# Patient Record
Sex: Male | Born: 1955 | Race: White | Hispanic: No | Marital: Married | Smoking: Current every day smoker
Health system: Southern US, Community
[De-identification: ages and names within clinical notes are randomized; demographics above are authoritative.]

## PROBLEM LIST (undated history)

## (undated) ENCOUNTER — Emergency Department (HOSPITAL_COMMUNITY): Admission: EM | Payer: Medicare Other | Source: Home / Self Care

## (undated) DIAGNOSIS — G8929 Other chronic pain: Secondary | ICD-10-CM

## (undated) DIAGNOSIS — M1990 Unspecified osteoarthritis, unspecified site: Secondary | ICD-10-CM

## (undated) DIAGNOSIS — J189 Pneumonia, unspecified organism: Secondary | ICD-10-CM

## (undated) DIAGNOSIS — R0600 Dyspnea, unspecified: Secondary | ICD-10-CM

## (undated) DIAGNOSIS — T7840XA Allergy, unspecified, initial encounter: Secondary | ICD-10-CM

## (undated) DIAGNOSIS — J449 Chronic obstructive pulmonary disease, unspecified: Secondary | ICD-10-CM

## (undated) DIAGNOSIS — G4700 Insomnia, unspecified: Secondary | ICD-10-CM

## (undated) DIAGNOSIS — I10 Essential (primary) hypertension: Secondary | ICD-10-CM

## (undated) DIAGNOSIS — E785 Hyperlipidemia, unspecified: Secondary | ICD-10-CM

## (undated) DIAGNOSIS — K219 Gastro-esophageal reflux disease without esophagitis: Secondary | ICD-10-CM

## (undated) HISTORY — PX: TOE SURGERY: SHX1073

## (undated) HISTORY — DX: Essential (primary) hypertension: I10

## (undated) HISTORY — PX: SHOULDER SURGERY: SHX246

## (undated) HISTORY — DX: Hyperlipidemia, unspecified: E78.5

## (undated) HISTORY — DX: Insomnia, unspecified: G47.00

## (undated) HISTORY — DX: Other chronic pain: G89.29

## (undated) HISTORY — DX: Allergy, unspecified, initial encounter: T78.40XA

## (undated) HISTORY — DX: Gastro-esophageal reflux disease without esophagitis: K21.9

---

## 1998-05-23 ENCOUNTER — Encounter: Admission: RE | Admit: 1998-05-23 | Discharge: 1998-08-21 | Payer: Self-pay | Admitting: Family Medicine

## 2002-07-17 ENCOUNTER — Emergency Department (HOSPITAL_COMMUNITY): Admission: EM | Admit: 2002-07-17 | Discharge: 2002-07-17 | Payer: Self-pay | Admitting: *Deleted

## 2002-10-26 ENCOUNTER — Encounter: Payer: Self-pay | Admitting: Emergency Medicine

## 2002-10-26 ENCOUNTER — Emergency Department (HOSPITAL_COMMUNITY): Admission: EM | Admit: 2002-10-26 | Discharge: 2002-10-26 | Payer: Self-pay | Admitting: Emergency Medicine

## 2003-07-27 ENCOUNTER — Emergency Department (HOSPITAL_COMMUNITY): Admission: EM | Admit: 2003-07-27 | Discharge: 2003-07-27 | Payer: Self-pay | Admitting: Emergency Medicine

## 2004-01-19 ENCOUNTER — Emergency Department (HOSPITAL_COMMUNITY): Admission: EM | Admit: 2004-01-19 | Discharge: 2004-01-19 | Payer: Self-pay | Admitting: Emergency Medicine

## 2004-04-30 ENCOUNTER — Emergency Department (HOSPITAL_COMMUNITY): Admission: EM | Admit: 2004-04-30 | Discharge: 2004-04-30 | Payer: Self-pay | Admitting: Emergency Medicine

## 2005-08-16 ENCOUNTER — Emergency Department (HOSPITAL_COMMUNITY): Admission: EM | Admit: 2005-08-16 | Discharge: 2005-08-16 | Payer: Self-pay | Admitting: Emergency Medicine

## 2007-04-21 ENCOUNTER — Emergency Department (HOSPITAL_COMMUNITY): Admission: EM | Admit: 2007-04-21 | Discharge: 2007-04-21 | Payer: Self-pay | Admitting: Emergency Medicine

## 2009-01-19 ENCOUNTER — Emergency Department (HOSPITAL_BASED_OUTPATIENT_CLINIC_OR_DEPARTMENT_OTHER): Admission: EM | Admit: 2009-01-19 | Discharge: 2009-01-19 | Payer: Self-pay | Admitting: Emergency Medicine

## 2009-02-07 ENCOUNTER — Ambulatory Visit: Payer: Self-pay | Admitting: Radiology

## 2009-02-07 ENCOUNTER — Emergency Department (HOSPITAL_BASED_OUTPATIENT_CLINIC_OR_DEPARTMENT_OTHER): Admission: EM | Admit: 2009-02-07 | Discharge: 2009-02-07 | Payer: Self-pay | Admitting: Emergency Medicine

## 2009-03-24 ENCOUNTER — Emergency Department (HOSPITAL_BASED_OUTPATIENT_CLINIC_OR_DEPARTMENT_OTHER): Admission: EM | Admit: 2009-03-24 | Discharge: 2009-03-24 | Payer: Self-pay | Admitting: Emergency Medicine

## 2009-04-20 ENCOUNTER — Emergency Department (HOSPITAL_COMMUNITY): Admission: EM | Admit: 2009-04-20 | Discharge: 2009-04-20 | Payer: Self-pay | Admitting: Family Medicine

## 2009-04-28 ENCOUNTER — Emergency Department (HOSPITAL_COMMUNITY): Admission: EM | Admit: 2009-04-28 | Discharge: 2009-04-28 | Payer: Self-pay | Admitting: Emergency Medicine

## 2009-05-28 ENCOUNTER — Emergency Department (HOSPITAL_BASED_OUTPATIENT_CLINIC_OR_DEPARTMENT_OTHER): Admission: EM | Admit: 2009-05-28 | Discharge: 2009-05-28 | Payer: Self-pay | Admitting: Emergency Medicine

## 2009-06-12 DEATH — deceased

## 2009-06-20 ENCOUNTER — Ambulatory Visit: Payer: Self-pay | Admitting: Family Medicine

## 2009-07-01 ENCOUNTER — Encounter: Payer: Self-pay | Admitting: Family Medicine

## 2009-07-01 ENCOUNTER — Ambulatory Visit: Payer: Self-pay | Admitting: Family Medicine

## 2009-07-01 LAB — CONVERTED CEMR LAB
ALT: 28 units/L (ref 0–53)
AST: 22 units/L (ref 0–37)
Albumin: 4.2 g/dL (ref 3.5–5.2)
Alkaline Phosphatase: 92 units/L (ref 39–117)
BUN: 16 mg/dL (ref 6–23)
CO2: 27 meq/L (ref 19–32)
Calcium: 9.4 mg/dL (ref 8.4–10.5)
Chloride: 102 meq/L (ref 96–112)
Cholesterol: 234 mg/dL — ABNORMAL HIGH (ref 0–200)
Creatinine, Ser: 1.12 mg/dL (ref 0.40–1.50)
Glucose, Bld: 99 mg/dL (ref 70–99)
HCT: 41 % (ref 39.0–52.0)
HDL: 36 mg/dL — ABNORMAL LOW (ref >39)
Hemoglobin: 14 g/dL (ref 13.0–17.0)
MCHC: 34.1 g/dL (ref 30.0–36.0)
MCV: 97.4 fL (ref 78.0–100.0)
Platelets: 174 10*3/uL (ref 150–400)
Potassium: 4.8 meq/L (ref 3.5–5.3)
RBC: 4.21 M/uL — ABNORMAL LOW (ref 4.22–5.81)
RDW: 13.3 % (ref 11.5–15.5)
Sodium: 140 meq/L (ref 135–145)
Total Bilirubin: 0.5 mg/dL (ref 0.3–1.2)
Total CHOL/HDL Ratio: 6.5
Total Protein: 6.6 g/dL (ref 6.0–8.3)
Triglycerides: 493 mg/dL — ABNORMAL HIGH (ref <150)
WBC: 3.8 10*3/uL — ABNORMAL LOW (ref 4.0–10.5)

## 2009-07-14 ENCOUNTER — Ambulatory Visit: Payer: Self-pay | Admitting: Family Medicine

## 2009-07-22 ENCOUNTER — Ambulatory Visit (HOSPITAL_COMMUNITY): Admission: RE | Admit: 2009-07-22 | Discharge: 2009-07-22 | Payer: Self-pay | Admitting: Family Medicine

## 2009-07-22 ENCOUNTER — Ambulatory Visit: Payer: Self-pay | Admitting: Family Medicine

## 2009-08-10 ENCOUNTER — Ambulatory Visit: Payer: Self-pay | Admitting: Family Medicine

## 2009-08-17 ENCOUNTER — Ambulatory Visit: Payer: Self-pay | Admitting: Family Medicine

## 2009-09-04 ENCOUNTER — Ambulatory Visit: Payer: Self-pay | Admitting: Radiology

## 2009-09-04 ENCOUNTER — Emergency Department (HOSPITAL_BASED_OUTPATIENT_CLINIC_OR_DEPARTMENT_OTHER): Admission: EM | Admit: 2009-09-04 | Discharge: 2009-09-04 | Payer: Self-pay | Admitting: Emergency Medicine

## 2009-10-02 ENCOUNTER — Emergency Department (HOSPITAL_BASED_OUTPATIENT_CLINIC_OR_DEPARTMENT_OTHER): Admission: EM | Admit: 2009-10-02 | Discharge: 2009-10-02 | Payer: Self-pay | Admitting: Emergency Medicine

## 2009-10-04 ENCOUNTER — Emergency Department (HOSPITAL_COMMUNITY): Admission: EM | Admit: 2009-10-04 | Discharge: 2009-10-04 | Payer: Self-pay | Admitting: Emergency Medicine

## 2009-10-20 ENCOUNTER — Encounter
Admission: RE | Admit: 2009-10-20 | Discharge: 2009-11-02 | Payer: Self-pay | Admitting: Physical Medicine & Rehabilitation

## 2009-10-21 ENCOUNTER — Ambulatory Visit: Payer: Self-pay | Admitting: Physical Medicine & Rehabilitation

## 2009-10-25 ENCOUNTER — Ambulatory Visit (HOSPITAL_COMMUNITY)
Admission: RE | Admit: 2009-10-25 | Discharge: 2009-10-25 | Payer: Self-pay | Admitting: Physical Medicine & Rehabilitation

## 2009-11-24 ENCOUNTER — Encounter
Admission: RE | Admit: 2009-11-24 | Discharge: 2010-02-22 | Payer: Self-pay | Admitting: Physical Medicine & Rehabilitation

## 2009-11-25 ENCOUNTER — Ambulatory Visit: Payer: Self-pay | Admitting: Physical Medicine & Rehabilitation

## 2009-12-20 ENCOUNTER — Ambulatory Visit: Payer: Self-pay | Admitting: Physical Medicine & Rehabilitation

## 2010-01-08 ENCOUNTER — Emergency Department (HOSPITAL_BASED_OUTPATIENT_CLINIC_OR_DEPARTMENT_OTHER): Admission: EM | Admit: 2010-01-08 | Discharge: 2010-01-08 | Payer: Self-pay | Admitting: Emergency Medicine

## 2010-01-09 ENCOUNTER — Emergency Department (HOSPITAL_BASED_OUTPATIENT_CLINIC_OR_DEPARTMENT_OTHER): Admission: EM | Admit: 2010-01-09 | Discharge: 2010-01-09 | Payer: Self-pay | Admitting: Emergency Medicine

## 2010-02-23 ENCOUNTER — Ambulatory Visit: Payer: Self-pay | Admitting: Family Medicine

## 2010-02-23 ENCOUNTER — Encounter: Payer: Self-pay | Admitting: Family Medicine

## 2010-02-23 LAB — CONVERTED CEMR LAB
ALT: 19 units/L (ref 0–53)
AST: 17 units/L (ref 0–37)
Albumin: 4.2 g/dL (ref 3.5–5.2)
Alkaline Phosphatase: 85 units/L (ref 39–117)
BUN: 17 mg/dL (ref 6–23)
CO2: 25 meq/L (ref 19–32)
Calcium: 8.8 mg/dL (ref 8.4–10.5)
Chloride: 102 meq/L (ref 96–112)
Creatinine, Ser: 1.06 mg/dL (ref 0.40–1.50)
Glucose, Bld: 126 mg/dL — ABNORMAL HIGH (ref 70–99)
Potassium: 4.1 meq/L (ref 3.5–5.3)
Sodium: 138 meq/L (ref 135–145)
Total Bilirubin: 0.4 mg/dL (ref 0.3–1.2)
Total Protein: 6.3 g/dL (ref 6.0–8.3)

## 2010-03-30 ENCOUNTER — Ambulatory Visit: Payer: Self-pay | Admitting: Family Medicine

## 2010-04-24 ENCOUNTER — Ambulatory Visit: Payer: Self-pay | Admitting: Family Medicine

## 2010-05-16 ENCOUNTER — Ambulatory Visit: Payer: Self-pay | Admitting: Family Medicine

## 2010-06-14 ENCOUNTER — Ambulatory Visit: Payer: Self-pay | Admitting: Family Medicine

## 2010-07-12 ENCOUNTER — Encounter: Payer: Self-pay | Admitting: Family Medicine

## 2010-07-12 ENCOUNTER — Ambulatory Visit: Payer: Self-pay | Admitting: Family Medicine

## 2010-07-13 LAB — CONVERTED CEMR LAB
Cholesterol: 188 mg/dL (ref 0–200)
HDL: 34 mg/dL — ABNORMAL LOW (ref >39)
LDL Cholesterol: 103 mg/dL — ABNORMAL HIGH (ref 0–99)
Total CHOL/HDL Ratio: 5.5
Triglycerides: 254 mg/dL — ABNORMAL HIGH (ref <150)
VLDL: 51 mg/dL — ABNORMAL HIGH (ref 0–40)

## 2010-08-11 ENCOUNTER — Ambulatory Visit: Payer: Self-pay | Admitting: Family Medicine

## 2010-09-06 ENCOUNTER — Ambulatory Visit: Payer: Self-pay | Admitting: Family Medicine

## 2010-11-29 ENCOUNTER — Ambulatory Visit: Admission: RE | Admit: 2010-11-29 | Discharge: 2010-11-29 | Payer: Self-pay | Source: Home / Self Care

## 2010-12-12 NOTE — Assessment & Plan Note (Signed)
Summary: cough/[place]   Vital Signs:  Patient profile:   55 year old male Weight:      220 pounds O2 Sat:      95 % Temp:     98.3 degrees F Pulse rate:   105 / minute BP sitting:   148 / 99  Vitals Entered By: Lillia Pauls CMA (August 17, 2009 9:53 AM)  Primary Care Provider:  Helane Rima DO  CC:  cough.  History of Present Illness: 55 yo WM with almost 40 pack year history presents with c/o cough.  1. Cough: worse last 3 days, productive of white sputum, associated with subjective fever/chills, HA, some wheezing. he denies nasal congestion, dyspnea, sore throat, N/V/D, body aches. he is smoking  ~3 cigs/day since he isn't feeling well. he has been Rx inhalers in the past but refuses to use them so he is unsure what medicines they were. he endorses a history of chronic bronchitis. he denies hospital admission for this. no history of intubation. he has been using Robitussin and cough drops but cough still keeping him awake at night so he requests "something with codeine."   2. HTN: BP 148/99 today. He has not been taking prescribed medications.   Habits & Providers  Alcohol-Tobacco-Diet     Tobacco Status: current     Tobacco Counseling: to quit use of tobacco products     Year Started: age 72     Pack years: 46  Current Medications (verified): 1)  Mobic 7.5 Mg Tabs (Meloxicam) .... One By Mouth Daily 2)  Nortriptyline Hcl 50 Mg Caps (Nortriptyline Hcl) .... One By Mouth Q Hs 3)  Hydrocodone-Acetaminophen 5-500 Mg Tabs (Hydrocodone-Acetaminophen) .... One By Mouth Two Times A Day - With Instructions To Wean 4)  Lisinopril 20 Mg Tabs (Lisinopril) .... 1/2 Tablet By Mouth Daily 5)  Lovastatin 20 Mg Tabs (Lovastatin) .... One By Mouth Daily 6)  Nystatin 100000 Unit/gm Crea (Nystatin) .... Apply To Affected Area (Groin) Two Times A Day For Rash 7)  Flexeril 10 Mg Tabs (Cyclobenzaprine Hcl) .... One By Mouth Three Times A Day For Muscle Spasm 8)  Requip 0.5 Mg Tabs (Ropinirole Hcl)  .... One Half Tab By Mouth Q Pm X 2 Days, Then 1 Tab By Mouth Q Pm 9)  Doxycycline Hyclate 100 Mg Caps (Doxycycline Hyclate) .... Take 1 Tab Twice A Day 10)  Prednisone 20 Mg Tabs (Prednisone) .... Two By Mouth Daily X 5 Days 11)  Tessalon Perles 100 Mg Caps (Benzonatate) .Marland Kitchen.. 1-2 By Mouth Three Times A Day As Needed For Cough 12)  Ventolin Hfa 108 (90 Base) Mcg/act Aers (Albuterol Sulfate) .... 2 Puffs Q 4-6 Hours As Needed Wheeze  Allergies (verified): No Known Drug Allergies  Past History:  Past medical, surgical, family and social histories (including risk factors) reviewed, and no changes noted (except as noted below).  Past Medical History: Back pain Hx of kidney stone Arthritis in multiple joints Right rotator cuff injury COPD Tobacco Abuse HTN  Family History: Reviewed history from 06/20/2009 and no changes required. Family History Diabetes 1st degree relative- brother Twin brother died 2009-03-31 from suspected overdose.  Social History: Reviewed history from 06/20/2009 and no changes required. Lives with wife and daughter born in 64.   Pt is currently unemployeed.  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 Smokes 1 ppd x 40 years, and drinks approximately 3 beers per day.  Uses no illicit drugs.  Walks or  swims for exercise. He is primary caregiver for wife (has ovarian Ca, Dx in 1995, chemo at [facility]).  Review of Systems       The patient complains of fever, dyspnea on exertion, prolonged cough, and headaches.  The patient denies weight loss, weight gain, hoarseness, chest pain, syncope, peripheral edema, hemoptysis, abdominal pain, and severe indigestion/heartburn.    Physical Exam  General:  Well-developed,well-nourished, in no acute distress; alert, appropriate and cooperative throughout examination. Vital signs reviewed. Lungs:  coarse breath sounds bilaterally with scattered wheeze Heart:  RRR no m/r/g Abdomen:  soft, non-tender, and  normal bowel sounds.   Extremities:  no edema Psych:  Oriented X3, memory intact for recent and remote, and good eye contact.     Impression & Recommendations:  Problem # 1:  CHRONIC OBSTRUCTIVE PULMONARY DISEASE, ACUTE EXACERBATION (ICD-491.21) Assessment New  Will treat patient for acute COPD exacerbation with prednisone, doxycycline, albuterol, and tessalon perles. Encouraged patient to stop smoking. He refuses to use an inhaler at this time. We discussed the benefits versus risks of these medications at length, but he still refused. He stated that "it wasn't right letting that chemical in your lungs.'' Instructed patient to f/u in pharmacy clinic for PFTs.  Orders: [facility]- Est Level  3 (93235)  Problem # 2:  ESSENTIAL HYPERTENSION, BENIGN (ICD-401.1) Assessment: Deteriorated  Instructed patient to restart medicine. His updated medication list for this problem includes:    Lisinopril 20 Mg Tabs (Lisinopril) .Marland Kitchen... 1/2 tablet by mouth daily  Orders: [facility]- Est Level  3 (57322)  Complete Medication List: 1)  Mobic 7.5 Mg Tabs (Meloxicam) .... One by mouth daily 2)  Nortriptyline Hcl 50 Mg Caps (Nortriptyline hcl) .... One by mouth q hs 3)  Hydrocodone-acetaminophen 5-500 Mg Tabs (Hydrocodone-acetaminophen) .... One by mouth two times a day - with instructions to wean 4)  Lisinopril 20 Mg Tabs (Lisinopril) .... 1/2 tablet by mouth daily 5)  Lovastatin 20 Mg Tabs (Lovastatin) .... One by mouth daily 6)  Nystatin 100000 Unit/gm Crea (Nystatin) .... Apply to affected area (groin) two times a day for rash 7)  Flexeril 10 Mg Tabs (Cyclobenzaprine hcl) .... One by mouth three times a day for muscle spasm 8)  Requip 0.5 Mg Tabs (Ropinirole hcl) .... One half tab by mouth q pm x 2 days, then 1 tab by mouth q pm 9)  Doxycycline Hyclate 100 Mg Caps (Doxycycline hyclate) .... Take 1 tab twice a day 10)  Prednisone 20 Mg Tabs (Prednisone) .... Two by mouth daily x 5 days 11)  Tessalon Perles 100 Mg  Caps (Benzonatate) .Marland Kitchen.. 1-2 by mouth three times a day as needed for cough 12)  Ventolin Hfa 108 (90 Base) Mcg/act Aers (Albuterol sulfate) .... 2 puffs q 4-6 hours as needed wheeze  Patient Instructions: 1)  It looks like you have COPD and are having an exacerbation of this. Please take all medications as prescribed. 2)  Please make an appointment for the pharmacy clinic to get pulmonary function tests done. Prescriptions: VENTOLIN HFA 108 (90 BASE) MCG/ACT AERS (ALBUTEROL SULFATE) 2 puffs q 4-6 hours as needed wheeze  #1 x 3   Entered and Authorized by:   Helane Rima MD   Signed by:   Helane Rima MD on 08/17/2009   Method used:   Print then Give to Patient   RxID:   [id number] TESSALON PERLES 100 MG CAPS (BENZONATATE) 1-2 by mouth three times a day as needed for cough  #30 x 0  Entered and Authorized by:   Helane Rima MD   Signed by:   Helane Rima MD on 08/17/2009   Method used:   Print then Give to Patient   RxID:   [id number] PREDNISONE 20 MG TABS (PREDNISONE) two by mouth daily x 5 days  #10 x 0   Entered and Authorized by:   Helane Rima MD   Signed by:   Helane Rima MD on 08/17/2009   Method used:   Print then Give to Patient   RxID:   [id number] DOXYCYCLINE HYCLATE 100 MG CAPS (DOXYCYCLINE HYCLATE) Take 1 tab twice a day  #20 x 0   Entered and Authorized by:   Helane Rima MD   Signed by:   Helane Rima MD on 08/17/2009   Method used:   Print then Give to Patient   RxID:   [id number]   Prevention & Chronic Care Immunizations   Influenza vaccine: Not documented    Tetanus booster: Not documented    Pneumococcal vaccine: Not documented  Colorectal Screening   Hemoccult: Not documented    Colonoscopy: Not documented  Other Screening   PSA: Not documented   Smoking status: current  (08/17/2009)  Lipids   Total Cholesterol: 234  (07/01/2009)   LDL: See Comment mg/dL  (11/14/7251)   LDL Direct: Not documented   HDL: 36   (07/01/2009)   Triglycerides: 493  (07/01/2009)    SGOT (AST): 22  (07/01/2009)   SGPT (ALT): 28  (07/01/2009)   Alkaline phosphatase: 92  (07/01/2009)   Total bilirubin: 0.5  (07/01/2009)    Lipid flowsheet reviewed?: Yes   Progress toward LDL goal: Unchanged  Hypertension   Last Blood Pressure: 148 / 99  (08/17/2009)   Serum creatinine: 1.12  (07/01/2009)   Serum potassium 4.8  (07/01/2009)    Hypertension flowsheet reviewed?: Yes   Progress toward BP goal: Deteriorated  Self-Management Support :   Personal Goals (by the next clinic visit) :      Personal blood pressure goal: 140/90  (07/14/2009)     Personal LDL goal: 130  (07/14/2009)    Patient will work on the following items until the next clinic visit to reach self-care goals:     Medications and monitoring: take my medicines every day, bring all of my medications to every visit  (08/17/2009)     Eating: drink diet soda or water instead of juice or soda, eat more vegetables, use fresh or frozen vegetables, eat foods that are low in salt, eat baked foods instead of fried foods, eat fruit for snacks and desserts, limit or avoid alcohol  (08/17/2009)     Activity: take a 30 minute walk every day  (08/17/2009)    Hypertension self-management support: Written self-care plan  (08/17/2009)   Hypertension self-care plan printed.    Lipid self-management support: Written self-care plan  (08/17/2009)   Lipid self-care plan printed.

## 2010-12-12 NOTE — Assessment & Plan Note (Signed)
Summary: FU MEDS/KH   Vital Signs:  Patient profile:   54 year old male Weight:      211.5 pounds Pulse rate:   72 / minute BP sitting:   123 / 83  (right arm)  Vitals Entered By: Arlyss Repress CMA, (April 24, 2010 1:59 PM) CC: refill meds. pain meds. back pain. Is Patient Diabetic? No Pain Assessment Patient in pain? yes     Location: back Intensity: 5 Onset of pain  Chronic   Primary Care Provider:  Helane Rima DO  CC:  refill meds. pain meds. back pain.Marland Kitchen  History of Present Illness: 55 year old male: (relatively unchanged from last visit)  1. Grief: Patient's wife actively now dying from endometrial cancer. She has home hospice. He is connected with bereavement counseling. He in no longer participating in classes 2/2 needing to be with his wife. No SI/HI.  2. Back Pain: History: He states that he has tried ibuprofen, naproxyn, ultram, flexeril, SOMA, and neurontin but they all made his "legs twitch." he states that PT "didn't help." endorses pain down bilateral legs. denies bowel or bladder incontinence, recent trauma, prolonged corticosteroid use.  states that Mobic helps some, but Vicodin is the only thing that really controls the pain. Xray lumbar spine: no evidence of acute bony abnormality or significant degenerative change.  there are small osteophytes extending from the vertebral bodies of the lumbar spine. patient was sent to Pain Clinic for evaluation and treatment. review of Pain Clinic notes: he had an EMG showing neuropathy. he was Dx with Myofascial pain syndrome. 2. Lumbar spondylosis. 3. Polyneuropathy.  Tx Mobic, Vicodin 5/325 three times a day.   3. HTN: Rx: Lisinopril. Denies CP, SOB, N/V/D, LE edema, HA, dizziness, vision changes.  Habits & Providers  Alcohol-Tobacco-Diet     Tobacco Status: current     Tobacco Counseling: to quit use of tobacco products     Cigarette Packs/Day: 1.0  Current Medications (verified): 1)  Mobic 15 Mg Tabs (Meloxicam) 2)   Vicodin 5-500 Mg Tabs (Hydrocodone-Acetaminophen) .... One By Mouth Three Times A Day As Needed Pain 3)  Lisinopril 20 Mg Tabs (Lisinopril) .Marland Kitchen.. 1 Tablet By Mouth Daily 4)  Simvastatin 20 Mg Tabs (Simvastatin) .Marland Kitchen.. 1 By Mouth At Bedtime 5)  Ventolin Hfa 108 (90 Base) Mcg/act Aers (Albuterol Sulfate) .... 2 Puffs Q 4-6 Hours As Needed Wheeze 6)  Trazodone Hcl 50 Mg Tabs (Trazodone Hcl) .... 1/2 By Mouth At Bedtime As Needed Insomnia  Allergies (verified): No Known Drug Allergies PMH-FH-SH reviewed for relevance  Review of Systems      See HPI  Physical Exam  General:  Well-developed,well-nourished, in no acute distress; alert, appropriate and cooperative throughout examination. Vital signs reviewed. Lungs:  Decreased breath sounds bilaterally. Heart:  RRR no m/r/g. Msk:  Back: FROM. negative SLR, slight ttp along spinus processes of lumbar spine, hypertonic lumbar paraspinal mm, good strength LE, normal reflexes. Decreased lumbar lordosis.  Pulses:  2+ DP. Extremities:  No edema. Neurologic:  Alert & oriented X3, cranial nerves II-XII intact, strength normal in all extremities, sensation intact to light touch, gait normal, and DTRs symmetrical and normal.   Psych:  Oriented X3, memory intact for recent and remote, normally interactive, good eye contact, not anxious appearing, and tearful when talking about his wife.     Impression & Recommendations:  Problem # 1:  GRIEF REACTION (ICD-309.0) Assessment Unchanged  Patient has support through [facility] and family. No SI/HI. He becomes tearful when  discussing her, but does seem comfortable discussing his feelings. Will follow closely. I asked him to let me know if she passes before our next visit.  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 1 tablet by mouth daily  Orders: [facility]- Est  Level 4 (99214)  Complete Medication List: 1)  Mobic 15 Mg Tabs (Meloxicam) 2)  Vicodin 5-500 Mg Tabs (Hydrocodone-acetaminophen) .... One by mouth three times a day as needed pain 3)  Lisinopril 20 Mg Tabs (Lisinopril) .Marland Kitchen.. 1 tablet by mouth daily 4)  Simvastatin 20 Mg Tabs (Simvastatin) .Marland Kitchen.. 1 by mouth at bedtime 5)  Ventolin Hfa 108 (90 Base) Mcg/act Aers (Albuterol sulfate) .... 2 puffs q 4-6 hours as needed wheeze 6)  Trazodone Hcl 50 Mg Tabs (Trazodone hcl) .... 1/2 by mouth at bedtime as needed insomnia  Patient Instructions: 1)  It was nice to see you again. 2)  I am sorry to hear about your wife. Please keep me updated. Prescriptions: VICODIN 5-500 MG TABS (HYDROCODONE-ACETAMINOPHEN) one by mouth three times a day as needed pain  #90 x 0   Entered and Authorized by:   Helane Rima DO   Signed by:   Helane Rima DO on 04/24/2010   Method used:   Print then Give to Patient   RxID:   [id number]   Prevention & Chronic Care Immunizations   Influenza vaccine: Not documented   Influenza vaccine deferral: Not indicated  (02/23/2010)    Tetanus booster: Not documented    Pneumococcal vaccine: Not documented  Colorectal Screening   Hemoccult: Not documented   Hemoccult action/deferral: Not indicated  (02/23/2010)    Colonoscopy: Not documented   Colonoscopy action/deferral: Refused  (03/30/2010)  Other Screening   PSA: Not documented   PSA action/deferral: Discussion deferred  (02/23/2010)   Smoking status: current   (04/24/2010)   Smoking cessation counseling: yes  (08/10/2009)  Lipids   Total Cholesterol: 234  (07/01/2009)   LDL: See Comment mg/dL  (69/62/9528)   LDL Direct: Not documented   HDL: 36  (07/01/2009)   Triglycerides: 493  (07/01/2009)    SGOT (AST): 17  (02/23/2010)   SGPT (ALT): 19  (02/23/2010)   Alkaline phosphatase: 85  (02/23/2010)   Total bilirubin: 0.4  (02/23/2010)    Lipid flowsheet reviewed?: Yes   Progress toward LDL goal: Unchanged  Hypertension   Last Blood Pressure: 123 / 83  (04/24/2010)   Serum creatinine: 1.06  (02/23/2010)   Serum potassium 4.1  (02/23/2010)    Hypertension flowsheet reviewed?: Yes   Progress toward BP goal: At goal  Self-Management Support :   Personal Goals (by the next clinic visit) :      Personal blood pressure goal: 140/90  (07/14/2009)     Personal LDL goal: 130  (07/14/2009)    Patient will work on the following items until the next clinic visit to reach self-care goals:  Medications and monitoring: take my medicines every day, bring all of my medications to every visit  (04/24/2010)     Eating: drink diet soda or water instead of juice or soda, eat more vegetables, use fresh or frozen vegetables, eat foods that are low in salt, eat baked foods instead of fried foods, eat fruit for snacks and desserts, limit or avoid alcohol  (04/24/2010)     Activity: take a 30 minute walk every day, take the stairs instead of the elevator, park at the far end of the parking lot  (04/24/2010)    Hypertension self-management support: Written self-care plan  (04/24/2010)   Hypertension self-care plan printed.    Lipid self-management support: Written self-care plan  (04/24/2010)   Lipid self-care plan printed.

## 2010-12-12 NOTE — Assessment & Plan Note (Signed)
Summary: NP,TCB   Vital Signs:  Patient profile:   55 year old male Height:      72.5 inches Weight:      222 pounds BMI:     29.80 Temp:     98.4 degrees F oral Pulse rate:   73 / minute Pulse rhythm:   regular BP sitting:   152 / 92  (left arm)  Vitals Entered By: Modesta Messing LPN (June 20, 2009 2:03 PM) CC: Back pain.  New patient. Is Patient Diabetic? No Pain Assessment Patient in pain? yes     Location: back Intensity: 8 Type: aching Onset of pain  Chronic   Primary Care Provider:  Helane Rima DO  CC:  Back pain.  New patient..  History of Present Illness: Todd Hayes is a 55 year old male presenting for a new patient exam. He would like to discuss his chronic back pain today.  1. Back Pain: x several years, has been told before that it is due to OA, was on Vioxx for a while. he states that he has tried ibuprofen, naproxyn, ultraml, flexeril, SOMA, and neurontin but they all made his "legs twitch." he states that PT "didn't help." he requests Rx vicodin or percocet. endorses pain down bilateral legs. denies bowel or bladder incontinence, recent trauma, prolonged corticosteroid use, weakness/numbness/tingling in LE.   Current Medications (verified): 1)  Mobic 7.5 Mg Tabs (Meloxicam) .... One By Mouth Daily 2)  Nortriptyline Hcl 50 Mg Caps (Nortriptyline Hcl) .... One By Mouth Q Hs 3)  Vicodin 5-500 Mg Tabs (Hydrocodone-Acetaminophen) .... One By Mouth At [facility]  Allergies (verified): No Known Drug Allergies  Past History:  Past Medical History: Back pain Hx of kidney stone Arthritis in multiple joints Right rotator cuff injury  Family History: Family History Diabetes 1st degree relative- brother Twin brother died 2009/03/31 from suspected overdose.  Social History: Lives with wife and daughter born in 85.   Pt is currently unemployeed.  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 Smokes 1 ppd x 35 years, and  drinks approximately 3 beers per day.  Uses no illicit drugs.  Walks or swims for exercise. He is primary caregiver for wife (has ovarian Ca, Dx in Mar 31, 1994, chemo at [facility]).  Review of Systems       per HPI, otherwise negative   Impression & Recommendations:  Problem # 1:  BACK PAIN (ICD-724.5) No RED FLAGs. Back pain likely due to OA. Patient endorses side effects to many medications and requests Vicodin or Percocet. Spent time educating patient re: OA and treatment. Rx: Mobic with small amount of Vicodin with NO REFILLS. If patient has no relief from pain with Mobic, will send to PT next.   His updated medication list for this problem includes:    Mobic 7.5 Mg Tabs (Meloxicam) ..... One by mouth daily    Vicodin 5-500 Mg Tabs (Hydrocodone-acetaminophen) ..... One by mouth at hs for severe pain  Problem # 2:  TOBACCO ABUSE (ICD-305.1) Assessment: New Patient is not willing to quit at this time. Will continue to monitor and encourage cessation.  Problem # 3:  ELEVATED BLOOD PRESSURE WITHOUT DIAGNOSIS OF HYPERTENSION (ICD-796.2) Assessment: New Possibly secondary to pain, so will not start medication at this time. Will check labs (future order so that the patient may come in fasting)in order to start medication at next visit if needed.  Future Orders: Comp Met-[facility] [id number]) ... 06/16/2010 Lipid-[facility] ([id number]) .Marland KitchenMarland Kitchen  06/24/2010  Complete Medication List: 1)  Mobic 7.5 Mg Tabs (Meloxicam) .... One by mouth daily 2)  Nortriptyline Hcl 50 Mg Caps (Nortriptyline hcl) .... One by mouth q hs 3)  Vicodin 5-500 Mg Tabs (Hydrocodone-acetaminophen) .... One by mouth at hs for severe pain  Other Orders: Future Orders: CBC-[facility] ([id number]) ... 06/30/2010  Patient Instructions: 1)  It was nice to meet you today! 2)  I am prescribing 3 medications today to help with your back pain and 1 for your blood pressure. 3)  Please follow up in 2-4 weeks to evaluate your pain. We will also recheck your blood  pressure at that time. Prescriptions: LISINOPRIL 10 MG  TABS (LISINOPRIL) Take 1 tab by mouth daily  #90 x 3   Entered and Authorized by:   Helane Rima MD   Signed by:   Helane Rima MD on 06/20/2009   Method used:   Print then Give to Patient   RxID:   [id number] VICODIN 5-500 MG TABS (HYDROCODONE-ACETAMINOPHEN) one by mouth at hs for severe pain  #20 x 0   Entered and Authorized by:   Helane Rima MD   Signed by:   Helane Rima MD on 06/20/2009   Method used:   Print then Give to Patient   RxID:   [id number] NORTRIPTYLINE HCL 50 MG CAPS (NORTRIPTYLINE HCL) one by mouth q hs  #30 x 3   Entered and Authorized by:   Helane Rima MD   Signed by:   Helane Rima MD on 06/20/2009   Method used:   Print then Give to Patient   RxID:   [id number] MOBIC 7.5 MG TABS (MELOXICAM) one by mouth daily  #30 x 3   Entered and Authorized by:   Helane Rima MD   Signed by:   Helane Rima MD on 06/20/2009   Method used:   Print then Give to Patient   RxID:   [id number]   Appended Document: NP,TCB     Allergies: No Known Drug Allergies  Physical Exam  General:  Well-developed,well-nourished, in no acute distress; alert, appropriate and cooperative throughout examination. Vital signs reviewed. Lungs:  CTAB no wheeze. Heart:  RRR. Msk:  Back: Decreased flexion and extension due to pain, negative SLR, ttp along spinus processes lumbar spine, hypertonic lumbar paraspinal mm, good strength LE, normal reflexes. Pulses:  2+ dp. Extremities:  No edema.   Complete Medication List: 1)  Mobic 7.5 Mg Tabs (Meloxicam) .... One by mouth daily 2)  Nortriptyline Hcl 50 Mg Caps (Nortriptyline hcl) .... One by mouth q hs 3)  Vicodin 5-500 Mg Tabs (Hydrocodone-acetaminophen) .... One by mouth at hs for severe pain 4)  Lisinopril 20 Mg Tabs (Lisinopril) .Marland Kitchen.. 1 tablet by mouth daily

## 2010-12-12 NOTE — Assessment & Plan Note (Signed)
Summary: F/U/KH   Vital Signs:  Patient profile:   55 year old male Height:      72 inches Weight:      218 pounds BMI:     29.67 Temp:     98.4 degrees F oral Pulse rate:   76 / minute BP sitting:   117 / 69 Cuff size:   regular  Vitals Entered By: Tessie Fass CMA (August 11, 2010 1:54 PM) CC: back pain, HLD, rhinitis, HTN, insomnia Is Patient Diabetic? No Pain Assessment Patient in pain? yes     Location: lower back Intensity: 6   Primary Care Provider:  Helane Rima DO  CC:  back pain, HLD, rhinitis, HTN, and insomnia.  History of Present Illness: 55 year old male:  1. Back Pain: Pain controlled on current regimen. He is now exercising more. History: He states that he has tried ibuprofen, naproxyn, ultram, flexeril, SOMA, and neurontin but they all made his "legs twitch." he states that PT "didn't help." endorses pain down bilateral legs. denies bowel or bladder incontinence, recent trauma, prolonged corticosteroid use.  states that Mobic helps some, but Vicodin is the only thing that really controls the pain. Xray lumbar spine: no evidence of acute bony abnormality or significant degenerative change.  there are small osteophytes extending from the vertebral bodies of the lumbar spine. patient was sent to Pain Clinic for evaluation and treatment. review of Pain Clinic notes: he had an EMG showing neuropathy. he was Dx with Myofascial pain syndrome. 2. Lumbar spondylosis. 3. Polyneuropathy. Tx Mobic, Vicodin 5/325 three times a day.   2. HLD: Not on medication. Reviewed previous FLP - much improved, but still high triglycerides. Neverstarted previously prescribed Simvastatin, but is now willing to try. 12/2009 CMP WNL.  3. Allergic Rhinitis: Runny nose, nasal congestion, cough, itchy eyes during this allergy season. Not taking Zyrtec consistently but noticing improvement when he does.  4. HTN: Rx: Lisinopril. Denies CP, SOB, N/V/D, LE edema, HA, dizziness, vision  changes.  5. Insomnia: Rx Trazodone 100 mg by mouth q hs and then sometimes 1/2 tab at 4 am if he wake up. He seems to think that this regimen is working for him.  Habits & Providers  Alcohol-Tobacco-Diet     Tobacco Status: current     Tobacco Counseling: to quit use of tobacco products     Cigarette Packs/Day: 1.0  Current Medications (verified): 1)  Mobic 15 Mg Tabs (Meloxicam) .... One By Mouth Daily 2)  Vicodin 5-500 Mg Tabs (Hydrocodone-Acetaminophen) .... One By Mouth Three Times A Day As Needed Pain 3)  Lisinopril 20 Mg Tabs (Lisinopril) .Marland Kitchen.. 1 Tablet By Mouth Daily 4)  Trazodone Hcl 100 Mg Tabs (Trazodone Hcl) .... One By Mouth Q Hs 5)  Zyrtec Allergy 10 Mg  Tabs (Cetirizine Hcl) .Marland Kitchen.. 1 Once Daily Prn 6)  Simvastatin 20 Mg Tabs (Simvastatin) .Marland Kitchen.. 1 By Mouth At Bedtime  Allergies (verified): No Known Drug Allergies PMH-FH-SH reviewed for relevance  Review of Systems      See HPI  Physical Exam  General:  Well-developed,well-nourished, in no acute distress; alert, appropriate and cooperative throughout examination. Vital signs reviewed. Ears:  R ear normal and L ear normal.   Nose:  Nasal discharge, mucosal pallor.   Mouth:  PND. Lungs:  Decreased breath sounds bilaterally. Heart:  RRR no m/r/g. Msk:  Back: FROM. negative SLR, slight ttp along spinus processes of lumbar spine, hypertonic lumbar paraspinal mm, good strength LE, normal reflexes. Decreased lumbar lordosis.  Pulses:  2+ DP. Extremities:  No edema.   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 His updated medication list for this problem includes:    Mobic 15 Mg Tabs (Meloxicam) ..... One by mouth daily    Vicodin 5-500 Mg Tabs (Hydrocodone-acetaminophen) ..... One by mouth three times a day as needed pain  Orders: [facility]- Est  Level 4 (13086)  Problem # 2:  HYPERLIPIDEMIA (ICD-272.4) Assessment: Improved Start  medication. Recheck in 3 months. His updated medication list for this problem includes:    Simvastatin 20 Mg Tabs (Simvastatin) .Marland Kitchen... 1 by mouth at bedtime  Orders: [facility]- Est  Level 4 (57846)  Problem # 3:  ALLERGIC RHINITIS (ICD-477.9) Assessment: Improved  His updated medication list for this problem includes:    Zyrtec Allergy 10 Mg Tabs (Cetirizine hcl) .Marland Kitchen... 1 once daily prn  Orders: [facility]- Est  Level 4 (96295)  Problem # 4:  ESSENTIAL HYPERTENSION, BENIGN (ICD-401.1) Assessment: Improved  His updated medication list for this problem includes:    Lisinopril 20 Mg Tabs (Lisinopril) .Marland Kitchen... 1 tablet by mouth daily  Orders: [facility]- Est  Level 4 (99214)  Problem # 5:  INSOMNIA, CHRONIC (ICD-307.42) Assessment: Improved  Orders: [facility]- Est  Level 4 (99214)  Complete Medication List: 1)  Mobic 15 Mg Tabs (Meloxicam) .... One by mouth daily 2)  Vicodin 5-500 Mg Tabs (Hydrocodone-acetaminophen) .... One by mouth three times a day as needed pain 3)  Lisinopril 20 Mg Tabs (Lisinopril) .Marland Kitchen.. 1 tablet by mouth daily 4)  Trazodone Hcl 100 Mg Tabs (Trazodone hcl) .... One by mouth q hs 5)  Zyrtec Allergy 10 Mg Tabs (Cetirizine hcl) .Marland Kitchen.. 1 once daily prn 6)  Simvastatin 20 Mg Tabs (Simvastatin) .Marland Kitchen.. 1 by mouth at bedtime  Patient Instructions: 1)  It was nice to see you today. 2)  You are due for colonoscopy. Prescriptions: TRAZODONE HCL 100 MG TABS (TRAZODONE HCL) one by mouth q hs  #30 x 3   Entered and Authorized by:   Helane Rima DO   Signed by:   Helane Rima DO on 08/11/2010   Method used:   Print then Give to Patient   RxID:   [id number] SIMVASTATIN 20 MG TABS (SIMVASTATIN) 1 by mouth at bedtime  #90 x 3   Entered and Authorized by:   Helane Rima DO   Signed by:   Helane Rima DO on 08/11/2010   Method used:   Print then Give to Patient   RxID:   [id number] VICODIN 5-500 MG TABS (HYDROCODONE-ACETAMINOPHEN) one by mouth three times a day as needed pain  #90 x  0   Entered and Authorized by:   Helane Rima DO   Signed by:   Helane Rima DO on 08/11/2010   Method used:   Print then Give to Patient   RxID:   [id number]   Prevention & Chronic Care Immunizations   Influenza vaccine: Not documented   Influenza vaccine deferral: Deferred  (08/11/2010)    Tetanus booster: 07/12/2010: Tdap    Pneumococcal vaccine: Not documented  Colorectal Screening   Hemoccult: Not documented   Hemoccult action/deferral: Not indicated  (02/23/2010)    Colonoscopy: Not documented   Colonoscopy action/deferral: Refused  (03/30/2010)  Other Screening   PSA: Not documented   PSA action/deferral: Discussion deferred  (02/23/2010)   Smoking status: current  (08/11/2010)   Smoking cessation counseling: yes  (08/10/2009)  Lipids   Total Cholesterol:  188  (07/12/2010)   Lipid panel action/deferral: Lipid Panel ordered   LDL: 103  (07/12/2010)   LDL Direct: Not documented   HDL: 34  (07/12/2010)   Triglycerides: 254  (07/12/2010)    SGOT (AST): 17  (02/23/2010)   SGPT (ALT): 19  (02/23/2010)   Alkaline phosphatase: 85  (02/23/2010)   Total bilirubin: 0.4  (02/23/2010)    Lipid flowsheet reviewed?: Yes   Progress toward LDL goal: Improved  Hypertension   Last Blood Pressure: 117 / 69  (08/11/2010)   Serum creatinine: 1.06  (02/23/2010)   Serum potassium 4.1  (02/23/2010)    Hypertension flowsheet reviewed?: Yes   Progress toward BP goal: At goal  Self-Management Support :   Personal Goals (by the next clinic visit) :      Personal blood pressure goal: 140/90  (07/14/2009)     Personal LDL goal: 130  (07/14/2009)    Patient will work on the following items until the next clinic visit to reach self-care goals:     Medications and monitoring: take my medicines every day, bring all of my medications to every visit  (08/11/2010)     Eating: drink diet soda or water instead of juice or soda, eat more vegetables, use fresh or frozen  vegetables, eat foods that are low in salt, eat baked foods instead of fried foods, eat fruit for snacks and desserts, limit or avoid alcohol  (08/11/2010)     Activity: take a 30 minute walk every day, take the stairs instead of the elevator, park at the far end of the parking lot  (08/11/2010)    Hypertension self-management support: Written self-care plan  (08/11/2010)   Hypertension self-care plan printed.    Lipid self-management support: Written self-care plan  (08/11/2010)   Lipid self-care plan printed.

## 2010-12-12 NOTE — Assessment & Plan Note (Signed)
Summary: f/u bp [facility]   Vital Signs:  Patient profile:   55 year old male Height:      72.5 inches Weight:      222.31 pounds BMI:     29.84 Temp:     97.5 degrees F oral Pulse rate:   82 / minute Pulse rhythm:   regular BP sitting:   127 / 81  (right arm)  Vitals Entered By: Modesta Messing LPN (July 14, 2009 2:39 PM) CC: BP check. Back pain. Is Patient Diabetic? No Pain Assessment Patient in pain? yes     Location: back Intensity: 7 Type: aching Onset of pain  Chronic   Primary Care Provider:  Helane Rima DO  CC:  BP check. Back pain.Marland Kitchen  History of Present Illness: 55 year old WM  1. Back Pain: x several years, has been told before that it is due to OA, was on Vioxx for a while. he states that he has tried ibuprofen, naproxyn, ultram, flexeril, SOMA, and neurontin but they all made his "legs twitch." he states that PT "didn't help." he requests Rx vicodin or percocet. endorses pain down bilateral legs. denies bowel or bladder incontinence, recent trauma, prolonged corticosteroid use, weakness/numbness/tingling in LE. States that Mobic helps a little, but requests increasing daily Vicodin to 3 by mouth daily (1 in the am, 1 after school, and 1 before bed) to control pain. He has not started the Nortriptyline because he read that it is a depression med and he is not depressed.  2. HTN: Normal today. Took lisinopril x 3 days (last pill yesterday) and felt "not like myself." Upon further questioning, he endorses fatigue. He denies CP, SOB, N/V/D, LE edema, HA, dizziness, vision changes.  3. Skin Rash: bilateral legs from ankle to thigh, with some lesions on back, x 2 years, itchy, flakes, this has never been evaluated before, he has tried nothing to make better. + KOH at last visit. LFTs WNL.  Habits & Providers  Alcohol-Tobacco-Diet     Tobacco Status: current     Tobacco Counseling: to quit use of tobacco products     Cigarette Packs/Day: 1.0  Comments: Pt does not  want to quit smoking.  Current Medications (verified): 1)  Mobic 7.5 Mg Tabs (Meloxicam) .... One By Mouth Daily 2)  Nortriptyline Hcl 50 Mg Caps (Nortriptyline Hcl) .... One By Mouth Q Hs 3)  Hydrocodone-Acetaminophen 7.5-500 Mg Tabs (Hydrocodone-Acetaminophen) .... One By Mouth Two Times A Day As Needed For Pain 4)  Lisinopril 20 Mg Tabs (Lisinopril) .... 1/2 Tablet By Mouth Daily 5)  Terbinafine Hcl 250 Mg Tabs (Terbinafine Hcl) .... One By Mouth Daily X 14 Days 6)  Lovastatin 20 Mg Tabs (Lovastatin) .... One By Mouth Daily  Allergies (verified): No Known Drug Allergies  Past History:  Social History: Last updated: 06/20/2009 Lives with wife and daughter born in 35.   Pt is currently unemployeed.  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 Smokes 1 ppd x 35 years, and drinks approximately 3 beers per day.  Uses no illicit drugs.  Walks or swims for exercise. He is primary caregiver for wife (has ovarian Ca, Dx in 1995, chemo at [facility]).  Review of Systems       per HPI, otherwise negative  Physical Exam  General:  Well-developed,well-nourished, in no acute distress; alert, appropriate and cooperative throughout examination. Vital signs reviewed. Lungs:  CTAB no wheeze. Heart:  RRR. Msk:  Back: Decreased flexion  due to pain, negative SLR, slight ttp along spinus processes of lumbar spine, hypertonic lumbar paraspinal mm, good strength LE, normal reflexes.   Impression & Recommendations:  Problem # 1:  SKIN RASH (ICD-782.1) Assessment Unchanged + KOH, normal CMP. Rx: Terbinafine Hcl 250 Mg Tabs (Terbinafine hcl) .... One by mouth daily x 14 days. Educated patient re: side effects of this medication and s/s that would prompt return/stopping med. Follow up in 2-3 weeks.  Orders: [facility]- Est  Level 4 (91478)  Problem # 2:  BACK PAIN (ICD-724.5) Assessment: Unchanged No red flags. Will send to [facility] evaluation. Exam not consistent with complaint and  history. Would like second opinion re: need for chronic narcotics in this patient. He refuses many medications (see previous note) due to "leg twitching" and refuses PT since it "hasn't worked in the past." We discussed the reason for taking the Nortriptyline and he agrees to start the medication.   His updated medication list for this problem includes:    Mobic 7.5 Mg Tabs (Meloxicam) ..... One by mouth daily    Hydrocodone-acetaminophen 7.5-500 Mg Tabs (Hydrocodone-acetaminophen) ..... One by mouth two times a day as needed for pain  Orders: [facility]- Est  Level 4 (29562) Sports Medicine (Sports Med)  Problem # 3:  ESSENTIAL HYPERTENSION, BENIGN (ICD-401.1) Assessment: Improved Stopped yesterday after taking for 3 days. Today, BP controlled. ? lasting effect. Advised patient to take 1/2 tab to see if he stills feels tired and to check [facility] at [facility] until I see him again.   His updated medication list for this problem includes:    Lisinopril 20 Mg Tabs (Lisinopril) .Marland Kitchen... 1/2 tablet by mouth daily  Orders: [facility]- Est  Level 4 (13086)  Problem # 4:  HYPERLIPIDEMIA (ICD-272.4) Assessment: New Lovastatin since on [facility] $4 list. Discussed diet changes. Reduce ETOH. Start medication AFTER finishing antifugal.  His updated medication list for this problem includes:    Lovastatin 20 Mg Tabs (Lovastatin) ..... One by mouth daily  Orders: [facility]- Est  Level 4 (99214)  Problem # 5:  OBESITY (ICD-278.00) Rec healthy diet and exercise for weight loss.  Problem # 6:  TOBACCO ABUSE (ICD-305.1) Not ready to quit. Orders: [facility]- Est  Level 4 (99214)  Complete Medication List: 1)  Mobic 7.5 Mg Tabs (Meloxicam) .... One by mouth daily 2)  Nortriptyline Hcl 50 Mg Caps (Nortriptyline hcl) .... One by mouth q hs 3)  Hydrocodone-acetaminophen 7.5-500 Mg Tabs (Hydrocodone-acetaminophen) .... One by mouth two times a day as needed for pain 4)  Lisinopril 20 Mg Tabs (Lisinopril) .... 1/2 tablet by mouth  daily 5)  Terbinafine Hcl 250 Mg Tabs (Terbinafine hcl) .... One by mouth daily x 14 days 6)  Lovastatin 20 Mg Tabs (Lovastatin) .... One by mouth daily  Patient Instructions: 1)  I am going to send you to our sports medicine clinic to evaluate your back pain. 2)  You skin rash looks like it is caused by a fungus. You will need to take a medication for this for 14 days. 3)  Please check your blood pressure at home and record those numbers so that we can go over them next time. 4)  Follow up in 1 month. Prescriptions: LOVASTATIN 20 MG TABS (LOVASTATIN) one by mouth daily  #30 x 3   Entered and Authorized by:   Helane Rima MD   Signed by:   Helane Rima MD on 07/14/2009   Method used:   Print then Give to Patient  RxID:   [id number] LOVASTATIN 20 MG TABS (LOVASTATIN)   #30 x 3   Entered and Authorized by:   Helane Rima MD   Signed by:   Helane Rima MD on 07/14/2009   Method used:   Print then Give to Patient   RxID:   [id number] HYDROCODONE-ACETAMINOPHEN 7.5-500 MG TABS (HYDROCODONE-ACETAMINOPHEN) one by mouth two times a day as needed for pain  #60 x 0   Entered and Authorized by:   Helane Rima MD   Signed by:   Helane Rima MD on 07/14/2009   Method used:   Print then Give to Patient   RxID:   [id number] TERBINAFINE HCL 250 MG TABS (TERBINAFINE HCL) one by mouth daily x 14 days  #14 x 0   Entered and Authorized by:   Helane Rima MD   Signed by:   Helane Rima MD on 07/14/2009   Method used:   Print then Give to Patient   RxID:   [id number] HYDROCODONE-ACETAMINOPHEN 7.5-500 MG TABS (HYDROCODONE-ACETAMINOPHEN) one by mouth two times a day as needed for pain  #60 x 0   Entered and Authorized by:   Helane Rima MD   Signed by:   Helane Rima MD on 07/14/2009   Method used:   Historical   RxID:   [id number] LISINOPRIL 20 MG TABS (LISINOPRIL) 1/2 tablet by mouth daily  #30 x 3   Entered and Authorized by:   Helane Rima MD   Signed by:    Helane Rima MD on 07/14/2009   Method used:   Historical   RxID:   [id number]   Prevention & Chronic Care Immunizations   Influenza vaccine: Not documented    Tetanus booster: Not documented    Pneumococcal vaccine: Not documented  Colorectal Screening   Hemoccult: Not documented    Colonoscopy: Not documented  Other Screening   PSA: Not documented   Smoking status: current  (07/14/2009)  Lipids   Total Cholesterol: 234  (07/01/2009)   LDL: See Comment mg/dL  (09/32/3557)   LDL Direct: Not documented   HDL: 36  (07/01/2009)   Triglycerides: 493  (07/01/2009)    SGOT (AST): 22  (07/01/2009)   SGPT (ALT): 28  (07/01/2009)   Alkaline phosphatase: 92  (07/01/2009)   Total bilirubin: 0.5  (07/01/2009)    Lipid flowsheet reviewed?: Yes   Progress toward LDL goal: Deteriorated  Hypertension   Last Blood Pressure: 127 / 81  (07/14/2009)   Serum creatinine: 1.12  (07/01/2009)   Serum potassium 4.8  (07/01/2009)    Hypertension flowsheet reviewed?: Yes   Progress toward BP goal: At goal  Self-Management Support :   Personal Goals (by the next clinic visit) :      Personal blood pressure goal: 140/90  (07/14/2009)     Personal LDL goal: 130  (07/14/2009)    Patient will work on the following items until the next clinic visit to reach self-care goals:     Medications and monitoring: take my medicines every day, check my blood pressure, bring all of my medications to every visit  (07/14/2009)     Eating: drink diet soda or water instead of juice or soda, eat more vegetables, use fresh or frozen vegetables, eat foods that are low in salt, eat baked foods instead of fried foods, eat fruit for snacks and desserts, limit or avoid alcohol  (07/14/2009)    Hypertension self-management support: Written self-care plan  (07/14/2009)   Hypertension self-care plan printed.    Lipid  self-management support: Written self-care plan  (07/14/2009)   Lipid self-care plan  printed.

## 2010-12-12 NOTE — Assessment & Plan Note (Signed)
Summary: f/u eo   Vital Signs:  Patient profile:   55 year old male Height:      72 inches Weight:      220 pounds BMI:     29.95 Temp:     97.9 degrees F oral Pulse rate:   67 / minute BP sitting:   123 / 68  (left arm) Cuff size:   regular  Vitals Entered By: Tessie Fass CMA (September 06, 2010 2:54 PM) CC: F/U back pain, HLD, HTN, GERD   Primary Care Provider:  Helane Rima DO  CC:  F/U back pain, HLD, HTN, and GERD.  History of Present Illness: 55 year old male:  1. Back Pain: Pain controlled on current regimen. He is now exercising more. History: He states that he has tried ibuprofen, naproxyn, ultram, flexeril, SOMA, and neurontin but they all made his "legs twitch." he states that PT "didn't help." endorses pain down bilateral legs. denies bowel or bladder incontinence, recent trauma, prolonged corticosteroid use.  states that Mobic helps some, but Vicodin is the only thing that really controls the pain. Xray lumbar spine: no evidence of acute bony abnormality or significant degenerative change.  there are small osteophytes extending from the vertebral bodies of the lumbar spine. patient was sent to Pain Clinic for evaluation and treatment. review of Pain Clinic notes: he had an EMG showing neuropathy. he was Dx with Myofascial pain syndrome. 2. Lumbar spondylosis. 3. Polyneuropathy. Tx Mobic, Vicodin 5/325 three times a day.   2. HLD: Has still not started medication 2/2 finances. Reviewed previous FLP - much improved, but still high triglycerides. 12/2009 CMP WNL.  3. HTN: Rx: Lisinopril. Denies CP, SOB, N/V/D, LE edema, HA, dizziness, vision changes.  4. GERD: Heartburn x 3 days last week, epigastric pain, belching, sour taste in mouth, improved with Tums. Wants medication for in case it starts again. No blood in stool.  Current Medications (verified): 1)  Mobic 15 Mg Tabs (Meloxicam) .... One By Mouth Daily 2)  Vicodin 5-500 Mg Tabs (Hydrocodone-Acetaminophen) .... One  By Mouth Three Times A Day As Needed Pain 3)  Lisinopril 20 Mg Tabs (Lisinopril) .Marland Kitchen.. 1 Tablet By Mouth Daily 4)  Trazodone Hcl 100 Mg Tabs (Trazodone Hcl) .... One By Mouth Q Hs 5)  Zyrtec Allergy 10 Mg  Tabs (Cetirizine Hcl) .Marland Kitchen.. 1 Once Daily Prn 6)  Simvastatin 20 Mg Tabs (Simvastatin) .Marland Kitchen.. 1 By Mouth At Bedtime  Allergies (verified): No Known Drug Allergies PMH-FH-SH reviewed for relevance  Review of Systems      See HPI  Physical Exam  General:  Well-developed,well-nourished, in no acute distress; alert, appropriate and cooperative throughout examination. Vital signs reviewed. Lungs:  Decreased breath sounds bilaterally. Heart:  RRR no m/r/g. Abdomen:  Soft, non-tender, and normal bowel sounds.   Msk:  Back: FROM. negative SLR, slight ttp along spinus processes of lumbar spine, hypertonic lumbar paraspinal mm, good strength LE, normal reflexes. Decreased lumbar lordosis.  Pulses:  2+ DP. Extremities:  No edema.   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 Refill x 3 months - No refill until  ~ 1/26. His updated medication list for this problem includes:    Mobic 15 Mg Tabs (Meloxicam) ..... One by mouth daily    Vicodin 5-500 Mg Tabs (Hydrocodone-acetaminophen) ..... One by mouth three times a day as needed pain  Orders: [facility]- Est  Level 4 (69629)  Problem # 2:  HYPERLIPIDEMIA (ICD-272.4) Assessment: Unchanged  Encouraged starting medication. His updated medication list for this problem includes:    Simvastatin 20 Mg Tabs (Simvastatin) .Marland Kitchen... 1 by mouth at bedtime  Orders: [facility]- Est  Level 4 (99214)  Problem # 3:  ESSENTIAL HYPERTENSION, BENIGN (ICD-401.1) Assessment: Unchanged  His updated medication list for this problem includes:    Lisinopril 20 Mg Tabs (Lisinopril) .Marland Kitchen... 1 tablet by mouth daily  Orders: [facility]- Est  Level 4 (16109)  Problem # 4:  GERD  (ICD-530.81) Assessment: New  Discussed lifestyle modification and Rx Ranitidine ($4 list). His updated medication list for this problem includes:    Ranitidine Hcl 150 Mg Caps (Ranitidine hcl) ..... Bid  Orders: [facility]- Est  Level 4 (99214)  Complete Medication List: 1)  Mobic 15 Mg Tabs (Meloxicam) .... One by mouth daily 2)  Vicodin 5-500 Mg Tabs (Hydrocodone-acetaminophen) .... One by mouth three times a day as needed pain 3)  Lisinopril 20 Mg Tabs (Lisinopril) .Marland Kitchen.. 1 tablet by mouth daily 4)  Trazodone Hcl 100 Mg Tabs (Trazodone hcl) .... One by mouth q hs 5)  Zyrtec Allergy 10 Mg Tabs (Cetirizine hcl) .Marland Kitchen.. 1 once daily prn 6)  Simvastatin 20 Mg Tabs (Simvastatin) .Marland Kitchen.. 1 by mouth at bedtime 7)  Ranitidine Hcl 150 Mg Caps (Ranitidine hcl) .... Bid  Patient Instructions: 1)  It was nice to see you today. 2)  You are due for colonoscopy. Prescriptions: RANITIDINE HCL 150 MG CAPS (RANITIDINE HCL) bid  #60 x 0   Entered and Authorized by:   Helane Rima DO   Signed by:   Helane Rima DO on 09/06/2010   Method used:   Print then Give to Patient   RxID:   [id number] RANITIDINE HCL 150 MG CAPS (RANITIDINE HCL) bid  #60 x 0   Entered and Authorized by:   Helane Rima DO   Signed by:   Helane Rima DO on 09/06/2010   Method used:   Print then Give to Patient   RxID:   [id number] SIMVASTATIN 20 MG TABS (SIMVASTATIN) 1 by mouth at bedtime  #90 x 3   Entered and Authorized by:   Helane Rima DO   Signed by:   Helane Rima DO on 09/06/2010   Method used:   Print then Give to Patient   RxID:   [id number] ZYRTEC ALLERGY 10 MG  TABS (CETIRIZINE HCL) 1 once daily prn  #30 x 3   Entered and Authorized by:   Helane Rima DO   Signed by:   Helane Rima DO on 09/06/2010   Method used:   Print then Give to Patient   RxID:   [id number] TRAZODONE HCL 100 MG TABS (TRAZODONE HCL) one by mouth q hs  #30 x 3   Entered and Authorized by:   Helane Rima DO   Signed  by:   Helane Rima DO on 09/06/2010   Method used:   Print then Give to Patient   RxID:   [id number] LISINOPRIL 20 MG TABS (LISINOPRIL) 1 tablet by mouth daily  #90 x 3   Entered and Authorized by:   Helane Rima DO   Signed by:   Helane Rima DO on 09/06/2010   Method used:   Print then Give to Patient   RxID:   [id number] MOBIC 15 MG TABS (MELOXICAM) one by mouth daily  #30 x 3   Entered and Authorized by:   Helane Rima DO   Signed by:   Helane Rima DO on  09/06/2010   Method used:   Print then Give to Patient   RxID:   [id number] OMEPRAZOLE 20 MG CPDR (OMEPRAZOLE) one by mouth daily  #90 x 3   Entered and Authorized by:   Helane Rima DO   Signed by:   Helane Rima DO on 09/06/2010   Method used:   Print then Give to Patient   RxID:   [id number] VICODIN 5-500 MG TABS (HYDROCODONE-ACETAMINOPHEN) one by mouth three times a day as needed pain  #90 x 0   Entered and Authorized by:   Helane Rima DO   Signed by:   Helane Rima DO on 09/06/2010   Method used:   Print then Give to Patient   RxID:   [id number] VICODIN 5-500 MG TABS (HYDROCODONE-ACETAMINOPHEN) one by mouth three times a day as needed pain  #90 x 0   Entered and Authorized by:   Helane Rima DO   Signed by:   Helane Rima DO on 09/06/2010   Method used:   Print then Mail to Patient   RxID:   [id number] VICODIN 5-500 MG TABS (HYDROCODONE-ACETAMINOPHEN) one by mouth three times a day as needed pain  #90 x 0   Entered and Authorized by:   Helane Rima DO   Signed by:   Helane Rima DO on 09/06/2010   Method used:   Print then Give to Patient   RxID:   [id number]  Note: Difficulty with printer. 3 month Vicodin given. Helane Rima DO  September 06, 2010 9:25 PM   Orders Added: 1)  [facility]- Est  Level 4 [10932]    Prevention & Chronic Care Immunizations   Influenza vaccine: Not documented   Influenza vaccine deferral: Refused  (09/06/2010)    Tetanus booster:  07/12/2010: Tdap    Pneumococcal vaccine: Not documented  Colorectal Screening   Hemoccult: Not documented   Hemoccult action/deferral: Not indicated  (02/23/2010)    Colonoscopy: Not documented   Colonoscopy action/deferral: Refused  (03/30/2010)  Other Screening   PSA: Not documented   PSA action/deferral: Discussion deferred  (02/23/2010)   Smoking status: current  (08/11/2010)   Smoking cessation counseling: yes  (08/10/2009)  Lipids   Total Cholesterol: 188  (07/12/2010)   Lipid panel action/deferral: Lipid Panel ordered   LDL: 103  (07/12/2010)   LDL Direct: Not documented   HDL: 34  (07/12/2010)   Triglycerides: 254  (07/12/2010)    SGOT (AST): 17  (02/23/2010)   SGPT (ALT): 19  (02/23/2010)   Alkaline phosphatase: 85  (02/23/2010)   Total bilirubin: 0.4  (02/23/2010)    Lipid flowsheet reviewed?: Yes   Progress toward LDL goal: Unchanged  Hypertension   Last Blood Pressure: 123 / 68  (09/06/2010)   Serum creatinine: 1.06  (02/23/2010)   Serum potassium 4.1  (02/23/2010)    Hypertension flowsheet reviewed?: Yes   Progress toward BP goal: At goal  Self-Management Support :   Personal Goals (by the next clinic visit) :      Personal blood pressure goal: 140/90  (07/14/2009)     Personal LDL goal: 130  (07/14/2009)    Patient will work on the following items until the next clinic visit to reach self-care goals:     Medications and monitoring: take my medicines every day, bring all of my medications to every visit  (09/06/2010)     Eating: drink diet soda or water instead of juice or soda, eat more vegetables, use fresh or frozen vegetables, eat foods that are  low in salt, eat baked foods instead of fried foods, eat fruit for snacks and desserts, limit or avoid alcohol  (09/06/2010)     Activity: take a 30 minute walk every day, take the stairs instead of the elevator, park at the far end of the parking lot  (09/06/2010)    Hypertension self-management  support: Written self-care plan  (09/06/2010)   Hypertension self-care plan printed.    Lipid self-management support: Written self-care plan  (09/06/2010)   Lipid self-care plan printed.

## 2010-12-12 NOTE — Assessment & Plan Note (Signed)
Summary: f/u,df   Vital Signs:  Patient profile:   55 year old male Height:      72.5 inches Weight:      232 pounds BMI:     31.14 Pulse rate:   81 / minute BP sitting:   165 / 100  (left arm) Cuff size:   large  Vitals Entered By: Arlyss Repress CMA, (July 01, 2009 8:30 AM) CC: f/lup HTN and lower back pain. Is Patient Diabetic? No Pain Assessment Patient in pain? yes     Location: lower back Intensity: 7 Onset of pain  Chronic   Primary Care Provider:  Helane Rima DO  CC:  f/lup HTN and lower back pain.Marland Kitchen  History of Present Illness: Todd Hayes is a 55 year old male presenting for follow up of HTN, low back pain, and skin rash.  1. Back Pain: x several years, has been told before that it is due to OA, was on Vioxx for a while. he states that he has tried ibuprofen, naproxyn, ultraml, flexeril, SOMA, and neurontin but they all made his "legs twitch." he states that PT "didn't help." he requests Rx vicodin or percocet. endorses pain down bilateral legs. denies bowel or bladder incontinence, recent trauma, prolonged corticosteroid use, weakness/numbness/tingling in LE.   2. HTN: Elevated BP at last visit, worse today. He has not started any medication yet. He denies CP, SOB, N/V/D, LE edema, HA, dizziness, vision changes.  3. Skin Rash: bilateral legs from ankle to thigh, with some lesions on back, x 2 years, itchy, flakes, this has never been evaluated before, he has tried nothing to make better.   Habits & Providers  Alcohol-Tobacco-Diet     Tobacco Status: current     Tobacco Counseling: to quit use of tobacco products     Cigarette Packs/Day: 1.0  Current Medications (verified): 1)  Mobic 7.5 Mg Tabs (Meloxicam) .... One By Mouth Daily 2)  Nortriptyline Hcl 50 Mg Caps (Nortriptyline Hcl) .... One By Mouth Q Hs 3)  Vicodin 5-500 Mg Tabs (Hydrocodone-Acetaminophen) .... One By Mouth At [facility] 4)  Lisinopril 20 Mg Tabs (Lisinopril) .Marland Kitchen.. 1 Tablet By  Mouth Daily  Allergies (verified): No Known Drug Allergies  Social History: Smoking Status:  current Packs/Day:  1.0  Review of Systems       per HPI, otherwise negative  Physical Exam  General:  Well-developed, well-nourished, in no acute distress; alert, appropriate and cooperative throughout examination. Vital signs reviewed. Msk:  Back: Decreased flexion and extension due to pain, negative SLR, ttp along spinus processes lumbar spine, hypertonic lumbar paraspinal mm, good strength LE, normal reflexes. Skin:  Bilateral LE with multiple flesh-colored, papular with crusted surface, pruritic. some lesions with verruca apearance. skin scraping KOH +. identical lesions identified on back. otherwise, skin dry with multiple excorations.   Impression & Recommendations:  Problem # 1:  SKIN RASH (ICD-782.1) Assessment New Leg skin scraping KOH +. Will check LFTs, RPR, HIV. If LFTs normal, will Rx oral medication.  Orders: KOH-[facility] (87564) RPR-[facility] [phone]) HIV-[facility] (66063-01601) [facility]- Est  Level 4 (09323)  Problem # 2:  ESSENTIAL HYPERTENSION, BENIGN (ICD-401.1) Assessment: New Start Lisinopril. Follow up in 2 weeks for BP check.  His updated medication list for this problem includes:    Lisinopril 20 Mg Tabs (Lisinopril) .Marland Kitchen... 1 tablet by mouth daily  Orders: [facility]- Est  Level 4 (55732)  Problem # 3:  BACK PAIN (ICD-724.5) Assessment: Unchanged Unchanged. Will send to orthopedics for evaluation  and recs of alternate forms of treatment since medication options are so limited and the patient refuses PT.  His updated medication list for this problem includes:    Mobic 7.5 Mg Tabs (Meloxicam) ..... One by mouth daily    Vicodin 5-500 Mg Tabs (Hydrocodone-acetaminophen) ..... One by mouth at hs for severe pain  Orders: [facility]- Est  Level 4 (16109)  Problem # 4:  TOBACCO ABUSE (ICD-305.1) Assessment: Unchanged Advised to quit. Not willing to stop at this time.  Orders: [facility]-  Est  Level 4 (99214)  Problem # 5:  OBESITY (ICD-278.00) Assessment: Unchanged Rec healthy diet and exercise for weight loss.  Complete Medication List: 1)  Mobic 7.5 Mg Tabs (Meloxicam) .... One by mouth daily 2)  Nortriptyline Hcl 50 Mg Caps (Nortriptyline hcl) .... One by mouth q hs 3)  Vicodin 5-500 Mg Tabs (Hydrocodone-acetaminophen) .... One by mouth at hs for severe pain 4)  Lisinopril 20 Mg Tabs (Lisinopril) .Marland Kitchen.. 1 tablet by mouth daily  Patient Instructions: 1)  It was great to see you today. 2)  I am going to send you to an orthopedic doctor to evaluate and treat your back pain. 3)  You skin rash looks like it is caused by a fungus. You will need to take a medication for this, but first I would like to check your liver function. 4)  Please follow up in 2 weeks so that we can check your blood pressure. Prescriptions: VICODIN 5-500 MG TABS (HYDROCODONE-ACETAMINOPHEN) one by mouth at hs for severe pain  #20 x 0   Entered and Authorized by:   Helane Rima MD   Signed by:   Helane Rima MD on 07/01/2009   Method used:   Print then Give to Patient   RxID:   [id number] LISINOPRIL 20 MG TABS (LISINOPRIL) 1 tablet by mouth daily  #90 x 3   Entered and Authorized by:   Helane Rima MD   Signed by:   Helane Rima MD on 07/01/2009   Method used:   Print then Give to Patient   RxID:   [id number] VICODIN 5-500 MG TABS (HYDROCODONE-ACETAMINOPHEN) one by mouth at hs for severe pain  #20 x 0   Entered and Authorized by:   Helane Rima MD   Signed by:   Helane Rima MD on 07/01/2009   Method used:   Print then Give to Patient   RxID:   [id number] NYSTATIN 100000 UNIT/GM POWD (NYSTATIN) apply to affected area BID  #1 x 3   Entered and Authorized by:   Helane Rima MD   Signed by:   Helane Rima MD on 07/01/2009   Method used:   Print then Give to Patient   RxID:   [id number] LISINOPRIL 20 MG TABS (LISINOPRIL) 1 tablet by mouth daily  #90 x 3   Entered and  Authorized by:   Helane Rima MD   Signed by:   Helane Rima MD on 07/01/2009   Method used:   Print then Give to Patient   RxID:   [id number]   Laboratory Results  Date/Time Received: July 01, 2009 9:07 AM  Date/Time Reported: July 01, 2009 9:16 AM   Other Tests  Skin KOH: Positive Comments: ...........test performed by...........Marland KitchenTerese Door, CMA

## 2010-12-12 NOTE — Assessment & Plan Note (Signed)
Summary: f/u eo   Vital Signs:  Patient profile:   55 year old male Height:      72 inches Weight:      215 pounds BMI:     29.26 Temp:     97.3 degrees F oral Pulse rate:   73 / minute BP sitting:   156 / 91  (left arm) Cuff size:   regular  Vitals Entered By: Tessie Fass CMA (Mar 30, 2010 2:37 PM) CC: F/U Is Patient Diabetic? No Pain Assessment Patient in pain? yes     Location: lower back Intensity: 6   Primary Care Provider:  Helane Rima DO  CC:  F/U.  History of Present Illness: 55 year old male:  1. Grief: Patient's wife actively now dying from endometrial cancer. She has home hospice. He is connected with bereavement counseling. He in no longer participating in classes 2/2 needing to be with his wife. No SI/HI.  2. Back Pain: History: He states that he has tried ibuprofen, naproxyn, ultram, flexeril, SOMA, and neurontin but they all made his "legs twitch." he states that PT "didn't help." endorses pain down bilateral legs. denies bowel or bladder incontinence, recent trauma, prolonged corticosteroid use.  states that Mobic helps some, but Vicodin is the only thing that really controls the pain. Xray lumbar spine: no evidence of acute bony abnormality or significant degenerative change.  there are small osteophytes extending from the vertebral bodies of the lumbar spine. patient was sent to Pain Clinic for evaluation and treatment.  review of Pain Clinic notes: he had an EMG showing neuropathy. he was Dx with Myofascial pain syndrome. 2. Lumbar spondylosis. 3. Polyneuropathy.  Tx Mobic, Vicodin 5/325 two times a day.   3. HTN: Rx: Lisinopril. Denies CP, SOB, N/V/D, LE edema, HA, dizziness, vision changes.  Habits & Providers  Alcohol-Tobacco-Diet     Tobacco Status: current     Tobacco Counseling: to quit use of tobacco products     Cigarette Packs/Day: 1.0  Current Medications (verified): 1)  Mobic 15 Mg Tabs (Meloxicam) 2)  Vicodin 5-500 Mg Tabs  (Hydrocodone-Acetaminophen) .... One By Mouth Three Times A Day As Needed Pain 3)  Lisinopril 20 Mg Tabs (Lisinopril) .Marland Kitchen.. 1 Tablet By Mouth Daily 4)  Simvastatin 20 Mg Tabs (Simvastatin) .Marland Kitchen.. 1 By Mouth At Bedtime 5)  Ventolin Hfa 108 (90 Base) Mcg/act Aers (Albuterol Sulfate) .... 2 Puffs Q 4-6 Hours As Needed Wheeze 6)  Trazodone Hcl 50 Mg Tabs (Trazodone Hcl) .... 1/2 By Mouth At Bedtime As Needed Insomnia  Allergies (verified): No Known Drug Allergies PMH-FH-SH reviewed-no changes except otherwise noted  Social History: Packs/Day:  1.0  Review of Systems       Denies CP, SOB, N/V/D, LE edema, HA, dizziness, vision changes, SI/HI.  Physical Exam  General:  Well-developed,well-nourished, in no acute distress; alert, appropriate and cooperative throughout examination. Vital signs reviewed. Lungs:  Decreased breath sounds bilaterally. Heart:  RRR no m/r/g. Msk:  Back: FROM. negative SLR, slight ttp along spinus processes of lumbar spine, hypertonic lumbar paraspinal mm, good strength LE, normal reflexes. Decreased lumbar lordosis.  Pulses:  2+ DP. Extremities:  No edema. Psych:  Oriented X3, memory intact for recent and remote, normally interactive, good eye contact, not anxious appearing, and tearful.     Impression & Recommendations:  Problem # 1:  GRIEF REACTION (ICD-309.0) Assessment Unchanged Patient has support through [facility] and family. No SI/HI. He becomes tearful when discussing her, but does seem comfortable discussing  his feelings. Will follow closely. I asked him to let me know if she passes before our next visit. 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 Will continue with Pain Control via Mobic and Vicodin for now. Must use one pharmacy. No early refills. His updated  medication list for this problem includes:    Mobic 15 Mg Tabs (Meloxicam)    Vicodin 5-500 Mg Tabs (Hydrocodone-acetaminophen) ..... One by mouth three times a day as needed pain  Orders: [facility]- Est Level  3 (99213)  Problem # 4:  ESSENTIAL HYPERTENSION, BENIGN (ICD-401.1) Assessment: Unchanged Increased to full pill daily. His updated medication list for this problem includes:    Lisinopril 20 Mg Tabs (Lisinopril) .Marland Kitchen... 1 tablet by mouth daily  Problem # 5:  HYPERLIPIDEMIA (ICD-272.4) Patient states that he has trouble affording this medication. Explained the importance of lipid control. Directed to cheaper pharmacies for this med. His updated medication list for this problem includes:    Simvastatin 20 Mg Tabs (Simvastatin) .Marland Kitchen... 1 by mouth at bedtime  Problem # 6:  TOBACCO ABUSE (ICD-305.1) Assessment: Unchanged Not interested in quitting.  Complete Medication List: 1)  Mobic 15 Mg Tabs (Meloxicam) 2)  Vicodin 5-500 Mg Tabs (Hydrocodone-acetaminophen) .... One by mouth three times a day as needed pain 3)  Lisinopril 20 Mg Tabs (Lisinopril) .Marland Kitchen.. 1 tablet by mouth daily 4)  Simvastatin 20 Mg Tabs (Simvastatin) .Marland Kitchen.. 1 by mouth at bedtime 5)  Ventolin Hfa 108 (90 Base) Mcg/act Aers (Albuterol sulfate) .... 2 puffs q 4-6 hours as needed wheeze 6)  Trazodone Hcl 50 Mg Tabs (Trazodone hcl) .... 1/2 by mouth at bedtime as needed insomnia  Patient Instructions: 1)  It was nice to see you again. 2)  I am sorry to hear about your wife. Please keep me updated. Prescriptions: TRAZODONE HCL 50 MG TABS (TRAZODONE HCL) 1/2 by mouth at bedtime as needed insomnia  #30 x 0   Entered and Authorized by:   Helane Rima DO   Signed by:   Helane Rima DO on 03/30/2010   Method used:   Print then Give to Patient   RxID:   [id number] LISINOPRIL 20 MG TABS (LISINOPRIL) 1 tablet by mouth daily  #90 x 3   Entered and Authorized by:   Helane Rima DO   Signed by:   Helane Rima DO on  03/30/2010   Method used:   Print then Give to Patient   RxID:   [id number] VICODIN 5-500 MG TABS (HYDROCODONE-ACETAMINOPHEN) one by mouth three times a day as needed pain  #90 x 0   Entered and Authorized by:   Helane Rima DO   Signed by:   Helane Rima DO on 03/30/2010   Method used:   Print then Give to Patient   RxID:   [id number] MOBIC 15 MG TABS (MELOXICAM)   #90 x 0   Entered and Authorized by:   Helane Rima DO   Signed by:   Helane Rima DO on 03/30/2010   Method used:   Print then Give to Patient   RxID:   [id number]   Prevention & Chronic Care Immunizations   Influenza vaccine: Not documented   Influenza vaccine deferral: Not indicated  (02/23/2010)    Tetanus booster: Not documented    Pneumococcal vaccine: Not documented  Colorectal  Screening   Hemoccult: Not documented   Hemoccult action/deferral: Not indicated  (02/23/2010)    Colonoscopy: Not documented   Colonoscopy action/deferral: Refused  (03/30/2010)  Other Screening   PSA: Not documented   PSA action/deferral: Discussion deferred  (02/23/2010)   Smoking status: current  (03/30/2010)   Smoking cessation counseling: yes  (08/10/2009)  Lipids   Total Cholesterol: 234  (07/01/2009)   LDL: See Comment mg/dL  (98/09/9146)   LDL Direct: Not documented   HDL: 36  (07/01/2009)   Triglycerides: 493  (07/01/2009)    SGOT (AST): 17  (02/23/2010)   SGPT (ALT): 19  (02/23/2010)   Alkaline phosphatase: 85  (02/23/2010)   Total bilirubin: 0.4  (02/23/2010)    Lipid flowsheet reviewed?: Yes   Progress toward LDL goal: Unchanged  Hypertension   Last Blood Pressure: 156 / 91  (03/30/2010)   Serum creatinine: 1.06  (02/23/2010)   Serum potassium 4.1  (02/23/2010)    Hypertension flowsheet reviewed?: Yes   Progress toward BP goal: Unchanged  Self-Management Support :   Personal Goals (by the next clinic visit) :      Personal blood pressure goal: 140/90  (07/14/2009)      Personal LDL goal: 130  (07/14/2009)    Patient will work on the following items until the next clinic visit to reach self-care goals:     Medications and monitoring: take my medicines every day, bring all of my medications to every visit  (03/30/2010)     Eating: drink diet soda or water instead of juice or soda, eat more vegetables, use fresh or frozen vegetables, eat foods that are low in salt, eat baked foods instead of fried foods, eat fruit for snacks and desserts, limit or avoid alcohol  (03/30/2010)     Activity: take a 30 minute walk every day, take the stairs instead of the elevator, park at the far end of the parking lot  (03/30/2010)    Hypertension self-management support: Written self-care plan  (03/30/2010)   Hypertension self-care plan printed.    Lipid self-management support: Written self-care plan  (03/30/2010)   Lipid self-care plan printed.

## 2010-12-12 NOTE — Assessment & Plan Note (Signed)
Summary: f/u,df   Vital Signs:  Patient profile:   55 year old male Height:      72 inches Weight:      216 pounds BMI:     29.40 Temp:     97.9 degrees F oral Pulse rate:   80 / minute BP sitting:   130 / 74  (left arm) Cuff size:   regular  Vitals Entered By: Tessie Fass CMA (June 14, 2010 2:06 PM) CC: F/U Is Patient Diabetic? No Pain Assessment Patient in pain? yes     Location: back, abdomen Intensity: 6   Primary Care Provider:  Helane Rima DO  CC:  F/U.  History of Present Illness: 55 year old male:  1. Back Pain: History: He states that he has tried ibuprofen, naproxyn, ultram, flexeril, SOMA, and neurontin but they all made his "legs twitch." he states that PT "didn't help." endorses pain down bilateral legs. denies bowel or bladder incontinence, recent trauma, prolonged corticosteroid use.  states that Mobic helps some, but Vicodin is the only thing that really controls the pain. Xray lumbar spine: no evidence of acute bony abnormality or significant degenerative change.  there are small osteophytes extending from the vertebral bodies of the lumbar spine. patient was sent to Pain Clinic for evaluation and treatment. review of Pain Clinic notes: he had an EMG showing neuropathy. he was Dx with Myofascial pain syndrome. 2. Lumbar spondylosis. 3. Polyneuropathy.  Tx Mobic, Vicodin 5/325 three times a day.   3. HTN: Rx: Lisinopril. Denies CP, SOB, N/V/D, LE edema, HA, dizziness, vision changes.  4. Insomnia: Rx Trazodone 50 mg by mouth q hs. Able to fall asleep easily but wakes several times throughout the night. States that he got used to waking to help take care of his wife before she died.  Habits & Providers  Alcohol-Tobacco-Diet     Tobacco Status: current     Tobacco Counseling: to quit use of tobacco products     Cigarette Packs/Day: 1.0  Current Medications (verified): 1)  Mobic 15 Mg Tabs (Meloxicam) 2)  Vicodin 5-500 Mg Tabs  (Hydrocodone-Acetaminophen) .... One By Mouth Three Times A Day As Needed Pain 3)  Lisinopril 20 Mg Tabs (Lisinopril) .Marland Kitchen.. 1 Tablet By Mouth Daily 4)  Simvastatin 20 Mg Tabs (Simvastatin) .Marland Kitchen.. 1 By Mouth At Bedtime 5)  Trazodone Hcl 100 Mg Tabs (Trazodone Hcl) .... One By Mouth Q Hs 6)  Melatonin 1 Mg Caps (Melatonin) .... One By Mouth Q Hs, May Increase To 3 By Mouth Q Hs If Neesed  Allergies (verified): No Known Drug Allergies PMH-FH-SH reviewed for relevance  Review of Systems General:  Denies chills and fever. CV:  Denies chest pain or discomfort, palpitations, shortness of breath with exertion, and swelling of feet. Resp:  Denies cough and wheezing. GI:  Denies change in bowel habits. MS:  Complains of joint pain, low back pain, and mid back pain; denies joint redness, joint swelling, and muscle weakness. Neuro:  Denies falling down, numbness, and tingling. Psych:  Denies suicidal thoughts/plans and thoughts /plans of harming others.  Physical Exam  General:  Well-developed,well-nourished, in no acute distress; alert, appropriate and cooperative throughout examination. Vital signs reviewed. Lungs:  Decreased breath sounds bilaterally. Heart:  RRR no m/r/g. Msk:  Back: FROM. negative SLR, slight ttp along spinus processes of lumbar spine, hypertonic lumbar paraspinal mm, good strength LE, normal reflexes. Decreased lumbar lordosis.  Neurologic:  Alert & oriented X3, cranial nerves II-XII intact, strength normal in  all extremities, sensation intact to light touch, gait normal, and DTRs symmetrical and normal.   Psych:  Oriented X3, memory intact for recent and remote, normally interactive, good eye contact, not anxious appearing. Affect improved.   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 His updated medication list for this problem includes:    Mobic 15 Mg Tabs (Meloxicam)    Vicodin 5-500 Mg  Tabs (Hydrocodone-acetaminophen) ..... One by mouth three times a day as needed pain  Orders: [facility]- Est  Level 4 (99214)  Problem # 2:  ESSENTIAL HYPERTENSION, BENIGN (ICD-401.1) Assessment: Unchanged  His updated medication list for this problem includes:    Lisinopril 20 Mg Tabs (Lisinopril) .Marland Kitchen... 1 tablet by mouth daily  Orders: [facility]- Est  Level 4 (99214)  Problem # 3:  INSOMNIA, CHRONIC (ICD-307.42) Assessment: Unchanged  Discussed good sleep hygeine. Rx Trazodone and Melatonin.  Orders: [facility]- Est  Level 4 (14782)  Problem # 4:  TOBACCO ABUSE (ICD-305.1) Assessment: Unchanged  Advised patient to quit. He is not interested in smoking cessation at this time.  Orders: [facility]- Est  Level 4 (99214)  Complete Medication List: 1)  Mobic 15 Mg Tabs (Meloxicam) 2)  Vicodin 5-500 Mg Tabs (Hydrocodone-acetaminophen) .... One by mouth three times a day as needed pain 3)  Lisinopril 20 Mg Tabs (Lisinopril) .Marland Kitchen.. 1 tablet by mouth daily 4)  Simvastatin 20 Mg Tabs (Simvastatin) .Marland Kitchen.. 1 by mouth at bedtime 5)  Trazodone Hcl 100 Mg Tabs (Trazodone hcl) .... One by mouth q hs 6)  Melatonin 1 Mg Caps (Melatonin) .... One by mouth q hs, may increase to 3 by mouth q hs if neesed  Patient Instructions: 1)  It was nice to see you again. 2)  I am increasing your Trazodone. Start Melatonin. 3)  You are due for colonoscopy. Prescriptions: VICODIN 5-500 MG TABS (HYDROCODONE-ACETAMINOPHEN) one by mouth three times a day as needed pain  #90 x 0   Entered and Authorized by:   Helane Rima DO   Signed by:   Helane Rima DO on 06/14/2010   Method used:   Print then Give to Patient   RxID:   [id number] MOBIC 15 MG TABS (MELOXICAM)   #90 x 0   Entered and Authorized by:   Helane Rima DO   Signed by:   Helane Rima DO on 06/14/2010   Method used:   Print then Give to Patient   RxID:   [id number] TRAZODONE HCL 100 MG TABS (TRAZODONE HCL) one by mouth q hs  #30 x 3   Entered and  Authorized by:   Helane Rima DO   Signed by:   Helane Rima DO on 06/14/2010   Method used:   Print then Give to Patient   RxID:   [id number]   Prevention & Chronic Care Immunizations   Influenza vaccine: Not documented   Influenza vaccine deferral: Not indicated  (02/23/2010)    Tetanus booster: Not documented    Pneumococcal vaccine: Not documented  Colorectal Screening   Hemoccult: Not documented   Hemoccult action/deferral: Not indicated  (02/23/2010)    Colonoscopy: Not documented   Colonoscopy action/deferral: Refused  (03/30/2010)  Other Screening   PSA: Not documented   PSA action/deferral: Discussion deferred  (02/23/2010)   Smoking status: current  (06/14/2010)   Smoking cessation counseling: yes  (08/10/2009)  Lipids   Total Cholesterol: 234  (07/01/2009)   LDL: See Comment mg/dL  (63/87/5643)  LDL Direct: Not documented   HDL: 36  (07/01/2009)   Triglycerides: 493  (07/01/2009)    SGOT (AST): 17  (02/23/2010)   SGPT (ALT): 19  (02/23/2010)   Alkaline phosphatase: 85  (02/23/2010)   Total bilirubin: 0.4  (02/23/2010)    Lipid flowsheet reviewed?: Yes   Progress toward LDL goal: Unchanged  Hypertension   Last Blood Pressure: 130 / 74  (06/14/2010)   Serum creatinine: 1.06  (02/23/2010)   Serum potassium 4.1  (02/23/2010)    Hypertension flowsheet reviewed?: Yes   Progress toward BP goal: At goal  Self-Management Support :   Personal Goals (by the next clinic visit) :      Personal blood pressure goal: 140/90  (07/14/2009)     Personal LDL goal: 130  (07/14/2009)    Patient will work on the following items until the next clinic visit to reach self-care goals:     Medications and monitoring: take my medicines every day, bring all of my medications to every visit  (06/14/2010)     Eating: drink diet soda or water instead of juice or soda, eat more vegetables, use fresh or frozen vegetables, eat foods that are low in salt, eat baked foods  instead of fried foods, eat fruit for snacks and desserts, limit or avoid alcohol  (06/14/2010)     Activity: take a 30 minute walk every day, take the stairs instead of the elevator, park at the far end of the parking lot  (06/14/2010)    Hypertension self-management support: Written self-care plan  (06/14/2010)   Hypertension self-care plan printed.    Lipid self-management support: Written self-care plan  (06/14/2010)   Lipid self-care plan printed.

## 2010-12-12 NOTE — Assessment & Plan Note (Signed)
Summary: f/u & labs,df   Vital Signs:  Patient profile:   55 year old male Weight:      213.2 pounds Temp:     97.6 degrees F Pulse rate:   66 / minute BP sitting:   147 / 90  Vitals Entered By: Starleen Blue RN (July 12, 2010 9:02 AM) CC: f/u Is Patient Diabetic? No Pain Assessment Patient in pain? no        Primary Care Provider:  Helane Rima DO  CC:  f/u.  History of Present Illness: 55 year old male:  1. Back Pain: History: He states that he has tried ibuprofen, naproxyn, ultram, flexeril, SOMA, and neurontin but they all made his "legs twitch." he states that PT "didn't help." endorses pain down bilateral legs. denies bowel or bladder incontinence, recent trauma, prolonged corticosteroid use.  states that Mobic helps some, but Vicodin is the only thing that really controls the pain. Xray lumbar spine: no evidence of acute bony abnormality or significant degenerative change.  there are small osteophytes extending from the vertebral bodies of the lumbar spine. patient was sent to Pain Clinic for evaluation and treatment. review of Pain Clinic notes: he had an EMG showing neuropathy. he was Dx with Myofascial pain syndrome. 2. Lumbar spondylosis. 3. Polyneuropathy. Tx Mobic, Vicodin 5/325 three times a day.   3. HTN: Rx: Lisinopril. Denies CP, SOB, N/V/D, LE edema, HA, dizziness, vision changes.  4. Insomnia: Rx Trazodone 100 mg by mouth q hs. Able to fall asleep easily but still wakes early in the morning. States that he got used to waking to help take care of his wife before she died. Tried Melatonin but it gave him "the jerks."  Habits & Providers  Alcohol-Tobacco-Diet     Tobacco Status: current     Cigarette Packs/Day: 1.0  Current Medications (verified): 1)  Mobic 15 Mg Tabs (Meloxicam) 2)  Vicodin 5-500 Mg Tabs (Hydrocodone-Acetaminophen) .... One By Mouth Three Times A Day As Needed Pain 3)  Lisinopril 20 Mg Tabs (Lisinopril) .Marland Kitchen.. 1 Tablet By Mouth Daily 4)   Simvastatin 20 Mg Tabs (Simvastatin) .Marland Kitchen.. 1 By Mouth At Bedtime 5)  Trazodone Hcl 100 Mg Tabs (Trazodone Hcl) .... One By Mouth Q Hs 6)  Melatonin 1 Mg Caps (Melatonin) .... One By Mouth Q Hs, May Increase To 3 By Mouth Q Hs If Neesed  Allergies (verified): No Known Drug Allergies PMH-FH-SH reviewed for relevance  Review of Systems      See HPI  Physical Exam  General:  Well-developed,well-nourished, in no acute distress; alert, appropriate and cooperative throughout examination. Vital signs reviewed. Head:  Normocephalic and atraumatic without obvious abnormalities.  Eyes:  No corneal or conjunctival inflammation noted. EOMI. Perrla.  Ears:  R ear normal and L ear normal.   Nose:  Nasal discharge, mucosal pallor.   Mouth:  PND. Lungs:  Decreased breath sounds bilaterally. Heart:  RRR no m/r/g. Msk:  Back: FROM. negative SLR, slight ttp along spinus processes of lumbar spine, hypertonic lumbar paraspinal mm, good strength LE, normal reflexes. Decreased lumbar lordosis.  Psych:  Oriented X3, memory intact for recent and remote, normally interactive, good eye contact, not anxious appearing.    Impression & Recommendations:  Problem # 1:  BACK PAIN (ICD-724.5) Assessment Unchanged  Continue current treatment. Patient endorses walking more now with the dogs. Encouraged continued exercise. His updated medication list for this problem includes:    Mobic 15 Mg Tabs (Meloxicam)    Vicodin 5-500 Mg  Tabs (Hydrocodone-acetaminophen) ..... One by mouth three times a day as needed pain  Orders: [facility]- Est  Level 4 (99214)  Problem # 2:  ESSENTIAL HYPERTENSION, BENIGN (ICD-401.1) Assessment: Deteriorated  Ran out of medication yesterday. Will pick up new Rx today. His updated medication list for this problem includes:    Lisinopril 20 Mg Tabs (Lisinopril) .Marland Kitchen... 1 tablet by mouth daily  Orders: [facility]- Est  Level 4 (99214)  Problem # 3:  INSOMNIA, CHRONIC (ICD-307.42) Assessment:  Unchanged Likely habit at this point. Encouraged exercise and no naps during the day. He is getting about 8 hours of sleep each night before he wakes early in am.  Problem # 4:  TOBACCO ABUSE (ICD-305.1) Assessment: Unchanged  Refuses to quit.  Orders: [facility]- Est  Level 4 (99214)  Complete Medication List: 1)  Mobic 15 Mg Tabs (Meloxicam) 2)  Vicodin 5-500 Mg Tabs (Hydrocodone-acetaminophen) .... One by mouth three times a day as needed pain 3)  Lisinopril 20 Mg Tabs (Lisinopril) .Marland Kitchen.. 1 tablet by mouth daily 4)  Trazodone Hcl 100 Mg Tabs (Trazodone hcl) .... One by mouth q hs 5)  Zyrtec Allergy 10 Mg Tabs (Cetirizine hcl) .Marland Kitchen.. 1 once daily prn  Other Orders: T-Lipid Profile ([id number])  Patient Instructions: 1)  It was nice to see you today. 2)  We are checking your cholesterol today. 3)  You are due for colonoscopy. Prescriptions: ZYRTEC ALLERGY 10 MG  TABS (CETIRIZINE HCL) 1 once daily prn  #30 x 3   Entered and Authorized by:   Helane Rima DO   Signed by:   Helane Rima DO on 07/12/2010   Method used:   Print then Give to Patient   RxID:   [id number]   Prevention & Chronic Care Immunizations   Influenza vaccine: Not documented   Influenza vaccine deferral: Not indicated  (02/23/2010)    Tetanus booster: Not documented    Pneumococcal vaccine: Not documented  Colorectal Screening   Hemoccult: Not documented   Hemoccult action/deferral: Not indicated  (02/23/2010)    Colonoscopy: Not documented   Colonoscopy action/deferral: Refused  (03/30/2010)  Other Screening   PSA: Not documented   PSA action/deferral: Discussion deferred  (02/23/2010)   Smoking status: current  (07/12/2010)   Smoking cessation counseling: yes  (08/10/2009)  Lipids   Total Cholesterol: 234  (07/01/2009)   Lipid panel action/deferral: Lipid Panel ordered   LDL: See Comment mg/dL  (30/86/5784)   LDL Direct: Not documented   HDL: 36  (07/01/2009)   Triglycerides: 493   (07/01/2009)    SGOT (AST): 17  (02/23/2010)   SGPT (ALT): 19  (02/23/2010)   Alkaline phosphatase: 85  (02/23/2010)   Total bilirubin: 0.4  (02/23/2010)    Lipid flowsheet reviewed?: Yes   Progress toward LDL goal: Unchanged  Hypertension   Last Blood Pressure: 147 / 90  (07/12/2010)   Serum creatinine: 1.06  (02/23/2010)   Serum potassium 4.1  (02/23/2010)    Hypertension flowsheet reviewed?: Yes   Progress toward BP goal: Unchanged  Self-Management Support :   Personal Goals (by the next clinic visit) :      Personal blood pressure goal: 140/90  (07/14/2009)     Personal LDL goal: 130  (07/14/2009)    Patient will work on the following items until the next clinic visit to reach self-care goals:     Medications and monitoring: take my medicines every day, bring all of my medications to every visit  (07/12/2010)  Eating: drink diet soda or water instead of juice or soda, eat more vegetables, use fresh or frozen vegetables, eat foods that are low in salt, eat baked foods instead of fried foods, eat fruit for snacks and desserts, limit or avoid alcohol  (07/12/2010)     Activity: take a 30 minute walk every day, take the stairs instead of the elevator, park at the far end of the parking lot  (07/12/2010)    Hypertension self-management support: Written self-care plan  (07/12/2010)   Hypertension self-care plan printed.    Lipid self-management support: Written self-care plan  (07/12/2010)   Lipid self-care plan printed.   Appended Document: f/u & labs,df     Allergies: No Known Drug Allergies   Complete Medication List: 1)  Mobic 15 Mg Tabs (Meloxicam) 2)  Vicodin 5-500 Mg Tabs (Hydrocodone-acetaminophen) .... One by mouth three times a day as needed pain 3)  Lisinopril 20 Mg Tabs (Lisinopril) .Marland Kitchen.. 1 tablet by mouth daily 4)  Trazodone Hcl 100 Mg Tabs (Trazodone hcl) .... One by mouth q hs 5)  Zyrtec Allergy 10 Mg Tabs (Cetirizine hcl) .Marland Kitchen.. 1 once daily  prn  Other Orders: Tdap => 49yrs IM (62376) Admin 1st Vaccine (28315)    Immunizations Administered:  Tetanus Vaccine:    Vaccine Type: Tdap    Site: left deltoid    Mfr: [employer]    Dose: 0.5 ml    Route: IM    Given by: Tessie Fass CMA    Exp. Date: 05/11/2012    Lot #: [id number]    VIS given: 09/30/07 version given July 12, 2010.

## 2010-12-12 NOTE — Assessment & Plan Note (Signed)
Summary: f/up,tcb   Vital Signs:  Patient profile:   55 year old male Height:      72 inches Weight:      226 pounds BMI:     30.76 Temp:     98.4 degrees F oral Pulse rate:   82 / minute BP sitting:   169 / 93  (left arm) Cuff size:   regular  Vitals Entered By: Tessie Fass CMA (February 23, 2010 1:39 PM) CC: F/U low back pain Is Patient Diabetic? No Pain Assessment Patient in pain? yes     Location: lower back Intensity: 6   Primary Care Provider:  Helane Rima DO  CC:  F/U low back pain.  History of Present Illness: 55 year old male:  1. Back Pain: he states that he has tried ibuprofen, naproxyn, ultram, flexeril, SOMA, and neurontin but they all made his "legs twitch." he states that PT "didn't help." endorses pain down bilateral legs. denies bowel or bladder incontinence, recent trauma, prolonged corticosteroid use.  states that Mobic helps some, but Vicodin is the only thing that really controls the pain. Xray lumbar spine: no evidence of acute bony abnormality or significant degenerative change.  there are small osteophytes extending from the vertebral bodies of the lumbar spine. patient was sent to Pain Clinic for evaluation and treatment. he feels like they were unkind and did not take his pain seriously. review of Pain Clinic notes: he had a recent EMG showing neuropathy. he was Dx with Myofascial pain syndrome. 2. Lumbar spondylosis. 3. Polyneuropathy.  Tx Mobic, Vicodin 5/325 two times a day.   2. HTN: Rx: lisinopril. has not been taking. denies CP, SOB, N/V/D, LE edema, HA, dizziness, vision changes.  Habits & Providers  Alcohol-Tobacco-Diet     Tobacco Status: current     Tobacco Counseling: to quit use of tobacco products     Cigarette Packs/Day: 0.5  Current Medications (verified): 1)  Mobic 15 Mg Tabs (Meloxicam) 2)  Vicodin 5-500 Mg Tabs (Hydrocodone-Acetaminophen) .... One By Mouth Two Times A Day As Needed Pain 3)  Lisinopril 20 Mg Tabs (Lisinopril)  .... 1/2 Tablet By Mouth Daily 4)  Simvastatin 20 Mg Tabs (Simvastatin) .Marland Kitchen.. 1 By Mouth At Bedtime 5)  Ventolin Hfa 108 (90 Base) Mcg/act Aers (Albuterol Sulfate) .... 2 Puffs Q 4-6 Hours As Needed Wheeze 6)  Trazodone Hcl 50 Mg Tabs (Trazodone Hcl) .... 1/2 By Mouth At Bedtime As Needed Insomnia  Allergies (verified): No Known Drug Allergies  Social History: Packs/Day:  0.5  Review of Systems General:  Denies chills and fever. CV:  Denies chest pain or discomfort, shortness of breath with exertion, and swelling of feet. MS:  Complains of joint pain and low back pain; denies joint redness, joint swelling, and loss of strength. Neuro:  Complains of numbness and tingling; denies falling down.  Physical Exam  General:  Well-developed,well-nourished, in no acute distress; alert, appropriate and cooperative throughout examination. Vital signs reviewed. Lungs:  Decreased breath sounds bilaterally. Heart:  RRR no m/r/g. Msk:  Back: FROM. negative SLR, slight ttp along spinus processes of lumbar spine, hypertonic lumbar paraspinal mm, good strength LE, normal reflexes. Decreased lumbar lordosis.  Neurologic:  alert & oriented X3, cranial nerves II-XII intact, strength normal in all extremities, sensation intact to light touch, gait normal, and DTRs symmetrical and normal.   Psych:  Oriented X3, memory intact for recent and remote, and good eye contact.     Impression & Recommendations:  Problem # 1:  BACK PAIN (ICD-724.5) Assessment Unchanged Will refer patient to another Pain Clinic. Until then, I will continue with the patient's pain regimen of Mobic and Vicodin with no early refills. Previous pain contract in effect. The following medications were removed from the medication list:    Flexeril 10 Mg Tabs (Cyclobenzaprine hcl) ..... One by mouth three times a day for muscle spasm His updated medication list for this problem includes:    Mobic 15 Mg Tabs (Meloxicam)    Vicodin 5-500 Mg  Tabs (Hydrocodone-acetaminophen) ..... One by mouth two times a day as needed pain  Problem # 2:  POLYNEUROPATHY (ICD-357.9) Assessment: Unchanged Rec no ETOH.   Problem # 3:  ESSENTIAL HYPERTENSION, BENIGN (ICD-401.1) Assessment: Deteriorated Restart medication. His updated medication list for this problem includes:    Lisinopril 20 Mg Tabs (Lisinopril) .Marland Kitchen... 1/2 tablet by mouth daily  Orders: [facility] ([id number])  Complete Medication List: 1)  Mobic 15 Mg Tabs (Meloxicam) 2)  Vicodin 5-500 Mg Tabs (Hydrocodone-acetaminophen) .... One by mouth two times a day as needed pain 3)  Lisinopril 20 Mg Tabs (Lisinopril) .... 1/2 tablet by mouth daily 4)  Simvastatin 20 Mg Tabs (Simvastatin) .Marland Kitchen.. 1 by mouth at bedtime 5)  Ventolin Hfa 108 (90 Base) Mcg/act Aers (Albuterol sulfate) .... 2 puffs q 4-6 hours as needed wheeze 6)  Trazodone Hcl 50 Mg Tabs (Trazodone hcl) .... 1/2 by mouth at bedtime as needed insomnia  Patient Instructions: 1)  It was nice to see you again. 2)  Please restart your medications. 3)  We will send you to another pain clinic. Prescriptions: TRAZODONE HCL 50 MG TABS (TRAZODONE HCL) 1/2 by mouth at bedtime as needed insomnia  #30 x 0   Entered and Authorized by:   Helane Rima DO   Signed by:   Helane Rima DO on 02/23/2010   Method used:   Print then Give to Patient   RxID:   [id number] LISINOPRIL 20 MG TABS (LISINOPRIL) 1/2 tablet by mouth daily  #30 x 3   Entered and Authorized by:   Helane Rima DO   Signed by:   Helane Rima DO on 02/23/2010   Method used:   Print then Give to Patient   RxID:   [id number] MOBIC 15 MG TABS (MELOXICAM)   #30 x 0   Entered and Authorized by:   Helane Rima DO   Signed by:   Helane Rima DO on 02/23/2010   Method used:   Print then Give to Patient   RxID:   [id number] VICODIN 5-500 MG TABS (HYDROCODONE-ACETAMINOPHEN) one by mouth two times a day as needed pain  #60 x 0   Entered and Authorized  by:   Helane Rima DO   Signed by:   Helane Rima DO on 02/23/2010   Method used:   Print then Give to Patient   RxID:   [id number] SIMVASTATIN 20 MG TABS (SIMVASTATIN) 1 by mouth at bedtime  #90 x 3   Entered and Authorized by:   Helane Rima DO   Signed by:   Helane Rima DO on 02/23/2010   Method used:   Print then Give to Patient   RxID:   [id number]   Prevention & Chronic Care Immunizations   Influenza vaccine: Not documented   Influenza vaccine deferral: Not indicated  (02/23/2010)    Tetanus booster: Not documented    Pneumococcal vaccine: Not documented  Colorectal Screening   Hemoccult: Not documented   Hemoccult action/deferral: Not indicated  (02/23/2010)  Colonoscopy: Not documented  Other Screening   PSA: Not documented   PSA action/deferral: Discussion deferred  (02/23/2010)   Smoking status: current  (02/23/2010)   Smoking cessation counseling: yes  (08/10/2009)  Lipids   Total Cholesterol: 234  (07/01/2009)   LDL: See Comment mg/dL  (18/84/1660)   LDL Direct: Not documented   HDL: 36  (07/01/2009)   Triglycerides: 493  (07/01/2009)    SGOT (AST): 22  (07/01/2009)   SGPT (ALT): 28  (07/01/2009) CMP ordered    Alkaline phosphatase: 92  (07/01/2009)   Total bilirubin: 0.5  (07/01/2009)    Lipid flowsheet reviewed?: Yes   Progress toward LDL goal: Unchanged  Hypertension   Last Blood Pressure: 169 / 93  (02/23/2010)   Serum creatinine: 1.12  (07/01/2009)   Serum potassium 4.8  (07/01/2009) CMP ordered     Hypertension flowsheet reviewed?: Yes   Progress toward BP goal: Deteriorated  Self-Management Support :   Personal Goals (by the next clinic visit) :      Personal blood pressure goal: 140/90  (07/14/2009)     Personal LDL goal: 130  (07/14/2009)    Patient will work on the following items until the next clinic visit to reach self-care goals:     Medications and monitoring: take my medicines every day, bring all of my  medications to every visit  (02/23/2010)     Eating: drink diet soda or water instead of juice or soda, eat more vegetables, use fresh or frozen vegetables, eat foods that are low in salt, eat baked foods instead of fried foods, eat fruit for snacks and desserts, limit or avoid alcohol  (02/23/2010)     Activity: take a 30 minute walk every day, take the stairs instead of the elevator, park at the far end of the parking lot  (02/23/2010)    Hypertension self-management support: Written self-care plan  (02/23/2010)   Hypertension self-care plan printed.    Lipid self-management support: Written self-care plan  (02/23/2010)   Lipid self-care plan printed.  Appended Document: Orders Update    Clinical Lists Changes  Orders: Added new Test order of [facility]- Est  Level 4 (63016) - Signed

## 2010-12-12 NOTE — Assessment & Plan Note (Signed)
Summary: f/up,tcb   Vital Signs:  Patient profile:   55 year old male Height:      72 inches Weight:      213 pounds BMI:     28.99 Temp:     98.0 degrees F oral Pulse rate:   72 / minute BP sitting:   163 / 80  (left arm) Cuff size:   regular  Vitals Entered By: Tessie Fass CMA (May 16, 2010 2:33 PM) CC: F/U grief, back pain, HTN Is Patient Diabetic? No Pain Assessment Patient in pain? yes     Location: lower back, bilateral leg Intensity: 7   Primary Care Provider:  Helane Rima DO  CC:  F/U grief, back pain, and HTN.  History of Present Illness: 55 year old male:  1. Grief: Patient's wife recently died from endometrial cancer. Pt is connected with bereavement counseling. No SI/HI. Requests something to help his nerves.  2. Back Pain: History: He states that he has tried ibuprofen, naproxyn, ultram, flexeril, SOMA, and neurontin but they all made his "legs twitch." he states that PT "didn't help." endorses pain down bilateral legs. denies bowel or bladder incontinence, recent trauma, prolonged corticosteroid use.  states that Mobic helps some, but Vicodin is the only thing that really controls the pain. Xray lumbar spine: no evidence of acute bony abnormality or significant degenerative change.  there are small osteophytes extending from the vertebral bodies of the lumbar spine. patient was sent to Pain Clinic for evaluation and treatment. review of Pain Clinic notes: he had an EMG showing neuropathy. he was Dx with Myofascial pain syndrome. 2. Lumbar spondylosis. 3. Polyneuropathy.  Tx Mobic, Vicodin 5/325 three times a day.   3. HTN: Rx: Lisinopril. Denies CP, SOB, N/V/D, LE edema, HA, dizziness, vision changes.  Habits & Providers  Alcohol-Tobacco-Diet     Tobacco Status: current     Tobacco Counseling: to quit use of tobacco products     Cigarette Packs/Day: 1.0  Current Medications (verified): 1)  Mobic 15 Mg Tabs (Meloxicam) 2)  Vicodin 5-500 Mg Tabs  (Hydrocodone-Acetaminophen) .... One By Mouth Three Times A Day As Needed Pain 3)  Lisinopril 20 Mg Tabs (Lisinopril) .Marland Kitchen.. 1 Tablet By Mouth Daily 4)  Simvastatin 20 Mg Tabs (Simvastatin) .Marland Kitchen.. 1 By Mouth At Bedtime 5)  Ventolin Hfa 108 (90 Base) Mcg/act Aers (Albuterol Sulfate) .... 2 Puffs Q 4-6 Hours As Needed Wheeze 6)  Trazodone Hcl 50 Mg Tabs (Trazodone Hcl) .... 1/2 By Mouth At Bedtime As Needed Insomnia 7)  Ativan 1 Mg Tabs (Lorazepam) .... 1/2 To 1 Daily As Needed For Grief  Allergies (verified): No Known Drug Allergies PMH-FH-SH reviewed for relevance  Review of Systems  The patient denies fever, chest pain, dyspnea on exertion, peripheral edema, prolonged cough, headaches, abdominal pain, muscle weakness, suspicious skin lesions, difficulty walking, and unusual weight change.    Physical Exam  General:  Well-developed,well-nourished, in no acute distress; alert, appropriate and cooperative throughout examination. Vital signs reviewed. Lungs:  Decreased breath sounds bilaterally. Heart:  RRR no m/r/g. Msk:  Back: FROM. negative SLR, slight ttp along spinus processes of lumbar spine, hypertonic lumbar paraspinal mm, good strength LE, normal reflexes. Decreased lumbar lordosis.  Pulses:  2+ DP. Extremities:  No edema. Neurologic:  Alert & oriented X3, cranial nerves II-XII intact, strength normal in all extremities, sensation intact to light touch, gait normal, and DTRs symmetrical and normal.   Psych:  Oriented X3, memory intact for recent and remote, normally  interactive, good eye contact, not anxious appearing, and tearful when talking about his wife.     Impression & Recommendations:  Problem # 1:  GRIEF REACTION (ICD-309.0) Assessment Deteriorated  No SI/HI. Patient has counseling through hospice but declines at this time. He has kept in touch with me throughout this process (see previous phone notes re: wife's death). Discussed natural process of grieving. He has support  and is moving in with his daughter. Will Rx one time supply of Ativan with NO REFILLS. Discussed this with patient at length. He endorsed understanding and agrees.  Orders: [facility]- Est  Level 4 (16109)  Problem # 2:  POLYNEUROPATHY (ICD-357.9) Assessment: Unchanged  Continue pain control with Vicodin. Rx given today.  Orders: [facility]- Est  Level 4 (60454)  Problem # 3:  ESSENTIAL HYPERTENSION, BENIGN (ICD-401.1) Assessment: Deteriorated  Uncontrolled today. Patient has not taken medication today. Will not change Rx. His updated medication list for this problem includes:    Lisinopril 20 Mg Tabs (Lisinopril) .Marland Kitchen... 1 tablet by mouth daily  Orders: [facility]- Est  Level 4 (09811)  Problem # 4:  TOBACCO ABUSE (ICD-305.1) Assessment: Unchanged  Declines cessation today.  Orders: [facility]- Est  Level 4 (99214)  Complete Medication List: 1)  Mobic 15 Mg Tabs (Meloxicam) 2)  Vicodin 5-500 Mg Tabs (Hydrocodone-acetaminophen) .... One by mouth three times a day as needed pain 3)  Lisinopril 20 Mg Tabs (Lisinopril) .Marland Kitchen.. 1 tablet by mouth daily 4)  Simvastatin 20 Mg Tabs (Simvastatin) .Marland Kitchen.. 1 by mouth at bedtime 5)  Ventolin Hfa 108 (90 Base) Mcg/act Aers (Albuterol sulfate) .... 2 puffs q 4-6 hours as needed wheeze 6)  Trazodone Hcl 50 Mg Tabs (Trazodone hcl) .... 1/2 by mouth at bedtime as needed insomnia 7)  Ativan 1 Mg Tabs (Lorazepam) .... 1/2 to 1 daily as needed for grief  Patient Instructions: 1)  It was nice to see you again. 2)  I am sorry to hear about your wife.  Prescriptions: VICODIN 5-500 MG TABS (HYDROCODONE-ACETAMINOPHEN) one by mouth three times a day as needed pain  #90 x 0   Entered and Authorized by:   Helane Rima DO   Signed by:   Helane Rima DO on 05/16/2010   Method used:   Print then Give to Patient   RxID:   [id number] ATIVAN 1 MG TABS (LORAZEPAM) 1/2 to 1 daily as needed for grief  #24 x 0   Entered and Authorized by:   Helane Rima DO   Signed by:   Helane Rima DO on 05/16/2010   Method used:   Print then Give to Patient   RxID:   [id number]   Prevention & Chronic Care Immunizations   Influenza vaccine: Not documented   Influenza vaccine deferral: Not indicated  (02/23/2010)    Tetanus booster: Not documented    Pneumococcal vaccine: Not documented  Colorectal Screening   Hemoccult: Not documented   Hemoccult action/deferral: Not indicated  (02/23/2010)    Colonoscopy: Not documented   Colonoscopy action/deferral: Refused  (03/30/2010)  Other Screening   PSA: Not documented   PSA action/deferral: Discussion deferred  (02/23/2010)   Smoking status: current  (05/16/2010)   Smoking cessation counseling: yes  (08/10/2009)  Lipids   Total Cholesterol: 234  (07/01/2009)   LDL: See Comment mg/dL  (40/08/2724)   LDL Direct: Not documented   HDL: 36  (07/01/2009)   Triglycerides: 493  (07/01/2009)    SGOT (AST): 17  (02/23/2010)   SGPT (ALT): 19  (  02/23/2010)   Alkaline phosphatase: 85  (02/23/2010)   Total bilirubin: 0.4  (02/23/2010)    Lipid flowsheet reviewed?: Yes   Progress toward LDL goal: Unchanged  Hypertension   Last Blood Pressure: 163 / 80  (05/16/2010)   Serum creatinine: 1.06  (02/23/2010)   Serum potassium 4.1  (02/23/2010)    Hypertension flowsheet reviewed?: Yes   Progress toward BP goal: Deteriorated   Hypertension comments: Patient did not take medicaiton today.  Self-Management Support :   Personal Goals (by the next clinic visit) :      Personal blood pressure goal: 140/90  (07/14/2009)     Personal LDL goal: 130  (07/14/2009)    Patient will work on the following items until the next clinic visit to reach self-care goals:     Medications and monitoring: take my medicines every day, bring all of my medications to every visit  (05/16/2010)     Eating: drink diet soda or water instead of juice or soda, eat more vegetables, use fresh or frozen vegetables, eat foods that are low in salt, eat  baked foods instead of fried foods, eat fruit for snacks and desserts, limit or avoid alcohol  (05/16/2010)     Activity: take a 30 minute walk every day, take the stairs instead of the elevator, park at the far end of the parking lot  (05/16/2010)    Hypertension self-management support: Written self-care plan  (05/16/2010)   Hypertension self-care plan printed.    Lipid self-management support: Written self-care plan  (05/16/2010)   Lipid self-care plan printed.

## 2010-12-12 NOTE — Assessment & Plan Note (Signed)
Summary: FU BACK/KH   Vital Signs:  Patient profile:   55 year old male Weight:      225.4 pounds BMI:     30.68 Temp:     98.2 degrees F Pulse rate:   87 / minute BP sitting:   140 / 88  (right arm)  Vitals Entered By: Starleen Blue RN (August 10, 2009 2:29 PM) CC: f/u Is Patient Diabetic? No Pain Assessment Patient in pain? yes     Location: back Intensity: 8   Primary Care Provider:  Helane Rima DO  CC:  f/u.  History of Present Illness: 55 year old WM  1. Back Pain: x several years, has been told before that it is due to OA. he states that he has tried ibuprofen, naproxyn, ultram, flexeril, SOMA, and neurontin but they all made his "legs twitch." he states that PT "didn't help." endorses pain down bilateral legs. denies bowel or bladder incontinence, recent trauma, prolonged corticosteroid use, weakness/numbness/tingling in LE. states that Mobic and Nortriptyline help some, but Vicodin is the only thing that really controls the pain. he was seen in the [facility] for evaluation and Dx with muscle spasms. narcotics NOT recommended for his pain. an Xray was also obtained of the lumbar spine: No evidence of acute bony abnormality or significant degenerative change.  There are small osteophytes extending from the vertebral bodies of the lumbar spine.  2. HTN: borderline normal today. Rx: lisinopril. patient does not want to increase to 1 tab daily yet. He denies CP, SOB, N/V/D, LE edema, HA, dizziness, vision changes.  3. Skin Rash: bilateral legs from ankle to thigh, with some lesions on back, x 2 years, itchy, flakes, this has never been evaluated before, he has tried nothing to make better. + KOH and LFTs WNL so was Tx with antifungal at last visit. today, he states that the itching has gone away but that he still has the lesions. also has skin discoloration and itch bilateral groin.    Habits & Providers  Alcohol-Tobacco-Diet     Tobacco Status: current  Cigarette Packs/Day: 1.0  Current Medications (verified): 1)  Mobic 7.5 Mg Tabs (Meloxicam) .... One By Mouth Daily 2)  Nortriptyline Hcl 50 Mg Caps (Nortriptyline Hcl) .... One By Mouth Q Hs 3)  Hydrocodone-Acetaminophen 5-500 Mg Tabs (Hydrocodone-Acetaminophen) .... One By Mouth Two Times A Day - With Instructions To Wean 4)  Lisinopril 20 Mg Tabs (Lisinopril) .... 1/2 Tablet By Mouth Daily 5)  Lovastatin 20 Mg Tabs (Lovastatin) .... One By Mouth Daily 6)  Nystatin 100000 Unit/gm Crea (Nystatin) .... Apply To Affected Area (Groin) Two Times A Day For Rash 7)  Flexeril 10 Mg Tabs (Cyclobenzaprine Hcl) .... One By Mouth Three Times A Day For Muscle Spasm 8)  Requip 0.5 Mg Tabs (Ropinirole Hcl) .... One Half Tab By Mouth Q Pm X 2 Days, Then 1 Tab By Mouth Q Pm  Allergies (verified): No Known Drug Allergies  Past History:  Past medical, surgical, family and social histories (including risk factors) reviewed, and no changes noted (except as noted below).  Past Medical History: Reviewed history from 06/20/2009 and no changes required. Back pain Hx of kidney stone Arthritis in multiple joints Right rotator cuff injury  Family History: Reviewed history from 06/20/2009 and no changes required. Family History Diabetes 1st degree relative- brother Twin brother died 2009-04-12 from suspected overdose.  Social History: Reviewed history from 06/20/2009 and no changes required. Lives with wife and daughter born in 62.  Pt is currently unemployeed.  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 Smokes 1 ppd x 35 years, and drinks approximately 3 beers per day.  Uses no illicit drugs.  Walks or swims for exercise. He is primary caregiver for wife (has ovarian Ca, Dx in 1995, chemo at [facility]).  Review of Systems       per HPI   Physical Exam  General:  Well-developed,well-nourished, in no acute distress; alert, appropriate and cooperative throughout examination. Vital  signs reviewed. Lungs:  CTAB no wheeze. Heart:  RRR. Msk:  Back: FROM. negative SLR, slight ttp along spinus processes of lumbar spine, hypertonic lumbar paraspinal mm, good strength LE, normal reflexes. decreased lumbar lordosis.  Pulses:  2+ dp. Extremities:  No edema. Skin:  Bilateral LE with multiple flesh-colored, papular with crusted surface, pruritic. some lesions with verruca apearance. identical lesions identified on back. Pink colored area at groin, slightly raised, no satellite lesions, woods light: no redness.   Impression & Recommendations:  Problem # 1:  BACK PAIN (ICD-724.5) Assessment Unchanged Discussed evaluation and recommendations by [facility]. I agree that narcotics not indicated in this patient and informed the patient that I would no longer prescribe narcotics for his back pain. Will Rx Flexeril to see if this helps the muscle spasms. Upon further questioning re: the patient's "twitch," it seems that this happens at night and regardless of what meds he takes, so I will prescribe a trial of Requip to see if this is actually PLMS. Patient expressed understanding. He will find a pain doctor if the Flexeril is not working. His updated medication list for this problem includes:    Mobic 7.5 Mg Tabs (Meloxicam) ..... One by mouth daily    Hydrocodone-acetaminophen 5-500 Mg Tabs (Hydrocodone-acetaminophen) ..... One by mouth two times a day - with instructions to wean    Flexeril 10 Mg Tabs (Cyclobenzaprine hcl) ..... One by mouth three times a day for muscle spasm  Orders: [facility]- Est  Level 4 (62952)  Problem # 2:  ESSENTIAL HYPERTENSION, BENIGN (ICD-401.1) Assessment: Unchanged  His updated medication list for this problem includes:    Lisinopril 20 Mg Tabs (Lisinopril) .Marland Kitchen... 1/2 tablet by mouth daily  Orders: [facility]- Est  Level 4 (84132)  Problem # 3:  HYPERLIPIDEMIA (ICD-272.4) Assessment: Unchanged  His updated medication list for this problem includes:    Lovastatin 20  Mg Tabs (Lovastatin) ..... One by mouth daily  Orders: [facility]- Est  Level 4 (44010)  Problem # 4:  SKIN RASH (ICD-782.1) Assessment: Unchanged Instructed patient to come back in 1 week for Bx of one of the lesions on his legs. + candidal rash on groin so will treat with topical cream until other rash diagnosis confirmed by pathology.   His updated medication list for this problem includes:    Nystatin 100000 Unit/gm Crea (Nystatin) .Marland Kitchen... Apply to affected area (groin) two times a day for rash  Orders: [facility] (27253) [facility]- Est  Level 4 (66440)  Problem # 5:  TOBACCO ABUSE (ICD-305.1) Assessment: Unchanged Not ready to quit. Orders: [facility]- Est  Level 4 (99214)  Problem # 6:  OBESITY (ICD-278.00) Assessment: Unchanged  Orders: [facility]- Est  Level 4 (99214)  Rec healthy diet and exercise for weight loss.  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 One by mouth daily 2)  Nortriptyline Hcl 50 Mg Caps (Nortriptyline hcl) .... One by mouth q hs 3)  Hydrocodone-acetaminophen 5-500 Mg Tabs (Hydrocodone-acetaminophen) .... One by mouth two times a day - with instructions to wean 4)  Lisinopril 20 Mg Tabs (Lisinopril) .... 1/2 tablet by mouth daily 5)  Lovastatin 20 Mg Tabs (Lovastatin) .... One by mouth daily 6)  Nystatin 100000 Unit/gm Crea (Nystatin) .... Apply to affected area (groin) two times a day for rash 7)  Flexeril 10 Mg Tabs (Cyclobenzaprine hcl) .... One by mouth three times a day for muscle spasm 8)  Requip 0.5 Mg Tabs (Ropinirole hcl) .... One half tab by mouth q pm x 2 days, then 1 tab by mouth q pm 9)  Doxycycline Hyclate 100 Mg Caps (Doxycycline hyclate) .... Take 1 tab twice a day 10)  Prednisone 20 Mg Tabs (Prednisone) .... Two by mouth daily x 5 days 11)  Tessalon Perles 100 Mg Caps (Benzonatate) .Marland Kitchen.. 1-2 by mouth three times a day  as needed for cough 12)  Ventolin Hfa 108 (90 Base) Mcg/act Aers (Albuterol sulfate) .... 2 puffs q 4-6 hours as needed wheeze  Patient Instructions: 1)  I am going to prescribe a topical medicine for the rash in your groin area. We may need to get a biopsy of one of the itchy bumps on your legs next time you come in. 2)  For your pain: Narcotics are not the right treatment for your back pain. I will prescribe one more months worth so that you can wean off of it. I will prescribe Flexeril or Ultram, which are muscle relaxors since your pain seems to be caused by muscle spasms.  3)  You limb twitching may be caused by period limb movement disorder. I will prescribe Requip to see if it helps. 4)  Follow up in 1 month. Prescriptions: REQUIP 0.5 MG TABS (ROPINIROLE HCL) one half tab by mouth q pm x 2 days, then 1 tab by mouth q pm  #30 x 0   Entered and Authorized by:   Helane Rima MD   Signed by:   Helane Rima MD on 08/10/2009   Method used:   Print then Give to Patient   RxID:   [id number] HYDROCODONE-ACETAMINOPHEN 5-500 MG TABS (HYDROCODONE-ACETAMINOPHEN) one by mouth two times a day - with instructions to wean  #60 x 0   Entered and Authorized by:   Helane Rima MD   Signed by:   Helane Rima MD on 08/10/2009   Method used:   Print then Give to Patient   RxID:   [id number] FLEXERIL 10 MG TABS (CYCLOBENZAPRINE HCL) one by mouth three times a day for muscle spasm  #90 x 0   Entered and Authorized by:   Helane Rima MD   Signed by:   Helane Rima MD on 08/10/2009   Method used:   Print then Give to Patient   RxID:   [id number] NYSTATIN 100000 UNIT/GM CREA (NYSTATIN) apply to affected area (groin) two times a day for rash  #1 tub x 3   Entered and Authorized by:   Helane Rima MD   Signed by:   Helane Rima MD on 08/10/2009   Method used:   Print then Give to Patient   RxID:   [id number]   Prevention & Chronic Care Immunizations   Influenza vaccine: Not  documented    Tetanus booster: Not documented    Pneumococcal vaccine: Not documented  Colorectal Screening   Hemoccult: Not documented  Colonoscopy: Not documented  Other Screening   PSA: Not documented   Smoking status: current  (08/10/2009)   Smoking cessation counseling: yes  (08/10/2009)  Lipids   Total Cholesterol: 234  (07/01/2009)   LDL: See Comment mg/dL  (09/08/2535)   LDL Direct: Not documented   HDL: 36  (07/01/2009)   Triglycerides: 493  (07/01/2009)    SGOT (AST): 22  (07/01/2009)   SGPT (ALT): 28  (07/01/2009)   Alkaline phosphatase: 92  (07/01/2009)   Total bilirubin: 0.5  (07/01/2009)    Lipid flowsheet reviewed?: Yes   Progress toward LDL goal: Unchanged  Hypertension   Last Blood Pressure: 140 / 88  (08/10/2009)   Serum creatinine: 1.12  (07/01/2009)   Serum potassium 4.8  (07/01/2009)    Hypertension flowsheet reviewed?: Yes   Progress toward BP goal: Unchanged  Self-Management Support :   Personal Goals (by the next clinic visit) :      Personal blood pressure goal: 140/90  (07/14/2009)     Personal LDL goal: 130  (07/14/2009)    Patient will work on the following items until the next clinic visit to reach self-care goals:     Medications and monitoring: take my medicines every day, check my blood pressure, bring all of my medications to every visit  (08/10/2009)     Eating: drink diet soda or water instead of juice or soda, eat more vegetables, use fresh or frozen vegetables, eat foods that are low in salt, eat baked foods instead of fried foods, eat fruit for snacks and desserts, limit or avoid alcohol  (08/10/2009)     Activity: take a 30 minute walk every day  (08/10/2009)    Hypertension self-management support: Written self-care plan  (08/10/2009)   Hypertension self-care plan printed.    Lipid self-management support: Written self-care plan  (08/10/2009)   Lipid self-care plan printed.

## 2010-12-14 NOTE — Assessment & Plan Note (Signed)
Summary: f/u refill meds/bmc   Vital Signs:  Patient profile:   55 year old male Height:      72 inches Weight:      227.5 pounds BMI:     30.97 Temp:     98.3 degrees F oral Pulse rate:   85 / minute BP sitting:   142 / 91  Vitals Entered By: Golden Circle RN (November 29, 2010 3:17 PM)  Serial Vital Signs/Assessments:  Time      Position  BP       Pulse  Resp  Temp     By                     134/85                         Helane Rima DO   Primary Care Provider:  Helane Rima DO   History of Present Illness: 55 year old male:  1. Back Pain: Pain controlled on current regimen. He is now exercising more. History: He states that he has tried ibuprofen, naproxyn, ultram, flexeril, SOMA, and neurontin but they all made his "legs twitch." he states that PT "didn't help." endorses pain down bilateral legs. denies bowel or bladder incontinence, recent trauma, prolonged corticosteroid use.  states that Mobic helps some, but Vicodin is the only thing that really controls the pain. Xray lumbar spine: no evidence of acute bony abnormality or significant degenerative change.  there are small osteophytes extending from the vertebral bodies of the lumbar spine. patient was sent to Pain Clinic for evaluation and treatment. review of Pain Clinic notes: he had an EMG showing neuropathy. he was Dx with Myofascial pain syndrome. 2. Lumbar spondylosis. 3. Polyneuropathy. Tx Mobic, Vicodin 5/325 three times a day.   2. HLD: Has still not started medication 2/2 finances. Reviewed previous FLP - much improved, but still high triglycerides. 12/2009 CMP WNL.  3. HTN: Rx: Lisinopril. Will only take 1/2 tab and refuses to take more. Denies CP, SOB, N/V/D, LE edema, HA, dizziness, vision changes.    Habits & Providers  Alcohol-Tobacco-Diet     Alcohol drinks/day: 2     Alcohol type: beers     Tobacco Status: current     Tobacco Counseling: to quit use of tobacco products     Cigarette Packs/Day:  1.0  Exercise-Depression-Behavior     Seat Belt Use: always  Current Medications (verified): 1)  Mobic 15 Mg Tabs (Meloxicam) .... One By Mouth Daily 2)  Vicodin 5-500 Mg Tabs (Hydrocodone-Acetaminophen) .... One By Mouth Three Times A Day As Needed Pain 3)  Lisinopril 20 Mg Tabs (Lisinopril) .Marland Kitchen.. 1 Tablet By Mouth Daily 4)  Trazodone Hcl 100 Mg Tabs (Trazodone Hcl) .... 1.5 By Mouth Q Hs 5)  Zyrtec Allergy 10 Mg  Tabs (Cetirizine Hcl) .Marland Kitchen.. 1 Once Daily Prn 6)  Simvastatin 20 Mg Tabs (Simvastatin) .Marland Kitchen.. 1 By Mouth At Bedtime 7)  Ranitidine Hcl 150 Mg Caps (Ranitidine Hcl) .... Bid  Allergies (verified): No Known Drug Allergies PMH-FH-SH reviewed for relevance  Social History: Risk analyst Use:  always  Review of Systems      See HPI  Physical Exam  General:  Well-developed,well-nourished, in no acute distress; alert, appropriate and cooperative throughout examination. Vital signs reviewed. Lungs:  Decreased breath sounds bilaterally. Heart:  RRR no m/r/g. Abdomen:  Soft, non-tender, and normal bowel sounds.   Msk:  Back: FROM. negative SLR, slight ttp  along spinus processes of lumbar spine, hypertonic lumbar paraspinal mm, good strength LE, normal reflexes. Decreased lumbar lordosis.  Pulses:  2+ DP. Extremities:  No edema.   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 His updated medication list for this problem includes:    Mobic 15 Mg Tabs (Meloxicam) ..... One by mouth daily    Vicodin 5-500 Mg Tabs (Hydrocodone-acetaminophen) ..... One by mouth three times a day as needed pain  Orders: [facility]- Est  Level 4 (04540)  Problem # 2:  HYPERLIPIDEMIA (ICD-272.4) Assessment: Unchanged  Advised patient to start medication and gave list of pharmacies with prices of medication. His updated medication list for this problem includes:    Simvastatin 20 Mg Tabs (Simvastatin) .Marland Kitchen... 1 by mouth at  bedtime  Orders: [facility]- Est  Level 4 (99214)  Problem # 3:  ESSENTIAL HYPERTENSION, BENIGN (ICD-401.1) Assessment: Unchanged  Continue current treatment. Discussed exercise and lower salt intake. His updated medication list for this problem includes:    Lisinopril 20 Mg Tabs (Lisinopril) .Marland Kitchen... 1 tablet by mouth daily  Orders: [facility]- Est  Level 4 (98119)  Problem # 4:  OBESITY (ICD-278.00) Assessment: Deteriorated  Rec daily exercise.  Orders: [facility]- Est  Level 4 (14782)  Problem # 5:  TOBACCO ABUSE (ICD-305.1) Assessment: Unchanged  Patient not ready to quit.  Orders: [facility]- Est  Level 4 (99214)  Complete Medication List: 1)  Mobic 15 Mg Tabs (Meloxicam) .... One by mouth daily 2)  Vicodin 5-500 Mg Tabs (Hydrocodone-acetaminophen) .... One by mouth three times a day as needed pain 3)  Lisinopril 20 Mg Tabs (Lisinopril) .Marland Kitchen.. 1 tablet by mouth daily 4)  Trazodone Hcl 100 Mg Tabs (Trazodone hcl) .... 1.5 by mouth q hs 5)  Zyrtec Allergy 10 Mg Tabs (Cetirizine hcl) .Marland Kitchen.. 1 once daily prn 6)  Simvastatin 20 Mg Tabs (Simvastatin) .Marland Kitchen.. 1 by mouth at bedtime 7)  Ranitidine Hcl 150 Mg Caps (Ranitidine hcl) .... Bid  Patient Instructions: 1)  It was nice to see you today. 2)  You are due for colonoscopy. Prescriptions: VICODIN 5-500 MG TABS (HYDROCODONE-ACETAMINOPHEN) one by mouth three times a day as needed pain  #90 x 0   Entered and Authorized by:   Helane Rima DO   Signed by:   Helane Rima DO on 11/29/2010   Method used:   Print then Give to Patient   RxID:   [id number] VICODIN 5-500 MG TABS (HYDROCODONE-ACETAMINOPHEN) one by mouth three times a day as needed pain  #90 x 0   Entered and Authorized by:   Helane Rima DO   Signed by:   Helane Rima DO on 11/29/2010   Method used:   Print then Give to Patient   RxID:   [id number] TRAZODONE HCL 100 MG TABS (TRAZODONE HCL) 1.5 by mouth q hs  #45 x 3   Entered and Authorized by:   Helane Rima DO   Signed by:    Helane Rima DO on 11/29/2010   Method used:   Print then Give to Patient   RxID:   [id number] VICODIN 5-500 MG TABS (HYDROCODONE-ACETAMINOPHEN) one by mouth three times a day as needed pain  #90 x 0   Entered and Authorized by:   Helane Rima DO   Signed by:   Helane Rima DO on 11/29/2010   Method used:   Print then Give to Patient   RxID:   [id number] MOBIC 15 MG TABS (  MELOXICAM) one by mouth daily  #30 x 3   Entered and Authorized by:   Helane Rima DO   Signed by:   Helane Rima DO on 11/29/2010   Method used:   Print then Give to Patient   RxID:   [id number]    Orders Added: 1)  [facility]- Est  Level 4 [69629]    Prevention & Chronic Care Immunizations   Influenza vaccine: Not documented   Influenza vaccine deferral: Refused  (09/06/2010)    Tetanus booster: 07/12/2010: Tdap    Pneumococcal vaccine: Not documented  Colorectal Screening   Hemoccult: Not documented   Hemoccult action/deferral: Not indicated  (02/23/2010)    Colonoscopy: Not documented   Colonoscopy action/deferral: Refused  (03/30/2010)  Other Screening   PSA: Not documented   PSA action/deferral: Discussion deferred  (02/23/2010)   Smoking status: current  (11/29/2010)   Smoking cessation counseling: yes  (08/10/2009)  Lipids   Total Cholesterol: 188  (07/12/2010)   Lipid panel action/deferral: Lipid Panel ordered   LDL: 103  (07/12/2010)   LDL Direct: Not documented   HDL: 34  (07/12/2010)   Triglycerides: 254  (07/12/2010)    SGOT (AST): 17  (02/23/2010)   SGPT (ALT): 19  (02/23/2010)   Alkaline phosphatase: 85  (02/23/2010)   Total bilirubin: 0.4  (02/23/2010)    Lipid flowsheet reviewed?: Yes   Progress toward LDL goal: Unchanged  Hypertension   Last Blood Pressure: 142 / 91  (11/29/2010)   Serum creatinine: 1.06  (02/23/2010)   Serum potassium 4.1  (02/23/2010)    Hypertension flowsheet reviewed?: Yes   Progress toward BP goal: At goal  Self-Management  Support :   Personal Goals (by the next clinic visit) :      Personal blood pressure goal: 140/90  (07/14/2009)     Personal LDL goal: 130  (07/14/2009)    Patient will work on the following items until the next clinic visit to reach self-care goals:     Medications and monitoring: take my medicines every day, bring all of my medications to every visit  (11/29/2010)     Eating: drink diet soda or water instead of juice or soda, eat more vegetables, use fresh or frozen vegetables, eat foods that are low in salt, eat baked foods instead of fried foods, eat fruit for snacks and desserts, limit or avoid alcohol  (11/29/2010)     Activity: take a 30 minute walk every day, take the stairs instead of the elevator, park at the far end of the parking lot  (11/29/2010)    Hypertension self-management support: Written self-care plan  (11/29/2010)   Hypertension self-care plan printed.    Lipid self-management support: Written self-care plan  (11/29/2010)   Lipid self-care plan printed.

## 2011-02-15 LAB — URINALYSIS, ROUTINE W REFLEX MICROSCOPIC
Bilirubin Urine: NEGATIVE
Hgb urine dipstick: NEGATIVE
Protein, ur: NEGATIVE mg/dL
Specific Gravity, Urine: 1.015 (ref 1.005–1.030)
Urobilinogen, UA: 1 mg/dL (ref 0.0–1.0)

## 2011-02-15 LAB — URINE CULTURE
Colony Count: NO GROWTH
Culture: NO GROWTH

## 2011-05-10 ENCOUNTER — Ambulatory Visit (INDEPENDENT_AMBULATORY_CARE_PROVIDER_SITE_OTHER): Payer: Self-pay | Admitting: Family Medicine

## 2011-05-10 ENCOUNTER — Telehealth: Payer: Self-pay | Admitting: *Deleted

## 2011-05-10 ENCOUNTER — Encounter: Payer: Self-pay | Admitting: Family Medicine

## 2011-05-10 VITALS — BP 146/83 | HR 98 | Temp 97.6°F | Wt 237.0 lb

## 2011-05-10 DIAGNOSIS — G4700 Insomnia, unspecified: Secondary | ICD-10-CM

## 2011-05-10 DIAGNOSIS — G8929 Other chronic pain: Secondary | ICD-10-CM

## 2011-05-10 DIAGNOSIS — R21 Rash and other nonspecific skin eruption: Secondary | ICD-10-CM

## 2011-05-10 DIAGNOSIS — J309 Allergic rhinitis, unspecified: Secondary | ICD-10-CM

## 2011-05-10 NOTE — Progress Notes (Signed)
  Subjective:    Patient ID: Todd Hayes, male    DOB: Jan 01, 1956, 55 y.o.   MRN: [id number]  HPI  1. Chronic Back Pain: Pain controlled on current regimen. He is now exercising more. History: He states that he has tried ibuprofen, naproxyn, ultram, flexeril, SOMA, and neurontin but they all made his "legs twitch." he states that PT "didn't help." endorses pain down bilateral legs. denies bowel or bladder incontinence, recent trauma, prolonged corticosteroid use.  states that Mobic helps some, but Vicodin is the only thing that really controls the pain. Xray lumbar spine: no evidence of acute bony abnormality or significant degenerative change.  there are small osteophytes extending from the vertebral bodies of the lumbar spine. patient was sent to Pain Clinic for evaluation and treatment. review of Pain Clinic notes: he had an EMG showing neuropathy. he was Dx with Myofascial pain syndrome. 2. Lumbar spondylosis. 3. Polyneuropathy. Tx Mobic, Vicodin three times a day.   2. Rash: Bilateral legs from ankle to thigh, with some lesions on arms and back, x 2 years, itchy, flakes, this has never been evaluated before, he has tried nothing to make better. + KOH and LFTs WNL so was Tx with antifungal at previous visits. Today, he states that the itching has gone away but that he still has the lesions.  Review of Systems SEE HPI.    Objective:   Physical Exam General:  Well-developed,well-nourished, in no acute distress; alert, appropriate and cooperative throughout examination. Vital signs reviewed. Lungs:  CTAB no wheeze. Heart:  RRR. Msk:  Back: FROM. negative SLR, slight ttp along spinus processes of lumbar spine, hypertonic lumbar paraspinal mm, good strength LE, normal reflexes. decreased lumbar lordosis.  Pulses:  2+ dp. Extremities:  No edema. Skin:  Bilateral LE with multiple flesh-colored, papular with crusted surface, pruritic. some lesions with verruca apearance. identical lesions  identified on back and arms.    Assessment & Plan:

## 2011-05-10 NOTE — Telephone Encounter (Signed)
Patient took a written script to [facility] today that was written by Dr. Earlene Plater for Trazadone 100 mg, dispense 90 and no refills.  Patient states that he usually get 45 pills with 3 refills.  Gave the pharmacist a verbal order to change to 45 and 3 refills.

## 2011-05-10 NOTE — Assessment & Plan Note (Signed)
Pain medication refilled x 3 months. He will not be due again until Sept 25.

## 2011-05-10 NOTE — Assessment & Plan Note (Signed)
Chronic. DDx includes lichen simplex chronicus. Checking RPR. Bx x 2 today of left thigh and right arm. Discussed risks/benefits, time out completed after consent obtained, areas both prepped with betadine/ETOH, local anesthesia with 1% lidocaine with epi, 3 mm puch bx in usual manner with no complications, pressure bandage applied to both areas, no complications, patient tolerated procedure well, bx sent to pathology.

## 2011-08-08 ENCOUNTER — Encounter: Payer: Self-pay | Admitting: Family Medicine

## 2011-08-08 ENCOUNTER — Ambulatory Visit (INDEPENDENT_AMBULATORY_CARE_PROVIDER_SITE_OTHER): Payer: Self-pay | Admitting: Family Medicine

## 2011-08-08 VITALS — BP 164/102 | HR 76 | Wt 238.0 lb

## 2011-08-08 DIAGNOSIS — E785 Hyperlipidemia, unspecified: Secondary | ICD-10-CM

## 2011-08-08 DIAGNOSIS — R21 Rash and other nonspecific skin eruption: Secondary | ICD-10-CM

## 2011-08-08 DIAGNOSIS — G8929 Other chronic pain: Secondary | ICD-10-CM

## 2011-08-08 DIAGNOSIS — G4700 Insomnia, unspecified: Secondary | ICD-10-CM

## 2011-08-08 DIAGNOSIS — I10 Essential (primary) hypertension: Secondary | ICD-10-CM

## 2011-08-08 LAB — COMPREHENSIVE METABOLIC PANEL
Albumin: 4.3 g/dL (ref 3.5–5.2)
Alkaline Phosphatase: 79 U/L (ref 39–117)
BUN: 15 mg/dL (ref 6–23)
Glucose, Bld: 99 mg/dL (ref 70–99)
Potassium: 4.4 mEq/L (ref 3.5–5.3)
Total Bilirubin: 0.5 mg/dL (ref 0.3–1.2)

## 2011-08-08 MED ORDER — HYDROCHLOROTHIAZIDE 25 MG PO TABS: 25.0000 mg | ORAL_TABLET | Freq: Every day | ORAL | Status: DC

## 2011-08-08 MED ORDER — HYDROCODONE-ACETAMINOPHEN 5-500 MG PO TABS: 1.0000 | ORAL_TABLET | Freq: Four times a day (QID) | ORAL | Status: DC | PRN

## 2011-08-08 NOTE — Assessment & Plan Note (Signed)
Results of bx not in results.  I will try to call the main hospital to try to track this down, before giving another treatment.

## 2011-08-08 NOTE — Assessment & Plan Note (Signed)
Controlled on current regimen, will fill for three months.  Talked about other medications including cymbalta but he does not think he would be able to afford.  Informed him of [facility] and possibility of getting enrolled of [facility] program.

## 2011-08-08 NOTE — Progress Notes (Signed)
  Subjective:    Patient ID: Todd Hayes, male    DOB: 1956/06/29, 55 y.o.   MRN: [id number]  HPI 1.  Chronic pain:  Pain controlled on current regimen.   History from prior note and confirmed with patient: He states that he has tried ibuprofen, naproxyn, ultram, flexeril, SOMA, and neurontin but they all made his "legs twitch." he states that PT "didn't help." endorses pain down bilateral legs. denies bowel or bladder incontinence, recent trauma, prolonged corticosteroid use. states that Mobic helps some, but Vicodin is the only thing that really controls the pain. Xray lumbar spine: no evidence of acute bony abnormality or significant degenerative change. there are small osteophytes extending from the vertebral bodies of the lumbar spine. patient was sent to Pain Clinic for evaluation and treatment. review of Pain Clinic notes: he had an EMG showing neuropathy. he was Dx with Myofascial pain syndrome. 2. Lumbar spondylosis. 3. Polyneuropathy. Tx Mobic, Vicodin three times a day.   2.  HTN:  BP elevated today.  He is taking his lisinopril.  Recheck of bp 146/88.  No change in diet or increased salt recently.  He denies chest pain, nausea, palpitations, headache, weakness. 3.  HLD:  Was prescribed simvastatin but never got filled because he could not afford this.   4.  Insomnia:  Continuing to have difficulty sleeping at night.  He is taking his trazodone prior to bed and it helps him initially fall asleep but he finds himself waking up 2-3 hours later.  When he wakes he usually takes another 1/2 tab and is able to fall back asleep.  He is requesting to increase to 200mg . 5.  Skin lesion:  Multiple skin lesions on arms, legs, and trunk.  States areas initially start out as red itchy bumps then go away and turn to brown spots.  Non painful.  No pus or drainage.  Tried antifungal tx in the past with little change. Had punch biopsy in June but never heard results.     Review of Systems     Objective:     Physical Exam  Constitutional: He is oriented to person, place, and time. He appears well-developed and well-nourished. No distress.  Neck: No thyromegaly present.  Cardiovascular: Normal rate, regular rhythm and normal heart sounds.   Pulmonary/Chest: Effort normal and breath sounds normal.  Neurological: He is alert and oriented to person, place, and time.  Skin:       Multiple red raised lesions on arms and legs.  Non tender and without warmth.  He does have multiple brown pigmented spots where he says spots were previously.  Back Exam: Non tender to palpation.  Painful with hyperextension and one legged hyperextension bilaterally.  SLR negative.         Assessment & Plan:

## 2011-08-08 NOTE — Patient Instructions (Signed)
It was nice meeting you today.  I would like for you to follow up in two weeks to recheck your blood pressure on the new medication.  You can increase your trazodone to 200mg  at bedtime to help with sleep.  I will try to find out the results of your biopsy this week.

## 2011-08-08 NOTE — Assessment & Plan Note (Signed)
BP elevated -Add HCTZ -Check CMET -Return in 2 weeks to recheck BP

## 2011-08-08 NOTE — Assessment & Plan Note (Signed)
Increase trazodone to 200 mg qhs  Increase trazodone to 200mg  qhs

## 2011-08-08 NOTE — Assessment & Plan Note (Signed)
-  Check D-LDL

## 2011-08-21 ENCOUNTER — Other Ambulatory Visit: Payer: Self-pay

## 2011-10-29 ENCOUNTER — Encounter: Payer: Self-pay | Admitting: Family Medicine

## 2011-10-29 ENCOUNTER — Ambulatory Visit (INDEPENDENT_AMBULATORY_CARE_PROVIDER_SITE_OTHER): Payer: Self-pay | Admitting: Family Medicine

## 2011-10-29 VITALS — BP 135/90 | HR 90 | Ht 72.5 in | Wt 242.0 lb

## 2011-10-29 DIAGNOSIS — H9209 Otalgia, unspecified ear: Secondary | ICD-10-CM

## 2011-10-29 DIAGNOSIS — G8929 Other chronic pain: Secondary | ICD-10-CM

## 2011-10-29 DIAGNOSIS — G4700 Insomnia, unspecified: Secondary | ICD-10-CM

## 2011-10-29 MED ORDER — HYDROCODONE-ACETAMINOPHEN 5-500 MG PO TABS: 1.0000 | ORAL_TABLET | Freq: Four times a day (QID) | ORAL | Status: DC | PRN

## 2011-10-29 MED ORDER — MELOXICAM 15 MG PO TABS: 15.0000 mg | ORAL_TABLET | Freq: Every day | ORAL | Status: DC

## 2011-10-29 MED ORDER — TRAZODONE HCL 100 MG PO TABS: 200.0000 mg | ORAL_TABLET | Freq: Every day | ORAL | Status: DC

## 2011-10-29 MED ORDER — METHOCARBAMOL 750 MG PO TABS: 750.0000 mg | ORAL_TABLET | Freq: Three times a day (TID) | ORAL | Status: DC

## 2011-10-29 MED ORDER — ANTIPYRINE-BENZOCAINE 5.4-1.4 % OT SOLN: 3.0000 [drp] | OTIC | Status: AC | PRN

## 2011-11-01 ENCOUNTER — Ambulatory Visit: Payer: Self-pay | Admitting: Family Medicine

## 2011-11-07 ENCOUNTER — Other Ambulatory Visit: Payer: Self-pay | Admitting: Family Medicine

## 2011-11-07 NOTE — Telephone Encounter (Signed)
Refill request for Dr. Ashley Royalty patient.

## 2011-11-12 DIAGNOSIS — H9209 Otalgia, unspecified ear: Secondary | ICD-10-CM | POA: Insufficient documentation

## 2011-11-12 NOTE — Assessment & Plan Note (Signed)
Taking trazodone 200mg , doing well on this.  WIll continue.

## 2011-11-12 NOTE — Progress Notes (Signed)
  Subjective:    Patient ID: Todd Hayes, male    DOB: 07/07/1956, 55 y.o.   MRN: [id number]  HPI  1. Back pain:  Here for refill on pain medication, vicodin and meloxicam.  Pain controlled on current regimen.  He states that he has tried ibuprofen, naproxyn, ultram, flexeril, SOMA, and neurontin but they all made his "legs twitch." he states that PT "didn't help." endorses pain down bilateral legs.  Denies bowel or bladder incontinence. Per previous pain clinic notes.   He had an EMG showing neuropathy and hee was Dx with Myofascial pain syndrome. 2. Lumbar spondylosis. 3. Polyneuropathy. Tx Mobic, Vicodin 5/325 three times a day. States his brother seen at [facility] and gets percocet for pain.  2. Insomnia:  History of insomnia, currenlty on trazodone.  Up to 200mg  qhs now, working well.  Still complains of waking up most nights, but able to fall back asleep farily easily.  Denies daytime sleepiness.  3. Ear pain:  Complains of almost daily ear pain.  Requesting refill on antibiotic ear drops.  States most of his pain feels like it is in the canal but sometimes "hurts deeper"  Denies discharge or bleeding from the ear or loss of hearng.    Review of Systems Per HPI    Objective:   Physical Exam  Constitutional: He appears well-developed and well-nourished. No distress.  HENT:  Right Ear: Tympanic membrane, external ear and ear canal normal.  Left Ear: Tympanic membrane, external ear and ear canal normal.  Cardiovascular: Normal rate, regular rhythm and normal heart sounds.   Pulmonary/Chest: Effort normal and breath sounds normal. No respiratory distress.  Musculoskeletal: Normal range of motion. He exhibits no edema and no tenderness.       Gait normal, SLR + for pain, negative for radicular signs.          Assessment & Plan:

## 2011-11-12 NOTE — Assessment & Plan Note (Signed)
Pain currently controlled on current medications.  Meloxicam and vicodin refilled

## 2011-12-31 ENCOUNTER — Other Ambulatory Visit: Payer: Self-pay | Admitting: Family Medicine

## 2011-12-31 NOTE — Telephone Encounter (Signed)
Refill request

## 2012-01-23 ENCOUNTER — Encounter: Payer: Self-pay | Admitting: Family Medicine

## 2012-01-23 ENCOUNTER — Ambulatory Visit (INDEPENDENT_AMBULATORY_CARE_PROVIDER_SITE_OTHER): Payer: Medicaid Other | Admitting: Family Medicine

## 2012-01-23 VITALS — BP 150/84 | HR 79 | Ht 72.0 in | Wt 244.0 lb

## 2012-01-23 DIAGNOSIS — H9209 Otalgia, unspecified ear: Secondary | ICD-10-CM

## 2012-01-23 DIAGNOSIS — G8929 Other chronic pain: Secondary | ICD-10-CM

## 2012-01-23 DIAGNOSIS — I10 Essential (primary) hypertension: Secondary | ICD-10-CM

## 2012-01-23 DIAGNOSIS — E785 Hyperlipidemia, unspecified: Secondary | ICD-10-CM

## 2012-01-23 LAB — LIPID PANEL: Total CHOL/HDL Ratio: 5.7 Ratio

## 2012-01-23 MED ORDER — HYDROCODONE-ACETAMINOPHEN 5-500 MG PO TABS: 1.0000 | ORAL_TABLET | Freq: Four times a day (QID) | ORAL | Status: DC | PRN

## 2012-01-23 MED ORDER — MELOXICAM 15 MG PO TABS: 15.0000 mg | ORAL_TABLET | Freq: Every day | ORAL | Status: DC

## 2012-01-23 MED ORDER — METHOCARBAMOL 750 MG PO TABS: 750.0000 mg | ORAL_TABLET | Freq: Three times a day (TID) | ORAL | Status: AC

## 2012-01-23 NOTE — Patient Instructions (Signed)
Thank you for coming in today, it was good to see you I will see you back in 3 months to f/u on your pain I will check with the pathologists office again about your previous biopsy.

## 2012-01-23 NOTE — Progress Notes (Signed)
  Subjective:    Patient ID: Todd Hayes, male    DOB: 06/07/1956, 56 y.o.   MRN: [id number]  HPI 1.  Here for f/u on chronic pain  Chronic Pain Follow-Up Assessment Chronic Pain Diagnosis: Back pain (Previous Pain Clinic notes: he had an EMG showing neuropathy. he was Dx with Myofascial pain syndrome. 2. Lumbar spondylosis. 3. Polyneuropathy. Tx Mobic, Vicodin 5/325 three times a day)  Also using robaxin as needed.  Pain scale rating at its BEST in the past 24 hours (1 to 10): 6-but can deal with this Pain scale rating at its WORST in the past 24 hours (1 to 10): 9 Adverse Effects:  (None_x_ Nausea__ Vomiting__ Confusion__ Sleepiness__ Fatigue__ Constipation__ Other__) Treatment of Adverse Effects: NA Since the last clinic visit, how much relief have pain treatment and medication provided? Please circle the one percentage that shows how much relief you have received: 0%__ 10%__ 20%__ 30%__ 40%__ 50%__ 60%__ 70%_x_80%__90%__ 100%__ Loraine Leriche the one number that describes how, during the past 24 hours, pain has interfered with your: General Activity 0__ 1__ 2__ 3__ 4__ 5_x_ 6__ 7__ 8__ 9__ 10__ Does not interfere      Completely interferes Mood 0__ 1__ 2__ 3__ 4_x_ 5__ 6__ 7__ 8__ 9__ 10__ Does not interfere      Completely interferes Ability to work (in or out of the home) 0__ 1__ 2__ 3__ 4__ 5__ 6__ 7__ 8__ 9__ 10__--->On disability Does not interfere      Completely interferes Interactions with other people 0__ 1__ 2_x_ 3__ 4__ 5__ 6__ 7__ 8__ 9__ 10__ Does not interfere      Completely interferes Sleep 0__ 1__ 2__ 3__ 4_x_ 5__ 6__ 7__ 8__ 9__ 10__ Does not interfere      Completely interferes  Enjoyment of life 0__ 1__ 2__ 3__ 4__ 5__ 6_x_ 7__ 8__ 9__ 10__ Does not interfere      Completely interferes  2. HTN:  BP still elevated.  Forgets to make medications sometimes. Has not taken today yet.  Denies that he needs refills on medications at this time.  Denies chest pain,  palpitations, headache.  Review of Systems     Objective:   Physical Exam  Constitutional: He is oriented to person, place, and time.       Obese, nad   Cardiovascular: Normal rate, regular rhythm and normal heart sounds.   Pulmonary/Chest: Effort normal and breath sounds normal. No respiratory distress.  Musculoskeletal:       TTP along lower back, SLR negative bilaterally.  Mild spasm noted bilaterally at L4-L5 level  Neurological: He is alert and oriented to person, place, and time.          Assessment & Plan:

## 2012-01-30 ENCOUNTER — Encounter: Payer: Self-pay | Admitting: Family Medicine

## 2012-02-10 NOTE — Assessment & Plan Note (Signed)
Current pain regimen seems to control pain pretty well.  Hydrocodone refilled, as well as robaxin and meloxicam.  Return in 3 months for f/u

## 2012-02-10 NOTE — Assessment & Plan Note (Signed)
BP elevated, encouraged to be compliant with medication.  No red flags today.

## 2012-02-24 ENCOUNTER — Other Ambulatory Visit: Payer: Self-pay | Admitting: Family Medicine

## 2012-02-26 ENCOUNTER — Encounter: Payer: Self-pay | Admitting: Family Medicine

## 2012-02-26 MED ORDER — SIMVASTATIN 40 MG PO TABS: 40.0000 mg | ORAL_TABLET | Freq: Every day | ORAL | Status: DC

## 2012-03-12 ENCOUNTER — Other Ambulatory Visit: Payer: Self-pay | Admitting: Family Medicine

## 2012-03-12 MED ORDER — LISINOPRIL 20 MG PO TABS: 20.0000 mg | ORAL_TABLET | Freq: Every day | ORAL | Status: DC

## 2012-04-15 ENCOUNTER — Ambulatory Visit (INDEPENDENT_AMBULATORY_CARE_PROVIDER_SITE_OTHER): Payer: Self-pay | Admitting: Family Medicine

## 2012-04-15 ENCOUNTER — Encounter: Payer: Self-pay | Admitting: Family Medicine

## 2012-04-15 VITALS — BP 130/89 | HR 91 | Ht 72.0 in | Wt 258.0 lb

## 2012-04-15 DIAGNOSIS — G8929 Other chronic pain: Secondary | ICD-10-CM

## 2012-04-15 DIAGNOSIS — E785 Hyperlipidemia, unspecified: Secondary | ICD-10-CM

## 2012-04-15 DIAGNOSIS — H6190 Disorder of external ear, unspecified, unspecified ear: Secondary | ICD-10-CM

## 2012-04-15 MED ORDER — TRAZODONE HCL 100 MG PO TABS: 100.0000 mg | ORAL_TABLET | Freq: Every day | ORAL | Status: DC

## 2012-04-15 MED ORDER — HYDROCODONE-ACETAMINOPHEN 5-500 MG PO TABS: 1.0000 | ORAL_TABLET | Freq: Four times a day (QID) | ORAL | Status: DC | PRN

## 2012-04-15 MED ORDER — PRAVASTATIN SODIUM 40 MG PO TABS: 40.0000 mg | ORAL_TABLET | Freq: Every day | ORAL | Status: DC

## 2012-04-15 MED ORDER — MELOXICAM 15 MG PO TABS: 15.0000 mg | ORAL_TABLET | Freq: Every day | ORAL | Status: DC

## 2012-04-15 MED ORDER — TRAMADOL HCL 50 MG PO TABS: 50.0000 mg | ORAL_TABLET | Freq: Three times a day (TID) | ORAL | Status: AC | PRN

## 2012-04-15 NOTE — Progress Notes (Signed)
  Subjective:    Patient ID: Todd Hayes, male    DOB: 27-Nov-1955, 56 y.o.   MRN: [id number]  HPI  1. Chronic pain: Chronic Pain Follow-Up Assessment Chronic Pain Diagnosis:Back pain (Previous Pain Clinic notes: he had an EMG showing neuropathy. he was Dx with Myofascial pain syndrome. 2. Lumbar spondylosis. 3. Polyneuropathy. Tx Mobic, Vicodin 5/325 three times a day)  Pain scale rating at its BEST in the past 24 hours (1 to 10): 6 Pain scale rating at its WORST in the past 24 hours (1 to 10): 9-10 Adverse Effects:  (None x__ Nausea__ Vomiting__ Confusion__ Sleepiness__ Fatigue__ Constipation__ Other__) Treatment of Adverse Effects: n/a Since the last clinic visit, how much relief have pain treatment and medication provided? Please circle the one percentage that shows how much relief you have received: 0%__ 10%__ 20%__ 30%_x_ 40%__ 50%__ 60%__ 70%__ 80%__ 90%__ 100%__ Loraine Leriche the one number that describes how, during the past 24 hours, pain has interfered with your: General Activity 0__ 1__ 2__ 3__ 4__ 5__ 6__ 7_x_ 8__ 9__ 10__ Does not interfere      Completely interferes Mood 0__ 1__ 2__ 3_x_ 4__ 5__ 6__ 7__ 8__ 9__ 10__ Does not interfere      Completely interferes Ability to work (in or out of the home) 0__ 1__ 2__ 3__ 4__ 5__ 6__ 7__ 8_x_ 9__ 10__ Does not interfere      Completely interferes Interactions with other people 0__ 1__ 2__ 3__ 4__ 5_x_ 6__ 7__ 8__ 9__ 10__ Does not interfere      Completely interferes Sleep 0__ 1__ 2__ 3__ 4__ 5__ 6__ 7__ 8__ 9_x_ 10__ Does not interfere      Completely interferes  Enjoyment of life 0__ 1__ 2__ 3__ 4__ 5__ 6__ 7_x_ 8__ 9__ 10__ Does not interfere      Completely interferes  Has used tramadol in the past in conjunction with his hydrocodone which he says has worked fairly well.   2. Hyperlipidemia: Reports myalgias with simvastatin. Would like to try a different medication.  3.  Bump on ear:   Reports "wart" on right earlobe for  the past couple months. States that this often become scaly and bleeds. Has slowly been increasing in size. Area is nontender.   Review of Systems     Objective:   Physical Exam  Constitutional: He is oriented to person, place, and time. He appears well-developed.       Obese male, nad   Cardiovascular: Normal rate, regular rhythm and normal heart sounds.   Musculoskeletal: He exhibits no edema.       Spasm improved. Remainder of exam remains unchanged from previous appointment.  Neurological: He is alert and oriented to person, place, and time.  Skin:       There is a raised lesion on the right earlobe approximately 1 cm in diameter. Area is scabbed over, nontender.          Assessment & Plan:

## 2012-04-15 NOTE — Patient Instructions (Signed)
Thank you for coming in today, it was good to see you I will see you back in 3 months to follow up I am changing your cholesterol pill to pravastatin I am going to check to see if we can remove that area on your ear at our office.  If not I am going to send in a referral to [facility] to see if you can be seen there to have it removed so that you don't get stuck with a big bill.

## 2012-04-15 NOTE — Assessment & Plan Note (Signed)
Pain is well controlled as previously. We'll continue Vicodin as well as meloxicam. We'll add-on tramadol to see if this helps with better pain control. Followup in 3 months.

## 2012-04-15 NOTE — Assessment & Plan Note (Signed)
Will DC simvastatin and start pravastatin to see if this helps with myalgias. Patient  Unable to afford other high potency statin

## 2012-04-15 NOTE — Assessment & Plan Note (Signed)
Area on  [place] worrisome for basal cell carcinoma versus keratoacanthoma. Explained need for removal. We'll check with preceptor's in our clinic to see if this is something we can do here And Call patient back. If unable to do here since patient is uninsured we'll plan to refer to [facility] dermatology

## 2012-05-07 ENCOUNTER — Telehealth: Payer: Self-pay | Admitting: Family Medicine

## 2012-05-07 NOTE — Telephone Encounter (Signed)
One of his meds is causing leg cramps and he wants to stop taking them... Not sure what he should do.

## 2012-05-07 NOTE — Telephone Encounter (Signed)
Returned call to patient.  Not sure which med is causing leg cramps.  Thinks it may be his cholesterol med (Pravachol).  Was recently changed from simvastatin due to muscle cramps.  Patient also states Vicodin 5/500 and Tramadol are not helping with pain.  Dr. Ashley Royalty had discussed changing to a different pain med at patient's last office visit.  Patient wants to try a different pain med.   Will route note to Dr. Ashley Royalty and call patient back.  Gaylene Brooks, RN

## 2012-05-12 MED ORDER — FENOFIBRATE MICRONIZED 134 MG PO CAPS: 134.0000 mg | ORAL_CAPSULE | Freq: Every day | ORAL | Status: DC

## 2012-05-12 NOTE — Telephone Encounter (Signed)
Returned call to patient.  Informed to d/c pravastatin and pick up new Rx (fenofibrate) from pharmacy.  Also informed patient that Dr. Ashley Royalty does not want to make any changes to his pain medication at this time.  Gaylene Brooks, RN

## 2012-05-12 NOTE — Telephone Encounter (Signed)
Likely pravastatin contributing to this can discontinue for now. Will send in rx for fenofibrate, as he has intolerance to multiple statins.  Discussed adding tramadol at last visit and this was added on to previous 5/500mg  hydrocodone.  I will not change his pain medication or increase his hydrocodone at this time.

## 2012-06-05 ENCOUNTER — Telehealth: Payer: Self-pay | Admitting: Family Medicine

## 2012-06-05 NOTE — Telephone Encounter (Signed)
Patient is calling due to an irritation in his eye.  He said he woke up yesterday and felt like something was in his eye and now it is draining.  He did not want to schedule an appt, and was hoping that Dr. Ashley Royalty would call in something to his pharmacy.

## 2012-06-05 NOTE — Telephone Encounter (Signed)
Pt advised for something like this, pt needs an OV. Pt agreed and will schedule.

## 2012-06-06 ENCOUNTER — Ambulatory Visit: Payer: Self-pay

## 2012-06-11 ENCOUNTER — Other Ambulatory Visit: Payer: Self-pay | Admitting: Family Medicine

## 2012-06-11 MED ORDER — METHOCARBAMOL 750 MG PO TABS: 750.0000 mg | ORAL_TABLET | Freq: Three times a day (TID) | ORAL | Status: DC

## 2012-06-16 ENCOUNTER — Other Ambulatory Visit: Payer: Self-pay | Admitting: *Deleted

## 2012-06-16 MED ORDER — METHOCARBAMOL 750 MG PO TABS: 750.0000 mg | ORAL_TABLET | Freq: Three times a day (TID) | ORAL | Status: AC

## 2012-07-01 ENCOUNTER — Ambulatory Visit: Payer: Self-pay | Admitting: Family Medicine

## 2012-07-07 ENCOUNTER — Ambulatory Visit (INDEPENDENT_AMBULATORY_CARE_PROVIDER_SITE_OTHER): Payer: Self-pay | Admitting: Family Medicine

## 2012-07-07 ENCOUNTER — Encounter: Payer: Self-pay | Admitting: Family Medicine

## 2012-07-07 ENCOUNTER — Ambulatory Visit: Payer: Self-pay | Admitting: Family Medicine

## 2012-07-07 VITALS — BP 139/70 | HR 82 | Ht 72.0 in | Wt 256.0 lb

## 2012-07-07 DIAGNOSIS — H9209 Otalgia, unspecified ear: Secondary | ICD-10-CM

## 2012-07-07 DIAGNOSIS — G8929 Other chronic pain: Secondary | ICD-10-CM

## 2012-07-07 DIAGNOSIS — H9202 Otalgia, left ear: Secondary | ICD-10-CM | POA: Insufficient documentation

## 2012-07-07 DIAGNOSIS — R21 Rash and other nonspecific skin eruption: Secondary | ICD-10-CM

## 2012-07-07 MED ORDER — HYDROCODONE-ACETAMINOPHEN 7.5-325 MG PO TABS: 1.0000 | ORAL_TABLET | Freq: Four times a day (QID) | ORAL | Status: DC | PRN

## 2012-07-07 MED ORDER — CIPROFLOXACIN-DEXAMETHASONE 0.3-0.1 % OT SUSP: 4.0000 [drp] | Freq: Two times a day (BID) | OTIC | Status: AC

## 2012-07-07 MED ORDER — MELOXICAM 15 MG PO TABS: 15.0000 mg | ORAL_TABLET | Freq: Every day | ORAL | Status: DC

## 2012-07-07 MED ORDER — TRAZODONE HCL 100 MG PO TABS: 100.0000 mg | ORAL_TABLET | Freq: Every day | ORAL | Status: DC

## 2012-07-07 NOTE — Assessment & Plan Note (Signed)
Exam consistent with otitis externa, will rx antibiotic drops.  Instructed to keep dry

## 2012-07-07 NOTE — Patient Instructions (Addendum)
Thank you for coming in today, it was good to see you I have sent in a medication for your ear to your pharmacy. I will see you back in 2 weeks to recheck the ear I will see you back in 3-4 months for your pain follow up

## 2012-07-07 NOTE — Assessment & Plan Note (Signed)
Will try to located results of bx again.

## 2012-07-07 NOTE — Assessment & Plan Note (Signed)
Pain not well controlled, even with addition of tramadol.  Will increase hydrocodone to 7.5/325mg , discussed that will not increase further.  Will return in 3 months.

## 2012-07-07 NOTE — Progress Notes (Signed)
  Subjective:    Patient ID: Todd Hayes, male    DOB: May 09, 1956, 56 y.o.   MRN: [id number]  HPI 1. Chronic pain: Chronic Pain Follow-Up Assessment Chronic Pain Diagnosis: Back pain (Previous Pain Clinic notes: he had an EMG showing neuropathy. he was Dx with Myofascial pain syndrome. 2. Lumbar spondylosis. 3. Polyneuropathy. Tx Mobic, Vicodin 5/325 three times a day)   Pain scale rating at its BEST in the past 24 hours (1 to 10): 6-7 Pain scale rating at its WORST in the past 24 hours (1 to 10): 10 Adverse Effects:  (None_x Nausea__ Vomiting__ Confusion__ Sleepiness__ Fatigue__ Constipation__ Other__) Treatment of Adverse Effects: n/a Since the last clinic visit, how much relief have pain treatment and medication provided? Please circle the one percentage that shows how much relief you have received: 0%__ 10%__ 20%__ 30%__ 40%_x_50%__60%__ 70%__ 80%__ 90%__ 100%__ Loraine Leriche the one number that describes how, during the past 24 hours, pain has interfered with your: General Activity 0__ 1__ 2__ 3__ 4__ 5__ 6_x_ 7__ 8__ 9__ 10__ Does not interfere      Completely interferes Mood 0__ 1__ 2__ 3__ 4__ 5__ 6_x_ 7__ 8__ 9__ 10__ Does not interfere      Completely interferes Ability to work (in or out of the home) 0__ 1__ 2__ 3__ 4_x_ 5__ 6__ 7__ 8__ 9__ 10__ Does not interfere      Completely interferes Interactions with other people 0__ 1__ 2__ 3__ 4_x_ 5__ 6__ 7__ 8__ 9__ 10__ Does not interfere      Completely interferes Sleep 0__ 1__ 2__ 3__ 4__ 5__ 6_x_ 7__ 8__ 9__ 10__ Does not interfere      Completely interferes  Enjoyment of life 0__ 1__ 2__ 3__ 4__ 5_x_ 6__ 7__ 8__ 9__ 10__ Does not interfere      Completely interferes  Does not feel combination of tramadol and hydrocodone is working very well.  Would like to try increase in hydrocodone instead.  2.  Ear pain:  Has had ear pain in L ear since last week after returning to the beach.  Feels like "burning" in ear.  Has been using  auralgan drops, which has helped some with pain.  Has not noticed drainage from the ear.  He denies any change in hearing, fever, chills.  3.  Skin lesions:  Still with multiple pruritic lesions on arms, chest, back and legs.  States that these have been biopsied before, however I have not been able to find pathology report, and after calling previously it could not be located at that time.  He denies pain over areas, erythema.   Review of Systems Per HPI    Objective:   Physical Exam  Constitutional:       Obese male, nad.  Walks with stooped gait.  HENT:       L external canal erythematous with some debris in distal canal.  TM normal.  R ear nromal.  Neck: Neck supple.  Musculoskeletal: He exhibits no edema.  Neurological: He is alert.  skin:  Multiple scaly, pigmented macules on arms, legs, chest and back.          Assessment & Plan:

## 2012-07-09 ENCOUNTER — Ambulatory Visit: Payer: Self-pay | Admitting: Family Medicine

## 2012-07-09 ENCOUNTER — Telehealth: Payer: Self-pay | Admitting: Family Medicine

## 2012-07-09 ENCOUNTER — Other Ambulatory Visit: Payer: Self-pay | Admitting: Family Medicine

## 2012-07-09 NOTE — Telephone Encounter (Signed)
Called path at [facility] looked up records back to 2000 and found no pathology reports for pt.

## 2012-07-09 NOTE — Telephone Encounter (Signed)
Discussed with Bonnie [place] and she was able to find report in our paper records.  Biopsy x2, both with lichen simplex chronicus.

## 2012-07-25 ENCOUNTER — Ambulatory Visit: Payer: Self-pay | Admitting: Family Medicine

## 2012-09-15 ENCOUNTER — Telehealth: Payer: Self-pay | Admitting: Family Medicine

## 2012-09-15 MED ORDER — TRAZODONE HCL 100 MG PO TABS: 100.0000 mg | ORAL_TABLET | Freq: Every day | ORAL | Status: DC

## 2012-09-15 NOTE — Telephone Encounter (Signed)
Trazodone was never BID.  Was always qhs. Will leave as is.

## 2012-09-15 NOTE — Telephone Encounter (Signed)
Called pt. He reports that he has been taking 2 Trazedone every night for over one year. It was prescribed by Dr.Wallace. I told the pt, that I would let Dr.Matthews know. Lorenda Hatchet, Renato Battles

## 2012-09-15 NOTE — Telephone Encounter (Signed)
Patient is calling because the Rx for Trazadone was not written correctly so he did not get the correct quantity.  He got 30, but if he needs to take 2 per day, which is what he was told, then he will need 60 tablets.  He needs the correct Rx sent to [facility] [address] in [place].

## 2012-09-15 NOTE — Telephone Encounter (Signed)
Fwd. To Dr.McGill. Please address. Thanks .Arlyss Repress

## 2012-09-15 NOTE — Telephone Encounter (Signed)
D/w Dr. Ashley Royalty.  Pt takes 1-2 qhs.  Will refill with these instructions.

## 2012-09-15 NOTE — Telephone Encounter (Signed)
Pt is asking to speak to nurse - is out of his meds and needs them asap

## 2012-09-15 NOTE — Telephone Encounter (Signed)
Will fwd. To Dr.Matthews to clarify. Lorenda Hatchet, Renato Battles

## 2012-09-16 NOTE — Telephone Encounter (Signed)
Called and informed patient of refill sent to pharmacy. He says he will run out again before his appointment in December because there are no refills. I explained to him that it was filled by another doctor because his PCP was out this week and to call back before he runs out for further refills when Dr Ashley Royalty is back.Busick, Rodena Medin

## 2012-10-08 ENCOUNTER — Other Ambulatory Visit: Payer: Self-pay | Admitting: Family Medicine

## 2012-10-20 ENCOUNTER — Encounter: Payer: Self-pay | Admitting: Family Medicine

## 2012-10-20 ENCOUNTER — Ambulatory Visit (INDEPENDENT_AMBULATORY_CARE_PROVIDER_SITE_OTHER): Payer: No Typology Code available for payment source | Admitting: Family Medicine

## 2012-10-20 VITALS — BP 147/85 | HR 76 | Ht 72.0 in | Wt 250.0 lb

## 2012-10-20 DIAGNOSIS — L538 Other specified erythematous conditions: Secondary | ICD-10-CM

## 2012-10-20 DIAGNOSIS — G8929 Other chronic pain: Secondary | ICD-10-CM

## 2012-10-20 DIAGNOSIS — L304 Erythema intertrigo: Secondary | ICD-10-CM

## 2012-10-20 MED ORDER — CLOTRIMAZOLE 1 % EX CREA: TOPICAL_CREAM | Freq: Two times a day (BID) | CUTANEOUS | Status: DC

## 2012-10-20 MED ORDER — TRAZODONE HCL 100 MG PO TABS: 200.0000 mg | ORAL_TABLET | Freq: Every day | ORAL | Status: DC

## 2012-10-20 MED ORDER — HYDROCODONE-ACETAMINOPHEN 7.5-325 MG PO TABS: 1.0000 | ORAL_TABLET | Freq: Four times a day (QID) | ORAL | Status: DC | PRN

## 2012-10-20 NOTE — Patient Instructions (Addendum)
Thank you for coming in today, it was good to see you As before I will not increase pain medication further.  Apply for the orange card through our clinic Follow up in 3-4 months.

## 2012-10-27 DIAGNOSIS — L304 Erythema intertrigo: Secondary | ICD-10-CM | POA: Insufficient documentation

## 2012-10-27 NOTE — Assessment & Plan Note (Signed)
Pain medications refilled, discussed reapplying for orange card so that we can try to refer back to pain management.

## 2012-10-27 NOTE — Assessment & Plan Note (Signed)
Appears to be candidal intertrigo.  No fluorescence with woods lamp, less likely erythrasma.  Will treat with clotrimazole, follow up if not improving.

## 2012-10-27 NOTE — Progress Notes (Signed)
  Subjective:    Patient ID: Todd Hayes, male    DOB: 05/24/1956, 56 y.o.   MRN: [id number]  HPI 1.  Chronic Pain Follow-Up Assessment Chronic Pain Diagnosis: Back pain (Previous Pain Clinic notes: he had an EMG showing neuropathy. he was Dx with Myofascial pain syndrome. 2. Lumbar spondylosis. 3. Polyneuropathy. Tx Mobic, Vicodin 5/325 three times a day)   Pain scale rating at its BEST in the past 24 hours (1 to 10): 3 Pain scale rating at its WORST in the past 24 hours (1 to 10): 9 Adverse Effects:  (None_x_ Nausea__ Vomiting__ Confusion__ Sleepiness__ Fatigue__ Constipation__ Other__) Treatment of Adverse Effects: n/a Since the last clinic visit, how much relief have pain treatment and medication provided? Please circle the one percentage that shows how much relief you have received: 0%__ 10%__ 20%__ 30%__ 40%__ 50%__ 60%_x_ 70%__ 80%__ 90%__ 100%__ Loraine Leriche the one number that describes how, during the past 24 hours, pain has interfered with your: General Activity 0__ 1__ 2__ 3_x_ 4__ 5__ 6__ 7__ 8__ 9__ 10__ Does not interfere      Completely interferes Mood 0__ 1__ 2__ 3__ 4__ 5_x_ 6__ 7__ 8__ 9__ 10__ Does not interfere      Completely interferes Ability to work (in or out of the home) 0__ 1__ 2__ 3__ 4__ 5__ 6_x_ 7__ 8__ 9__ 10__ Does not interfere      Completely interferes Interactions with other people 0__ 1__ 2__ 3__ 4_x_ 5__ 6__ 7__ 8__ 9__ 10__ Does not interfere      Completely interferes Sleep 0__ 1__ 2__ 3__ 4_x_ 5__ 6__ 7__ 8__ 9__ 10__ Does not interfere      Completely interferes  Enjoyment of life 0__ 1__ 2__ 3__ 4x__ 5__ 6__ 7__ 8__ 9__ 10__ Does not interfere      Completely interferes  Stable on current pain regiment.  States that he does not have orange card yet, but will try to obtain again so that we can get him back to pain management.  2. Rash:  Rash on groin and inner thighs for two weeks.  Areas are itchy.  Has not tried anything for treatment.  No  changes in soaps, lotions, detergents.  No swelling, pain,  or drainage around area.     Review of Systems Per HPI    Objective:   Physical Exam  Constitutional: He appears well-nourished. No distress.  HENT:  Head: Normocephalic and atraumatic.  Musculoskeletal: He exhibits no edema.  Skin:       Hyperpigmented intertriginous rash in groin and upper thighs.  Does not fluoresce with woods lamp.           Assessment & Plan:

## 2013-02-06 ENCOUNTER — Ambulatory Visit (INDEPENDENT_AMBULATORY_CARE_PROVIDER_SITE_OTHER): Payer: No Typology Code available for payment source | Admitting: Family Medicine

## 2013-02-06 ENCOUNTER — Encounter: Payer: Self-pay | Admitting: Family Medicine

## 2013-02-06 VITALS — BP 143/79 | HR 85 | Ht 72.0 in | Wt 238.0 lb

## 2013-02-06 DIAGNOSIS — I10 Essential (primary) hypertension: Secondary | ICD-10-CM

## 2013-02-06 DIAGNOSIS — G8929 Other chronic pain: Secondary | ICD-10-CM

## 2013-02-06 DIAGNOSIS — M25511 Pain in right shoulder: Secondary | ICD-10-CM

## 2013-02-06 DIAGNOSIS — M25519 Pain in unspecified shoulder: Secondary | ICD-10-CM

## 2013-02-06 DIAGNOSIS — E785 Hyperlipidemia, unspecified: Secondary | ICD-10-CM

## 2013-02-06 DIAGNOSIS — M19011 Primary osteoarthritis, right shoulder: Secondary | ICD-10-CM | POA: Insufficient documentation

## 2013-02-06 LAB — COMPREHENSIVE METABOLIC PANEL
Albumin: 4.1 g/dL (ref 3.5–5.2)
BUN: 17 mg/dL (ref 6–23)
CO2: 23 mEq/L (ref 19–32)
Calcium: 9.4 mg/dL (ref 8.4–10.5)
Chloride: 100 mEq/L (ref 96–112)
Glucose, Bld: 107 mg/dL — ABNORMAL HIGH (ref 70–99)
Potassium: 4.8 mEq/L (ref 3.5–5.3)

## 2013-02-06 LAB — LIPID PANEL
HDL: 32 mg/dL — ABNORMAL LOW (ref >39)
Triglycerides: 581 mg/dL — ABNORMAL HIGH (ref <150)

## 2013-02-06 MED ORDER — LISINOPRIL 20 MG PO TABS: 20.0000 mg | ORAL_TABLET | Freq: Every day | ORAL | Status: DC

## 2013-02-06 MED ORDER — TRAZODONE HCL 100 MG PO TABS: 200.0000 mg | ORAL_TABLET | Freq: Every day | ORAL | Status: DC

## 2013-02-06 MED ORDER — HYDROCODONE-ACETAMINOPHEN 7.5-325 MG PO TABS: 1.0000 | ORAL_TABLET | Freq: Four times a day (QID) | ORAL | Status: DC | PRN

## 2013-02-07 ENCOUNTER — Ambulatory Visit (HOSPITAL_COMMUNITY)
Admission: RE | Admit: 2013-02-07 | Discharge: 2013-02-07 | Disposition: A | Discharge status: Home / Self Care | Payer: No Typology Code available for payment source | Source: Ambulatory Visit | Attending: Family Medicine | Admitting: Family Medicine

## 2013-02-07 DIAGNOSIS — M25511 Pain in right shoulder: Secondary | ICD-10-CM

## 2013-02-07 DIAGNOSIS — M19019 Primary osteoarthritis, unspecified shoulder: Secondary | ICD-10-CM | POA: Insufficient documentation

## 2013-02-07 DIAGNOSIS — M25519 Pain in unspecified shoulder: Secondary | ICD-10-CM | POA: Insufficient documentation

## 2013-02-09 NOTE — Assessment & Plan Note (Addendum)
Fairly well controlled. No changes to medications.  Check labwork today.

## 2013-02-09 NOTE — Assessment & Plan Note (Signed)
Suspect AC arthritis given exam findings.  Will get xray of shoulder to evaluated.  COntinue current pain meds, consider injection of AC joint if xray indicates degenerative changes.

## 2013-02-09 NOTE — Assessment & Plan Note (Signed)
TG quite elevated.  Has not been able to tolerate any medications he has tried.  Does not want to try anything else at this time.  Discussed with him consquences of uncontrolled TG including pancreatitis and increased LDL leading to heart disease.  WIll follow up with him at next appointment.  Check lipids again today.

## 2013-02-09 NOTE — Progress Notes (Signed)
  Subjective:    Patient ID: Todd Hayes, male    DOB: December 30, 1955, 57 y.o.   MRN: [id number]  HPI  1. CHRONIC HYPERTENSION  Disease Monitoring  Blood pressure range: No self monitoring at home  Chest pain: no   Dyspnea: no   Claudication: no   Medication compliance: yes  Medication Side Effects  Lightheadedness: no   Urinary frequency: no   Edema: no   Impotence: yes   Preventitive Healthcare:  Exercise: no   Diet Pattern: None  Salt Restriction: None  2. Pain:  Chronic pain diagnosis from previous pain clinic indicating neuropathy with Myofascial pain syndrome. 2. Lumbar spondylosis. 3. Polyneuropathy. Tx currently  Mobic, Vicodin 7.5/325. Reports pain is adequately controlled at this time with current regimen.  He does report R shoulder pain today that has been present for the past few weeks.  Pain is worse with reaching across his body.  No problems with overhead movements.  Denies radiation from neck.      3.  HLD:  Lipids previously showing significant elevations in TG.  Was prescribed fenofibrate however has not been using because he reports it makes him sick on his stomach. He is not interested in making lifestyle changes or starting a new medication at this time.   Smoking history reviewed, continues to smoke daily.  Not interested in qutting.   Review of Systems     Objective:   Physical Exam  Constitutional:  Obese male, nad   HENT:  Head: Atraumatic.  Eyes: No scleral icterus.  Neck: Neck supple.  Cardiovascular: Normal rate and regular rhythm.   Pulmonary/Chest: Effort normal and breath sounds normal.  Musculoskeletal:  R shoulder with TTP over AC joint.  ROM normal except for reaching  across body.  Strength is 5/5. Negative impingement testing.           Assessment & Plan:

## 2013-02-09 NOTE — Assessment & Plan Note (Signed)
Pain adequately controlled, continue current pain regiment.  Reports that he may apply for orange card.  Encouraged so that I can refer back to pain clinic.

## 2013-02-18 ENCOUNTER — Telehealth: Payer: Self-pay | Admitting: Family Medicine

## 2013-02-18 NOTE — Telephone Encounter (Signed)
Patient is calling for the xray results for 3/29.

## 2013-02-18 NOTE — Telephone Encounter (Signed)
Forward to PCP to review results

## 2013-02-19 NOTE — Telephone Encounter (Signed)
Xray showed some arthritis in joint where collar bone and shoulder joint come together.  If he is still having pain he can come in and we can try an injection.

## 2013-02-24 NOTE — Telephone Encounter (Signed)
Called patient and he is still having a lot of pain in his shoulder. I have scheduled him for an office visit with Dr Ashley Royalty for possible shoulder injection.Eyleen Rawlinson, Rodena Medin

## 2013-03-10 ENCOUNTER — Encounter: Payer: Self-pay | Admitting: Family Medicine

## 2013-03-10 ENCOUNTER — Ambulatory Visit (INDEPENDENT_AMBULATORY_CARE_PROVIDER_SITE_OTHER): Payer: No Typology Code available for payment source | Admitting: Family Medicine

## 2013-03-10 VITALS — BP 131/71 | HR 73 | Ht 72.0 in | Wt 235.0 lb

## 2013-03-10 DIAGNOSIS — M25519 Pain in unspecified shoulder: Secondary | ICD-10-CM

## 2013-03-10 DIAGNOSIS — M25511 Pain in right shoulder: Secondary | ICD-10-CM

## 2013-03-10 MED ORDER — METHYLPREDNISOLONE ACETATE 40 MG/ML IJ SUSP
20.0000 mg | Freq: Once | INTRAMUSCULAR | Status: AC
  Administered 2013-03-10: 20 mg via INTRA_ARTICULAR

## 2013-03-10 NOTE — Progress Notes (Signed)
  Subjective:    Patient ID: Todd Hayes, male    DOB: [birth year], 57 y.o.   MRN: [id number]  HPI  1. Shoulder pain:  Here for follow up of R shoulder pain.  Pain continues, especially when reaching across his body or above his head.  Negative impingement signs previously and Xray shows degenerative changes or AC joint with possible spur.  He wishes to have injection to see if this may be helpful.   Denies swelling, warmth or joint or fever.   Review of Systems Per HPI    Objective:   Physical Exam  Constitutional:  Obese male, nad   Musculoskeletal:  R shoulder ROM limited due to pain.  Positive scarf test on R.  Impingement testing negative.  Strength 5/5.   INJECTION: Patient was given informed consent, signed copy in the chart. Appropriate time out was taken. Area prepped and draped in usual sterile fashion. 0.5 cc of methylprednisolone 40 mg/ml plus  0.5 cc of 1% lidocaine without epinephrine was injected into the [facility] joint using a(n) superior approach. The patient tolerated the procedure well. There were no complications. Post procedure instructions were given.           Assessment & Plan:

## 2013-03-10 NOTE — Assessment & Plan Note (Signed)
R shoulder AC joint injected today.  Given verbal post procedure instructions.  If not improving in a couple weeks follow up.  Would plan to refer to [facility] for injection under [place] if not improved.

## 2013-04-14 ENCOUNTER — Other Ambulatory Visit: Payer: Self-pay | Admitting: *Deleted

## 2013-04-14 NOTE — Telephone Encounter (Signed)
Requested Prescriptions   Pending Prescriptions Disp Refills  . pantoprazole (PROTONIX) 40 MG tablet      Sig: Take 1 tablet (40 mg total) by mouth daily.   Wyatt Haste, RN-BSN

## 2013-04-15 MED ORDER — PANTOPRAZOLE SODIUM 40 MG PO TBEC: 40.0000 mg | DELAYED_RELEASE_TABLET | Freq: Every day | ORAL | Status: DC

## 2013-04-29 ENCOUNTER — Other Ambulatory Visit: Payer: Self-pay | Admitting: Family Medicine

## 2013-05-27 ENCOUNTER — Encounter: Payer: Self-pay | Admitting: Family Medicine

## 2013-05-27 ENCOUNTER — Ambulatory Visit (INDEPENDENT_AMBULATORY_CARE_PROVIDER_SITE_OTHER): Payer: Self-pay | Admitting: Family Medicine

## 2013-05-27 VITALS — BP 145/84 | HR 68 | Ht 72.0 in | Wt 224.0 lb

## 2013-05-27 DIAGNOSIS — Z1211 Encounter for screening for malignant neoplasm of colon: Secondary | ICD-10-CM | POA: Insufficient documentation

## 2013-05-27 DIAGNOSIS — Z79891 Long term (current) use of opiate analgesic: Secondary | ICD-10-CM

## 2013-05-27 DIAGNOSIS — M545 Low back pain: Secondary | ICD-10-CM

## 2013-05-27 DIAGNOSIS — G8929 Other chronic pain: Secondary | ICD-10-CM

## 2013-05-27 DIAGNOSIS — Z79899 Other long term (current) drug therapy: Secondary | ICD-10-CM

## 2013-05-27 MED ORDER — HYDROCODONE-ACETAMINOPHEN 7.5-325 MG PO TABS: 1.0000 | ORAL_TABLET | Freq: Four times a day (QID) | ORAL | Status: DC | PRN

## 2013-05-27 NOTE — Patient Instructions (Signed)
Todd Hayes,   It was nice to meet you. I have given your a month's worth of pain medication. If your urine does not have any concerning findings, then I will provide 3 refills. I will call and let you know when you can pick that up. Please read below about pain medications.   You also need other health maintenance follow up like a colonoscopy. Please make an appointment with Dr. Althea Charon to address these issues.   Sincerely,   Dr. Clinton Sawyer  Narcotic Guidelines:   1. You cannot get an early refill, even it is lost.   2. You cannot get pain medications from any other doctor, unless it is the emergency department and related to a new problem or injury.  3. You cannot use alcohol, marijuana, cocaine or any other recreational drugs while using this medication. This is very dangerous.  4. You are willing to have your urine drug tested at each visit.  5. You will not drive while using this medication, because that can endanger you and your family.  6. If any medication is stolen, then there must be a police report to verify it, or it cannot be refilled.  7. I will not prescribe these medications for longer than 3 months.  8. You must bring your pill bottle to each visit.  9. You must use the same pharmacy for all refills for the medication, unless you clear it with me beforehand. 10. You cannot share or sell this medication.  11. Always take a stool softener to prevent constipation.

## 2013-05-27 NOTE — Assessment & Plan Note (Signed)
Informed patient of need for colon cancer screening. He refused.

## 2013-05-27 NOTE — Progress Notes (Signed)
  Subjective:    Patient ID: Todd Hayes, male    DOB: 1956/04/12, 57 y.o.   MRN: [id number]  HPI  57 year old male follow up for chronic back pain. He is treated with hydrocodone/ acetaminophen 7.5/325 mg, with last dose filled June 17th and is currently out and had last dose last night   Back Pain: Location: lower back, "all the way across" Duration: > 20 years Preceding Events: no falls. Years of trauma from working in [employer] and carrying large machinery and products  Quality: 6/10; If you take medication, it goes to 3-4/10 Current Functional Status:  ADL's with difficulty  Alleviating Factors: walking, medication - Vicodin 7.5/325 mg daily 4 times daily, Mobic 15 mg daily   Hx of intervention: no surgery Hx of imaging:  MRI lumbar spine 2010:  1. Small right foraminal protrusion at L3-4 causes right foraminal narrowing with disc contacting the exiting right L3 root within the  foramen. 2. Mild lateral recess narrowing bilaterally at L4-5. 3. Tiny central protrusion L1-2 without central canal or foraminal stenosis. Red Flags: no weakness, no numbness and tingling, no impaired bowel or bladder function     Review of Systems See HPI    Objective:   Physical Exam  BP 145/84  Pulse 68  Ht 6' (1.829 m)  Wt 224 lb (101.606 kg)  BMI 30.37 kg/m2  Gen: obese WM, appears older than stated age  MSK: normal appearing lumbar spine, mild TTP along posterior superior iliac spine,  Neuro:negative straight leg raise bilaterally, 5/5 strength of LE bilaterally, 1+ patellar reflex bilaterally      Assessment & Plan:

## 2013-05-27 NOTE — Assessment & Plan Note (Signed)
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 Althea Charon

## 2013-05-28 LAB — DRUG SCR UR, PAIN MGMT, REFLEX CONF
Benzodiazepines.: NEGATIVE
Marijuana Metabolite: NEGATIVE
Methadone: NEGATIVE
Propoxyphene: NEGATIVE

## 2013-06-02 LAB — OPIATES/OPIOIDS (LC/MS-MS)
Heroin (6-AM), UR: NEGATIVE ng/mL
Hydrocodone: NEGATIVE ng/mL
Oxycodone, ur: 5008 ng/mL
Oxymorphone: 1560 ng/mL

## 2013-06-02 LAB — AMPHETAMINES (GC/LC/MS), URINE: MDA GC/MS confirm: NEGATIVE ng/mL

## 2013-06-04 ENCOUNTER — Telehealth: Payer: Self-pay | Admitting: Family Medicine

## 2013-06-04 DIAGNOSIS — G8929 Other chronic pain: Secondary | ICD-10-CM

## 2013-06-04 MED ORDER — HYDROCODONE-ACETAMINOPHEN 7.5-325 MG PO TABS: 1.0000 | ORAL_TABLET | Freq: Four times a day (QID) | ORAL | Status: AC | PRN

## 2013-06-04 MED ORDER — HYDROCODONE-ACETAMINOPHEN 7.5-325 MG PO TABS: 1.0000 | ORAL_TABLET | Freq: Four times a day (QID) | ORAL | Status: DC | PRN

## 2013-06-04 NOTE — Telephone Encounter (Signed)
Pt called and notified, see message below.  Tahlor Berenguer, Darlyne Russian, CMA

## 2013-06-04 NOTE — Telephone Encounter (Signed)
Please call the patient and let him know that his drug screen was normal and that he can pick up 2 refills for his pain medication. However, he needs to see his regular doctor before any more refills can be provided.

## 2013-06-23 ENCOUNTER — Ambulatory Visit (INDEPENDENT_AMBULATORY_CARE_PROVIDER_SITE_OTHER): Payer: Self-pay | Admitting: Family Medicine

## 2013-06-23 ENCOUNTER — Encounter: Payer: Self-pay | Admitting: Family Medicine

## 2013-06-23 VITALS — BP 119/82 | HR 78 | Temp 98.4°F | Ht 72.0 in | Wt 220.0 lb

## 2013-06-23 DIAGNOSIS — I10 Essential (primary) hypertension: Secondary | ICD-10-CM

## 2013-06-23 DIAGNOSIS — G8929 Other chronic pain: Secondary | ICD-10-CM

## 2013-06-23 DIAGNOSIS — K219 Gastro-esophageal reflux disease without esophagitis: Secondary | ICD-10-CM

## 2013-06-23 MED ORDER — PANTOPRAZOLE SODIUM 20 MG PO TBEC: 20.0000 mg | DELAYED_RELEASE_TABLET | Freq: Every day | ORAL | Status: DC

## 2013-06-23 MED ORDER — GABAPENTIN 100 MG PO CAPS: 100.0000 mg | ORAL_CAPSULE | Freq: Three times a day (TID) | ORAL | Status: DC

## 2013-06-23 NOTE — Patient Instructions (Addendum)
Dear Todd Hayes, Thank you for coming in to clinic today. It was good to meet you!  Today we discussed your pain management. 1. You are on an appropriate dose of your current pain medication. But, I would like to add a different medicine that I think will help your pain. When you run out of your current prescriptions for Norco, please come back to see me and I can re-fill them. If your urine drug screen is appropriate. 2. Let us know if you are interested in a referral back to the pain clinic, for alternative therapy. 3. Continue using the heating pad, stretching, and home exercises, as these will help your muscle spasms.  We started a new medication today to help your back pain. Gabapentin (Neurontin) 100 mg. This medication has the following directions: 1. Start taking 1 capsule (100mg ) at night for the next 3 to 5 days. 2. If you tolerate it, then start taking 1 in the morning in addition to the 1 at night. (Total 200mg  daily) 3. If you tolerate that, then start taking 1 in the morning, 1 in the afternoon, and 1 at night. (Total 300mg  daily) 4. If you feel like it is helping, and could use a higher dose, you may take up to 1 or 2 additional capsules at night. This medication can make you feel sleepy, so be cautious when taking it during the day. It is important to start taking it at night only to determine how your body reacts. We can always re-adjust the dose at a later time. Please call with any questions!  Please schedule a follow-up appointment with me in 3 to 6 months or sooner if you need.  If you have any other questions or concerns, please feel free to call the clinic to contact me. You may also schedule an earlier appointment if necessary.  However, if your symptoms get significantly worse, please go to the Emergency Department to seek immediate medical attention.  Saralyn Pilar, DO [facility]

## 2013-06-24 LAB — DRUG SCR UR, PAIN MGMT, REFLEX CONF
Barbiturate Quant, Ur: NEGATIVE
Cocaine Metabolites: NEGATIVE

## 2013-06-25 DIAGNOSIS — K219 Gastro-esophageal reflux disease without esophagitis: Secondary | ICD-10-CM | POA: Insufficient documentation

## 2013-06-25 NOTE — Assessment & Plan Note (Addendum)
Stable. Today 119/82, HR 78. Compliant with Lisinopril 20mg  daily. No side effects or concerns.

## 2013-06-25 NOTE — Progress Notes (Signed)
Subjective:     Patient ID: JARQUIS WALKER, male   DOB: 01/26/1956, 57 y.o.   MRN: [id number]  HPI  BACK PAIN  Location: Lower back (L4-L5-sacral, left to right, "band-like")  First started: chronic Preceding Events: hx chronic injuries while doing physical work Onset: not acute Trauma: none acutely Quality: ache   Radiation: across back (L to R), some tingling and sharp pains down legs to knees Worse with: wake up in AM, excessive activity Better with: pain medicine Best sitting/standing/leaning forward: unsure, staying in one position too long worsens pain  Hx of Intervention: attempted PT, did not feel was helping. Hx Pain Clinic (no longer attends), Hx of Injections w/o much success Hx of Imaging: Xray (L-spine 2010), MRI L-spine (2010)  Red Flags Fecal/urinary incontinence: no  Numbness/Weakness: no  Fever/chills/sweats: no  Night pain: no  Unexplained weight loss: no  h/o cancer/immunosuppression: no  IV drug use: no  PMH of osteoporosis or chronic steroid use: no   Patient also reports that he does not like taking Tylenol due to getting "shakes" where his arms spasm (R > L), he is unsure if this happens right after or how long after taking Tylenol.   CHRONIC HYPERTENSION  Disease Monitoring  Blood pressure range: controlled 110-140s / 70-80s  Chest pain: no   Dyspnea: no   Claudication: no   Medication compliance: yes  Medication Side Effects  Lightheadedness: no   Urinary frequency: no   Edema: no   Impotence: no   Preventitive Healthcare:  Exercise: yes  GERD: Patient reports taking Protonix 40mg  daily, and requests a dosage change to 20mg  daily due to occasionally feeling "dizzy" with this medication, and does not feel like he needs 40mg  daily.   Social Hx: Smoker 1ppd  Review of Systems  See above HPI Denies any CP, SoB, HA, weakness, visual disturbances, nausea / vomiting / diarrhea / constipation, decreased urine output, blood in stool, edema,  joint deformities, or recent trauma / injury.     Objective:   Physical Exam  BP 119/82  Pulse 78  Temp(Src) 98.4 F (36.9 C) (Oral)  Ht 6' (1.829 m)  Wt 220 lb (99.791 kg)  BMI 29.83 kg/m2  Gen - NAD HEENT - PERRLA, MMM, pharynx clear Neck - supple, non-tender, w/o appreciable muscle spasm Heart - RRR, no murmurs Lungs - scattered rhonchi in bilateral lower lobes, No wheezing or crackles. Good resp effort Ext - warm, no edema, +2 pulses MSK - spine w/o deformities, non-tender to palpation over SP, minimal tenderness to palpation, across L4-L5 paraspinal muscles, negative Straight Leg Raise, negative Seated Slump, +mild R-Lspine facet loading Neuro - grossly non-focal, CN-II-XII intact, muscle str 5/5 bilateral ext, distal sensation to light touch intact Psych - alert, cooperative

## 2013-06-25 NOTE — Assessment & Plan Note (Signed)
Patient reports chronic pain issues for many years, dating back to injuries he suffered from physical work years ago. Primary concern is lower back (also mentions toes, ankles, neck). LBP stretches from Right to Left across L4-L5-sacral region, "band-like", flares feel like aching pain ranging in severity, usually lasts for to 1 hr. Occasional neurological sensations of sharp pain or tingling that travel down either leg stopping at his knees. Also reports hx of muscle spasms related to back and neck pain. Treatment: Hx of Pain Clinic, no longer wishes to return (due to inadequate treatment). Hx of injection - w/o relief. Still uses heating pads, but complains of getting too hot. Failed Medications: Tramadol (does not help pain), Acetaminophen gives him "jerks", spasms in his arms (R > L).  Current Meds: Mobic 15mg  daily, Norco 7.5-325 take 1 tab q 6 PRN pain (continues to take with some relief, despite insistence that he dislikes the tylenol, and would prefer a stronger medication, or higher dose). Imaging: Xray 2010 - small Lumbar osteophytes. MRI 2010 - L4-L5 mild lateral narrowing disc space Exam: suggestive of osteoarthritis of L-spine, without overt radicular symptoms, SLR (negative), Slump (negative), Facet loading (mild +R Lumbar)  Plan: 1. Did not increase his dose of Norco from 7.5 to 10, he currently has 2 Rx for 7.5s (one to be filled 06/26/13, and he asked if I could write a new one for today). I told him that since this was our first time meeting, I did not feel comfortable immediately increasing his dose of his narcotic. 2. Started new Rx for Gabapentin 100mg  TID (titrate up over course of 1-2 weeks, provided instructions) - indicated to help his neurological pain 2. Advised that I could refer him to Pain Clinic if interested - he declined 3. Ordered UDS - pending results 4. He previously signed pain contract with [facility] (Dr. Clinton Sawyer had him sign one at last visit)

## 2013-06-25 NOTE — Assessment & Plan Note (Addendum)
Currently taking Protonix 40mg  daily. Patient requests lowering his dose, as he feels he does not need the higher dose. Occasionally feels dizzy with this med. Will try him on 20mg  daily, and reassess at next visit.

## 2013-06-26 LAB — AMPHETAMINES (GC/LC/MS), URINE: MDMA GC/MS Conf: NEGATIVE ng/mL

## 2013-06-26 LAB — OPIATES/OPIOIDS (LC/MS-MS)
Hydromorphone: NEGATIVE ng/mL
Morphine Urine: NEGATIVE ng/mL
Noroxycodone, Ur: >2500 ng/mL
Oxycodone, ur: 6777 ng/mL

## 2013-07-07 ENCOUNTER — Ambulatory Visit (INDEPENDENT_AMBULATORY_CARE_PROVIDER_SITE_OTHER): Payer: Self-pay | Admitting: Emergency Medicine

## 2013-07-07 ENCOUNTER — Ambulatory Visit: Payer: Self-pay

## 2013-07-07 ENCOUNTER — Encounter: Payer: Self-pay | Admitting: Emergency Medicine

## 2013-07-07 VITALS — BP 130/84 | HR 96 | Temp 99.7°F | Ht 72.0 in | Wt 219.0 lb

## 2013-07-07 DIAGNOSIS — S43499A Other sprain of unspecified shoulder joint, initial encounter: Secondary | ICD-10-CM

## 2013-07-07 DIAGNOSIS — S46811A Strain of other muscles, fascia and tendons at shoulder and upper arm level, right arm, initial encounter: Secondary | ICD-10-CM | POA: Insufficient documentation

## 2013-07-07 NOTE — Patient Instructions (Signed)
You received a trigger point injection today.  Please alternate heat and cold on the shoulder today.  Gradually increase activity.  If not improving in the next 1-2 weeks, please return to clinic.

## 2013-07-07 NOTE — Assessment & Plan Note (Addendum)
Trigger point found today and injected. Discussed alternating heat and ice. Gradually increase range of motion and activity. Declined robaxin stating it would "likely cause jerks" Follow up in 1-2 weeks if not improving.  Could consider shoulder injection if his supraspinatus testing is persistent or worse.

## 2013-07-07 NOTE — Progress Notes (Signed)
  Subjective:    Patient ID: Todd Hayes, male    DOB: 1955/12/28, 57 y.o.   MRN: [id number]  HPI Todd Hayes is here for right shoulder and neck pain.  He states that 10 days ago he started having pain in one spot on his shoulder.  It started to ease up at one time, but came back and spread up his neck and shoulder a little.  No radiation down the arm.   No numbness, tingling or weakness in the right arm/hand.  He states that he has been picking up his dogs (40-50lbs) regularly for the last 2 weeks.  Pain is worse with shrugging his shoulder and turning the head to the right.  His norco does not help the pain.  He has not been taking the gabapentin as it "gives me the jerks."    I have reviewed and updated the following as appropriate: allergies and current medications SHx: current smoker   Review of Systems See HPI    Objective:   Physical Exam BP 130/84  Pulse 96  Temp(Src) 99.7 F (37.6 C) (Oral)  Ht 6' (1.829 m)  Wt 219 lb (99.338 kg)  BMI 29.7 kg/m2 Gen: alert, cooperative, mild distress with exam Neck: full range of motion, but pain with looking to the right Right shoulder: full range of motion; tender over medial trapezius with trigger point; unable to test right trapezius strength secondary to pain; empty can is mildly positive on right; grip strength 5/5, sensation grossly intact to light touch     Assessment & Plan:  Trigger point injection Consent was obtained.  The trigger point was identified and confirmed by the patient.  The spot was marked.  Skin was cleaned with alcohol and betadine.  5cc of 2% lidocaine with epi was injected in a wheel fashion in the area.  Patient tolerated procedure well.

## 2013-07-29 ENCOUNTER — Other Ambulatory Visit: Payer: Self-pay | Admitting: Family Medicine

## 2013-08-24 ENCOUNTER — Encounter: Payer: Self-pay | Admitting: Family Medicine

## 2013-08-24 ENCOUNTER — Telehealth: Payer: Self-pay | Admitting: Family Medicine

## 2013-08-24 ENCOUNTER — Ambulatory Visit (INDEPENDENT_AMBULATORY_CARE_PROVIDER_SITE_OTHER): Payer: Medicare Other | Admitting: Family Medicine

## 2013-08-24 VITALS — BP 146/82 | HR 96 | Temp 98.2°F | Ht 72.0 in | Wt 216.0 lb

## 2013-08-24 DIAGNOSIS — G253 Myoclonus: Secondary | ICD-10-CM | POA: Insufficient documentation

## 2013-08-24 DIAGNOSIS — I10 Essential (primary) hypertension: Secondary | ICD-10-CM

## 2013-08-24 DIAGNOSIS — G8929 Other chronic pain: Secondary | ICD-10-CM

## 2013-08-24 DIAGNOSIS — M545 Low back pain: Secondary | ICD-10-CM

## 2013-08-24 MED ORDER — HYDROCODONE-ACETAMINOPHEN 7.5-325 MG PO TABS: 1.0000 | ORAL_TABLET | ORAL | Status: DC | PRN

## 2013-08-24 MED ORDER — ROPINIROLE HCL 0.25 MG PO TABS: 1.0000 mg | ORAL_TABLET | Freq: Every evening | ORAL | Status: DC | PRN

## 2013-08-24 NOTE — Assessment & Plan Note (Addendum)
Chronic hx gradual progressively worsening "muscle jerks" at night consistent with myoclonic jerks vs restless leg syndrome Pt attributes worsening due to meds - Acetaminophen (in Norco), hx muscle relaxants, Gabapentin, Trazodone? Possibility of alcohol dependence related, due to long 30+ yr hx EtOH abuse, currently drinks 4 beers per night (8-11pm)  Plan: 1. Start Ropinirole 0.25mg  tabs at bedtime (titrate up to 1mg  nightly in 2 weeks) to initiate treatment for restless leg syndrome, recommended get filled at [facility] due to cost (#60 for $15) 2. If no improvement in 1 month, consider rx Hydrocodone instead of Norco, to see if improve w/o Acetaminophen  Future Considerations: 1. Consider taper off Trazodone (question if this worsens jerks?), switch to Elavil or SNRI for chronic pain + insomnia 2. If possibly related to EtOH dependence, could try rx diazepam nightly to see if resolve symptoms. However, would be very cautious with this approach due to abuse potential of BDZ

## 2013-08-24 NOTE — Patient Instructions (Addendum)
Dear Todd Hayes, Thank you for coming in to clinic today. It was good to see you!  Today we discussed your Muscle Jerks. 1. I believe that your muscle jerks at night may be due to Restless Leg Syndrome, and would like to start you on a new medication to treat this. See below for instructions. 2. Continue taking your pain medications as prescribed. We can re-visit your Vicodin use at our next visit, to see how your pain is controlled. 3. If your chills and shakes during the day continue to get worse, please call our clinic and schedule an appointment, as you may have developed an infection. However, it sounds like this may be attributed to the changes in weather and your allergies.  We started a new medication today to help your Jerks at night. Ropinirole 0.25 mg tablets. Please start with 1 tablet at bedtime for the next 2 nights. Then, take 2 tablets (total 0.50mg ) for the next 5 nights. Next week (starting Monday 10/20), please take 4 tablets (total 1mg ) at night for 1 week. Until you come back to the clinic.  Please schedule a follow-up appointment with 2 weeks to 1 month. Or sooner if your muscle jerks, pain, or symptoms get worse.  If you have any other questions or concerns, please feel free to call the clinic to contact me. You may also schedule an earlier appointment if necessary.  However, if your symptoms get significantly worse, please go to the Emergency Department to seek immediate medical attention.  Saralyn Pilar, DO [facility]   Restless Legs Syndrome Restless legs syndrome is a movement disorder. It may also be called a sensori-motor disorder.  CAUSES  No one knows what specifically causes restless legs syndrome, but it tends to run in families. It is also more common in people with low iron, in pregnancy, in people who need dialysis, and those with nerve damage (neuropathy).Some medications may make restless legs syndrome worse.Those  medications include drugs to treat high blood pressure, some heart conditions, nausea, colds, allergies, and depression. SYMPTOMS Symptoms include uncomfortable sensations in the legs. These leg sensations are worse during periods of inactivity or rest. They are also worse while sitting or lying down. Individuals that have the disorder describe sensations in the legs that feel like:  Pulling.  Drawing.  Crawling.  Worming.  Boring.  Tingling.  Pins and needles.  Prickling.  Pain. The sensations are usually accompanied by an overwhelming urge to move the legs. Sudden muscle jerks may also occur. Movement provides temporary relief from the discomfort. In rare cases, the arms may also be affected. Symptoms may interfere with going to sleep (sleep onset insomnia). Restless legs syndrome may also be related to periodic limb movement disorder (PLMD). PLMD is another more common motor disorder. It also causes interrupted sleep. The symptoms from PLMD usually occur most often when you are awake. TREATMENT  Treatment for restless legs syndrome is symptomatic. This means that the symptoms are treated.   Massage and cold compresses may provide temporary relief.  Walk, stretch, or take a cold or hot bath.  Get regular exercise and a good night's sleep.  Avoid caffeine, alcohol, nicotine, and medications that can make it worse.  Do activities that provide mental stimulation like discussions, needlework, and video games. These may be helpful if you are not able to walk or stretch. Some medications are effective in relieving the symptoms. However, many of these medications have side effects. Ask your caregiver about medications  that may help your symptoms. Correcting iron deficiency may improve symptoms for some patients. Document Released: 10/19/2002 Document Revised: 01/21/2012 Document Reviewed: 01/25/2011 [facility] Patient Information 2014 [place], [place].

## 2013-08-24 NOTE — Assessment & Plan Note (Signed)
Stable baseline chronic LBP, improved neck/shoulder pain. Current Meds - Norco 7.5/325mg  q 6 hr PRN, Mobic 15mg  daily Failed Meds - Gabapentin (last visit, 1 mo ago), previously muscle relaxants - reports these meds give him worse "jerks" UDS (06/2013) consistent with appropriate Norco use.  Plan: 1. Increased Norco rx from q 6 hr to q 4 hr (max 6 tabs daily), to match amt patient is taking to control his pain.  Future Consideration: 1. If persistent myoclonic jerks, consider switching to only Hydrocodone (without Tylenol) to see if improved 2. Possible Elavil or Cymbalta for chronic pain? Consider tapering off Trazodone (reports ineffective) 3. Consider future referral to pain clinic

## 2013-08-24 NOTE — Telephone Encounter (Signed)
Spoke with patient and pharmacy and the issue/question has been answered regarding patient.  Todd Hayes, Darlyne Russian, CMA

## 2013-08-24 NOTE — Telephone Encounter (Signed)
Pt called because [facility] said that one of the number on his hydrocodone is wrong and they need someone form this office to call them. [facility] [address] [place]. jw

## 2013-08-24 NOTE — Assessment & Plan Note (Signed)
Well-controlled HTN, BP today 146/82.  Meds - Lisinopril 20mg  daily No complications   Plan:  1. Continue current BP meds.  2. Plan to re-check CMET, trend Cr in 6 months (last Cr 1.21, 01/2013) 3. Lifestyle Mods - Not interested in smoking cessation

## 2013-08-24 NOTE — Progress Notes (Signed)
Subjective:     Patient ID: Todd Hayes, male   DOB: 12-21-1955, 57 y.o.   MRN: [id number]  HPI  MUSCLE JERKS: Reports chronic hx of gradual progressively worsening "muscle jerks" in both legs and feet (occasionally hands). He attributes these jerks to Tylenol (in his Norco) and it is made worse with other medications in past (muscle relaxants, Gabapentin). Jerks occur only at night (denies any during day), cause difficulty in falling asleep, some improvement getting up to walk around, may take 1 additional Norco or Trazodone. Previously, jerks lasted about at night, within past few months occasionally can last up to 2 hrs Worse with - Tylenol (in Norco), historically (muscle relaxants), failed recent trial of Gabapentin.  CHRONIC PAIN: Denies any worsening of pain, but reports requiring increased amounts of Norco 7.5/325 (taking q 4 hours, up to 5-6x daily) for LBP, neck pain. He states that he has not changed the amount he takes, but explains that this is why he runs out of Rx early. Today, reports baseline pain 7/10 Low Back. Current meds - Norco 7.5/325 q 6 hr PRN, Mobic 15mg  daily Failed - Gabapentin (tried at last apt, 1 mo ago) Other TRX: Last visit (07/07/13) had Trigger Pt injection (R-trapezius), some noted improvement.  CHRONIC HTN: Normotensive BP today, 146/82 (mild elevation from last, 130/84) Reports no concerns Current Meds - Lisinopril 20mg  daily   Reports good compliance, took meds today. Tolerating well, w/o complaints. Denies CP, dyspnea, claudication, HA, edema, dizziness / lightheadedness  Family Hx: +identical "muscle jerks" (brother, also on significant pain meds Percocet, Oxycontin) Social Hx: Continued smoker 1ppd, Alcohol 4 beers nightly (strict schedule 8pm - 11pm). Immunizations - Declined influenza vaccine.  Review of Systems  See above HPI. Additionally, admits +night sweats, chills/shaking - for 4 days (improving). Admits +numbness / tingling in  b/l toes. Denies abd pain, n/v/c/d     Objective:   Physical Exam  BP 146/82  Pulse 96  Temp(Src) 98.2 F (36.8 C) (Oral)  Ht 6' (1.829 m)  Wt 216 lb (97.977 kg)  BMI 29.29 kg/m2  Gen - alert, conversational, NAD HEENT - PERRLA, MMM, pharynx clear  Neck - supple, non-tender, no LAD Heart - RRR, no murmurs  Lungs - Mostly CTAB, +scattered rhonchi in bilateral lung bases. No wheezing or crackles. Nml wob Ext - no edema, non-tender, +2 peripheral pulses MSK - Back: without gross defects or postural deformity, spine non-tender to palpation. +paraspinal muscle hypertonicity b/l L3-L5 Neuro - grossly non-focal, CN-II-XII intact, intact +2 DTR bilateral (brachiorad, biceps, patellar, achilles), muscle str 5/5 bilateral ext. +Decreased sensation to light touch b/l toes     Assessment:     See specific A&P problem list for details.     Plan:     See specific A&P problem list for details.

## 2013-09-22 ENCOUNTER — Ambulatory Visit (INDEPENDENT_AMBULATORY_CARE_PROVIDER_SITE_OTHER): Payer: Medicare Other | Admitting: Family Medicine

## 2013-09-22 ENCOUNTER — Encounter: Payer: Self-pay | Admitting: Family Medicine

## 2013-09-22 VITALS — BP 156/90 | HR 76 | Temp 98.0°F | Wt 214.0 lb

## 2013-09-22 DIAGNOSIS — M545 Low back pain, unspecified: Secondary | ICD-10-CM

## 2013-09-22 DIAGNOSIS — I10 Essential (primary) hypertension: Secondary | ICD-10-CM

## 2013-09-22 DIAGNOSIS — G8929 Other chronic pain: Secondary | ICD-10-CM

## 2013-09-22 DIAGNOSIS — G253 Myoclonus: Secondary | ICD-10-CM

## 2013-09-22 MED ORDER — PREGABALIN 50 MG PO CAPS: 50.0000 mg | ORAL_CAPSULE | Freq: Three times a day (TID) | ORAL | Status: DC

## 2013-09-22 MED ORDER — HYDROCODONE-ACETAMINOPHEN 7.5-325 MG PO TABS: 1.0000 | ORAL_TABLET | ORAL | Status: DC | PRN

## 2013-09-22 MED ORDER — LISINOPRIL 20 MG PO TABS: 20.0000 mg | ORAL_TABLET | Freq: Every day | ORAL | Status: DC

## 2013-09-22 MED ORDER — ROPINIROLE HCL 0.25 MG PO TABS: 1.0000 mg | ORAL_TABLET | Freq: Every evening | ORAL | Status: DC | PRN

## 2013-09-22 NOTE — Progress Notes (Signed)
Subjective:     Patient ID: Todd Hayes, male   DOB: [birth year], 57 y.o.   MRN: [id number]  HPI  MYOCLONIC JERKING Reports significant improvement in night-time muscle jerks after starting Ropinirole 0.25mg  tabs at last visit. He is taking 1-2 tabs at bedtime with good relief. Interested in continuing this medication and a refill today.   CHRONIC LOW-BACK PAIN Reports chronic LBP, without any worsening. Continues to achieve moderate control of pain with Norco 7.5/325 (taking 4-6 tabs daily). Today, reports baseline pain 6/10 Low Back (bilaterally). Worse with standing for prolonged periods, flexion / extension, and when getting up from seated position. He is able to remain active daily. Denies any significant functional impairment. Current meds - Norco 7.5/325 q 4-6 hr PRN, Mobic 15mg  daily  CHRONIC HTN: Elevated BP today, 156/90. Reports no concerns. Current Meds - Lisinopril 20mg  daily   Reports good compliance, he took med today. Tolerating well, w/o complaints. Lifestyle - Denies regular exercise, but does stay active. Denies CP, dyspnea, claudication, HA, edema, dizziness / lightheadedness  Social Hx: Continues to smoke 1ppd. Currently not interested in smoking cessation.  Review of Systems  See above HPI. Otherwise ROS negative. Denies fever/chills, chest pain, dyspnea, abd pain, nausea / vomiting, weakness, numbness or tingling.     Objective:   Physical Exam  BP Re-checked manually at 140/80.  BP 156/90  Pulse 76  Temp(Src) 98 F (36.7 C) (Oral)  Wt 214 lb (97.07 kg)  Gen - comfortable, NAD  Neck - supple, non-tender, no LAD  Heart - RRR, no murmurs  Lungs - CTAB. No wheezing or crackles. Nml wob  Ext - no edema, non-tender, +2 peripheral pulses  MSK - Back: without gross defects or postural deformity, spine non-tender to palpation, reduced flexion/extension L-spine Neuro - grossly non-focal, intact +2 DTR bilateral (brachiorad, biceps, patellar, achilles),  muscle str 5/5 bilateral ext, gait normal     Assessment:     See specific A&P problem list for details.      Plan:     See specific A&P problem list for details.

## 2013-09-22 NOTE — Patient Instructions (Addendum)
Dear Britt Bottom Falkenstein,  Thank you for coming in to clinic today. It was good to see you!  Today we discussed your Pain Medications 1. We will continue with the Vicodin 7.5/325 pills. I have printed out 3 different prescriptions. Pay close attention to the dates. Each one is good for 1 month of pills. Please check with [facility] pharmacy how they would like you to fill them. Either turn them all in, or bring them one at a time. 2. Continue taking the Ropinerole 0.25mg  (you may take 1-2 tablets and up to 4) (new medicine that treats the muscle jerks at night). I have sent refills 3. I have printed a prescription for Lyrica 50mg  tablets, you are to take 1 tablet every 8 hours or 3 times a day for nerve pain. This medicine is similar to the Neurontin, so you may feel a little drowsy, also it will take a week or two for you get receive it, as your insurance has to approve it. Also, as we discussed it may be a little expensive.  Please schedule a follow-up appointment with me in 3 months for refills of medications. Or sooner if your symptoms get worse.  If you have any other questions or concerns, please feel free to call the clinic to contact me. You may also schedule an earlier appointment if necessary.  However, if your symptoms get significantly worse, please go to the Emergency Department to seek immediate medical attention.  Saralyn Pilar, DO  [facility]

## 2013-09-22 NOTE — Assessment & Plan Note (Addendum)
Stable baseline chronic LBP, unchanged Current Meds - Norco 7.5/325mg  q 4 hr PRN, Mobic 15mg  daily  Failed Meds - Gabapentin, Flexeril - reports these meds give him worse "muscle jerks"  Last UDS (06/2013) consistent with appropriate Norco use.  Plan:  1. Refilled Norco 7.5/325 q 4 hr PRN pain (max 6 tabs daily). Printed 3 separate prescriptions (#180, 0 refills) for 3 month supply, with specified fill dates. 2. Not interested in switching to Oxycodone today, despite his concerns for Tylenol component of Norco giving him muscle jerks. 3. Start Lyrica 50mg  TID (Failed Gabapentin), concern due to potential cost, but patient willing to try   Future Consideration:  1. Possible Elavil or Cymbalta for chronic pain? Consider tapering off Trazodone (reports ineffective)  2. If pain remains unchanged consider future referral to pain clinic

## 2013-09-22 NOTE — Assessment & Plan Note (Signed)
Previously well-controlled HTN, Elevated BP today 156/90. Re-checked manually at 140/80  Meds - Lisinopril 20mg  daily No complications   Plan:  1. Continue current BP meds.  2. Plan to re-check CMET in 01/2013 (last Cr 1.21) 3. Lifestyle Mods - Continues to not be interested in smoking cessation 4. Due to elevated BP today, advised to monitor BP at home or at drug store occasionally.

## 2013-09-22 NOTE — Assessment & Plan Note (Addendum)
Last visit started Ropinirole 0.25mg  with significant improvement, suspect restless leg syndrome Takes 1-2 tabs nightly (0.25mg  tabs), may take up to 4 tabs (1mg ) nightly  Plan: 1. Refilled Ropinirole 0.25mg  tabs 2. Continue to monitor  Future Consideration: 1. If persistent, RLS may be related to low ferritin level if < 75, may benefit from iron replacement

## 2013-10-02 ENCOUNTER — Telehealth: Payer: Self-pay

## 2013-10-02 NOTE — Telephone Encounter (Signed)
Medication is ready at the pharmacy and pt was given three refills 09/22/13.  Pt notified.

## 2013-10-02 NOTE — Telephone Encounter (Signed)
Refill request for rOPINIRole.

## 2013-10-26 ENCOUNTER — Encounter: Payer: Self-pay | Admitting: Family Medicine

## 2013-10-26 ENCOUNTER — Ambulatory Visit (INDEPENDENT_AMBULATORY_CARE_PROVIDER_SITE_OTHER): Payer: Medicare Other | Admitting: Family Medicine

## 2013-10-26 VITALS — BP 150/75 | HR 90 | Temp 98.1°F | Wt 216.0 lb

## 2013-10-26 DIAGNOSIS — R251 Tremor, unspecified: Secondary | ICD-10-CM

## 2013-10-26 DIAGNOSIS — G894 Chronic pain syndrome: Secondary | ICD-10-CM

## 2013-10-26 DIAGNOSIS — R259 Unspecified abnormal involuntary movements: Secondary | ICD-10-CM

## 2013-10-26 MED ORDER — OXYCODONE HCL 5 MG PO CAPS: 5.0000 mg | ORAL_CAPSULE | Freq: Four times a day (QID) | ORAL | Status: DC | PRN

## 2013-10-26 NOTE — Patient Instructions (Signed)
Please follow up with your doctor in 2 weeks. Changing narcotic medicine by another doctor is usually not allowed and this is a one time exception.

## 2013-10-27 ENCOUNTER — Telehealth: Payer: Self-pay | Admitting: Family Medicine

## 2013-10-27 DIAGNOSIS — R251 Tremor, unspecified: Secondary | ICD-10-CM | POA: Insufficient documentation

## 2013-10-27 NOTE — Progress Notes (Signed)
Patient ID: Todd Hayes    DOB: Mar 03, 1956, 57 y.o.   MRN: [id number] --- Subjective:  Todd Hayes is a 57 y.o.male who presents as a 57 y.o.male who presents to discuss tremors.  - patient reports having "shakes" in his hands and feet, more in hands, that has been ongoing for 2-3 months. He is convinced it is from the tylenol from his vicodin and would like for it to be changed. At his last visit, Dr. Althea Charon had offered to switch him from vicodin to oxycodone but patient wanted to wait it out and see. He is now tired of shaking and would like it changed.  Shaking occurs when he holds his hands still. It doesn't interfere with eating. It doesn't occur when he rests his hand on something. He drinks 4 beers a night every night and last he had a drink was yesterday. He says that tremor goes away with alcohol. He states that his brother had leg jerks of the lower extremities like he does, but he's not sure that he had tremors.  He is on ropinirole for restless leg syndrome which has helped. He is also on lyrica which helps as well.   ROS: see HPI Past Medical History: reviewed and updated medications and allergies. Social History: Tobacco: current smoker  Objective: Filed Vitals:   10/26/13 1159  BP: 150/75  Pulse: 90  Temp: 98.1 F (36.7 C)    Physical Examination:   General appearance - alert, well appearing, and in no distress Chest - clear to auscultation, no wheezes, rales or rhonchi, symmetric air entry Heart - normal rate, regular rhythm, normal S1, S2, no murmurs, rubs, clicks or gallops Neuro - CN2-12 grossly intact, fine high frequency tremor bilaterally postural tremor, not present at rest, normal finger to nose bilaterally

## 2013-10-27 NOTE — Telephone Encounter (Signed)
Pt received RX for 15 days of oxycodene.  He could not get an appt w/ Karmalegos until Jan 6. He will run out of meds before the appt Please advise

## 2013-10-27 NOTE — Assessment & Plan Note (Signed)
Differential includes drug reaction vs essential tremor vs Parkinson's. Tremor is not a resting tremor and is higher frequency than the typical Parkinon's tremor, making Parkinson's unlikely.  Medications that patient is currently taking include lyrica, requip and trazodone which all have tremor as their side effect profile. It looks like lyrica was started after concern of tremor so less likely cause. Trazodone may be the next medicine to try to taper.  Patient is convinced that tylenol is causing the tremor. I discussed that normally, non PCP providers do not initiate a new narcotic, but that since there had been a prior documented discussion with his PCP about switching to oxycodone, I would be willing to do it exceptionally. Warned him that this will not happen again since he is now informed. Gave him 2 weeks worth of oxycodone 5mg  qid/prn for him to follow up with PCP in 2 weeks.  Additionally, if medication side effect not cause of tremor, there may be a component of essential tremor and he may benefit from propranolol.

## 2013-10-27 NOTE — Telephone Encounter (Signed)
Pt was seen yesterday by Dr. Gwenlyn Saran.  Will fwd message to PCP and Dr. Gwenlyn Saran.  Juline Sanderford, Darlyne Russian, CMA

## 2013-10-28 ENCOUNTER — Other Ambulatory Visit: Payer: Self-pay | Admitting: Family Medicine

## 2013-10-28 NOTE — Telephone Encounter (Signed)
Will fwd refill request to Md.  Jayin Derousse, Darlyne Russian, CMA

## 2013-10-29 ENCOUNTER — Telehealth: Payer: Self-pay | Admitting: *Deleted

## 2013-10-29 NOTE — Telephone Encounter (Signed)
Thank you Thekla.  Note, I will not be able to print and sign new rx until tomorrow (Friday 19th), and it will not be ready today. Patient will need to come by tomorrow after 1:00pm to pick up new rx.  Saralyn Pilar, DO [facility], PGY-1

## 2013-10-29 NOTE — Telephone Encounter (Signed)
Called pt. rx will be done tomorrow afternoon. Todd Hayes, Todd Hayes

## 2013-10-29 NOTE — Telephone Encounter (Signed)
I last saw Todd Hayes in clinic on 09/22/13, at that he requested a 3 month supply of his pain medication Vicodin. I printed 3 separate prescriptions with specific fill dates (09/22/13, 10/12/13, and 11/12/13) each for 1 month supply. I just spoke with [facility] pharmacy, and they have only filled the initial 09/22/13 prescription, as he has yet to turn in the other 2, which have since been discontinued. Also, I have spoke with Todd Hayes regarding her recent visit and reviewed her note with her plan.  At the visit with Todd Hayes 10/26/13, Todd Hayes was switched to Oxycodone IR 5mg  tablets x 4 daily (#60, 0 refills). If this is working much better for him, and has resolved his concern with his tremors, then I will be glad to write him for another 2 week supply until our upcoming appointment on 11/17/13. However, if his tremors are about the same and there is no significant difference between the two pain medicines, then I could write him a 2 week supply of his old Vicodin pain medication.  If you could, please call Todd Hayes to clarify if the new pain medication is more effect or not. I'll plan to stop by [facility] and drop off a new prescription tomorrow 12/19 for him to pick-up.  Thank you. Saralyn Pilar, DO [facility], PGY-1

## 2013-10-29 NOTE — Telephone Encounter (Signed)
Called pt. Informed. He reports, that his tremors are much better with the Oxycodone. He will come to the clinic later on today or tomorrow to pick up the Rx. He is aware of his upcoming appt. Thank you. Lorenda Hatchet, Renato Battles

## 2013-10-30 ENCOUNTER — Other Ambulatory Visit: Payer: Self-pay | Admitting: Family Medicine

## 2013-10-30 DIAGNOSIS — G894 Chronic pain syndrome: Secondary | ICD-10-CM

## 2013-10-30 MED ORDER — OXYCODONE HCL 5 MG PO CAPS: 5.0000 mg | ORAL_CAPSULE | Freq: Four times a day (QID) | ORAL | Status: DC | PRN

## 2013-11-02 ENCOUNTER — Telehealth: Payer: Self-pay | Admitting: Family Medicine

## 2013-11-02 NOTE — Telephone Encounter (Signed)
Pt called and needs a refill on his Trazodone sent to the pharmacy. jw

## 2013-11-02 NOTE — Telephone Encounter (Signed)
Will fwd to Md for review.  Tharon Kitch L, CMA  

## 2013-11-03 MED ORDER — TRAZODONE HCL 100 MG PO TABS: 200.0000 mg | ORAL_TABLET | Freq: Every day | ORAL | Status: DC

## 2013-11-10 ENCOUNTER — Telehealth: Payer: Self-pay | Admitting: Family Medicine

## 2013-11-10 NOTE — Telephone Encounter (Signed)
Message copied by Smitty Cords on Tue Nov 10, 2013 10:48 AM ------      Message from: Abram Sander      Created: Fri Oct 30, 2013  1:27 PM      Regarding: RE: Oxy Pain Med Rx - pick-up at [facility] signed and placed at front desk for pick-up. Please inform patient.            ----- Message -----         From: Saralyn Pilar, DO         Sent: 10/30/2013  12:37 PM           To: Beverely Low, MD, [facility]      Subject: RE: Oxy Pain Med Rx - pick-up at Clinic                  Unfortunately I am unable to make it over to clinic today to print the Rx. Michelle Nasuti has offered to print, sign, and leave the Oxycodone IR 5 mg (#60, 0 refill) prescription in the clinic at nurses station / front desk for Todd Hayes to pick-up today.            Please confirm that you receive this rx, and then call [name] to let him know to pick it up when ready.            Thank you all so much!            Saralyn Pilar, DO      [facility], PGY-1             ------

## 2013-11-17 ENCOUNTER — Encounter: Payer: Self-pay | Admitting: Family Medicine

## 2013-11-17 ENCOUNTER — Ambulatory Visit (INDEPENDENT_AMBULATORY_CARE_PROVIDER_SITE_OTHER): Payer: Medicare Other | Admitting: Family Medicine

## 2013-11-17 VITALS — BP 170/92 | HR 68 | Temp 98.3°F | Ht 72.0 in | Wt 218.0 lb

## 2013-11-17 DIAGNOSIS — R259 Unspecified abnormal involuntary movements: Secondary | ICD-10-CM | POA: Diagnosis not present

## 2013-11-17 DIAGNOSIS — G8929 Other chronic pain: Secondary | ICD-10-CM

## 2013-11-17 DIAGNOSIS — I10 Essential (primary) hypertension: Secondary | ICD-10-CM

## 2013-11-17 DIAGNOSIS — G253 Myoclonus: Secondary | ICD-10-CM | POA: Diagnosis not present

## 2013-11-17 DIAGNOSIS — R251 Tremor, unspecified: Secondary | ICD-10-CM

## 2013-11-17 MED ORDER — OXYCODONE-ACETAMINOPHEN 7.5-325 MG PO TABS: 1.0000 | ORAL_TABLET | ORAL | Status: DC | PRN

## 2013-11-17 NOTE — Patient Instructions (Signed)
Dear Mr. Todd Hayes, Thank you for coming in to clinic today. It was good to see you!  Today we discussed your Pain Medicines. 1. We will start you back on your Percocet today and give you a 3 month supply, and discontinue the Oxycodone pain medicine. 2. I believe that you muscle jerks might be related to your sleeping pill (Trazodone), please follow the plan listed below to decrease this medicine:    - 1 week take 2 tablets (200mg  at bedtime)    - 2nd week take 1.5 tablets (150mg  at bedtime)    - 3rd week take 1 tablet (100mg  at bedtime)    - 4th week take 0.5 tablet (50mg  at bedtime)    - 5th week completely stop takingTrazodone 3. Blood pressure - I recommend taking the whole BP pill (not just half of it), however if you keep checking your pressure at the drug store / [employer], make sure that you come in if it is elevated (>180/100), or if you have any concerning symptoms such as Chest Pain, Headache, Vision Problems, or Weakness. 4. Continue your other medications, at our next visit we can discuss alternative options  Some important numbers from today's visit: BP - 170/92, re-checked at 150/85  Please schedule a follow-up appointment with me in 3 months.  If you have any other questions or concerns, please feel free to call the clinic to contact me. You may also schedule an earlier appointment if necessary.  However, if your symptoms get significantly worse, please go to the Emergency Department to seek immediate medical attention.  [name], [place]

## 2013-11-17 NOTE — Assessment & Plan Note (Signed)
Previously better controlled HTN, Elevated BP today 170/92. Re-checked manually at 154/86  Meds - Lisinopril 20mg  daily (only takes half tab, 10mg  daily - d/t cost)  Plan:  1. Recommended taking full dose Lisinopril (1 tab, 20mg  daily) 2. Plan to re-check CMET in 01/2013 (last Cr 1.21)  3. Lifestyle Mods - Continues to not be interested in smoking cessation  4. Due to elevated BP today, advised to monitor BP at home or at drug store occasionally, and RTC if elevated or develops concerning symptoms (HA, CP, SOB, weakness, vision changes)

## 2013-11-17 NOTE — Assessment & Plan Note (Signed)
Stable at baseline chronic LBP Current Meds - Oxy IR 5mg , Mobic 15mg , Lyrica 50mg  TID, Trazodone 200mg  nightly (assist with sleep in setting of chronic pain) Persistent myoclonic jerks /   Plan: 1. Discontinue Oxycodone, resume previous Percocet 7.5/325mg  q 4 hr PR (printed x 3 month rx's #180, 0 refills each fill dates now, 12/13/13, 01/10/14). Continue Mobic, Lyrica 2. RTC 3 months or sooner if worsening pain

## 2013-11-17 NOTE — Progress Notes (Deleted)
Subjective:     Patient ID: Todd Hayes, male   DOB: 1956-07-13, 58 y.o.   MRN: [id number]  HPI  Muscle  Review of Systems     Objective:   Physical Exam     Assessment:     ***    Plan:     ***

## 2013-11-17 NOTE — Assessment & Plan Note (Signed)
Persistent jerks despite switch from Percocet to Oxycodone, evidence that Tylenol not inciting factor. Continues significantly reduced jerks at night with Ropinirole Suspect Trazodone could be possible etiology for myoclonic jerks (pt non-adherence to rx and taking 250 to 300mg  nightly despite rx for 200mg )  Plan: 1. Taper off Trazodone over 1 month (200mg  x 1 week and then reduce by 50mg  weekly until off), trial off for at least 1 month 2. If effective in reducing symptoms, can consider alternative sleep agent such as Elavil, Ambien 3. Continue ropinirole

## 2013-11-17 NOTE — Progress Notes (Signed)
Subjective:     Patient ID: Todd Hayes, male   DOB: [birth year], 58 y.o.   MRN: [id number]  HPI   MYOCLONIC JERKS / POSTURAL TREMORS Reports persistent muscle "jerks" after trial of Oxycodone (Oxy IR 5mg ), which replaced Percocet 7.5/325mg , since patient had been convinced it was the Tylenol in the Percocet that was causing his symptoms.Today requested to resume previous Percocet therapy. Notes muscle jerks are mostly bilateral arms, infrequent and occur intermittently throughout the day, and not provoked by anything in particular. Continued improvement in nighttime lower extremity jerks, continued Ropinirole nightly Interested in tapering down Trazodone as previously discussed as a potential cause of the jerks, note he takes 250 to 300mg  Trazodone nightly (despite rx of 200mg  nightly)  CHRONIC PAIN SYNDROME: Reports no change in chronic back pain, previously better controlled on Percocet vs new trial of Oxycodone. Today, reports baseline pain 6-7/10 Low Back (bilaterally). Worse with standing for prolonged periods, flexion / extension, and when getting up from seated position. He is able to remain active daily. Denies any significant functional impairment.  CHRONIC HTN: Elevated BP today, 170/92. Reports no concerns, has occasionally checked BP at [employer] around 140/70 Current Meds - Lisinopril 20mg  daily Reports taking only half tab, 10mg  daily to save money. Tolerating well, w/o complaints. Lifestyle - Denies regular exercise, remains active around house. Denies CP, dyspnea, claudication, HA, edema, dizziness / lightheadedness  Social Hx: Continues to smoke. Currently not interested in smoking cessation. Continues to drink beer nightly  Review of Systems  See above HPI. Otherwise ROS negative. Denies fever/chills, CP, SOB, HA, vision changes, weakness, numbness or tingling.     Objective:   Physical Exam   BP Re-checked manually at 154/86.  BP 170/92  Pulse 68  Temp(Src)  98.3 F (36.8 C) (Oral)  Ht 6' (1.829 m)  Wt 218 lb (98.884 kg)  BMI 29.56 kg/m2  Gen - pleasant but anxious appearing, NAD  Heart - RRR, no murmurs  Lungs - CTAB. No wheezing or crackles. Nml wob  Ext - no edema, non-tender, +2 peripheral pulses  Neuro - grossly non-focal, intact muscle str 5/5 bilateral ext, fine high frequency tremor of bilateral UE with holding arms out at shoulder height (no resting tremor), no ataxia with finger-nose testing, gait normal     Assessment:     See specific A&P problem list for details.      Plan:     See specific A&P problem list for details.

## 2013-11-17 NOTE — Assessment & Plan Note (Addendum)
Suspect postural tremor based on clinical exam Consider medication side-effect possibly from Trazodone, additionally may be opiate neurotoxicity (increased sensitization from chronic opiate use).  Plan: 1. Taper Trazodone as detailed under Myoclonic Jerking  Considerations - Possible related to chronic opiate use, maybe trial of Tramadol would be effective with additional NMDA inhibition (reduce excitatory response from chronic opiate therapy) - If benign essential tremor, maybe respond to propanolol if ruled out alternative etiologies

## 2013-12-18 ENCOUNTER — Telehealth: Payer: Self-pay | Admitting: Family Medicine

## 2013-12-18 NOTE — Telephone Encounter (Signed)
Message given to patient.  He states he has been tapering off the trazodone.  He said he is down to 50 mg a night.  I asked patient to please bring in all medications and to please schedule OV with PCP>  Zoriah Pulice, Loralyn Freshwater, CMA

## 2013-12-18 NOTE — Telephone Encounter (Signed)
Pt called and would like to change his sleeping medication. He said that the doctor knew which one he is talking about. He also would like someone to call him so that he knows that it is there. jw

## 2013-12-18 NOTE — Telephone Encounter (Signed)
Reviewed patient's chart, as per last office visit 11/17/13 with me, our plan was for patient to taper completely off of Trazodone by 1 month.  Previously taking >200mg  nightly, proposed plan at that time was to start taper with Trazodone 200mg  nightly x 1 week, and then reduce by 50mg  weekly until completely off after 4 weeks.  It has been 4 weeks since last OV, we discussed potential alternative sleeping medicines, however I don't feel comfortable starting a new agent until he is tapered off of the Trazodone, also would need to investigate what dose he is still taking, if he attempted to taper, and what prevented him from coming off of the medicine, especially if it is making him nervous. Additionally, his symptoms may be due to other medications, and would require him to be seen in clinic for an OV.  If you could please call patient back, and relay this message. Recommend adhering to Trazodone taper (this medicine can't be stopped all at once), try OTC sleep aid temporarily if needed, schedule upcoming OV with me or other resident within 1-2 weeks. I have an available apt on Monday 12/21/13.  [name], [place], PGY-1

## 2013-12-18 NOTE — Telephone Encounter (Signed)
Called pt. He reports, that the trazedone makes him real nervous. He thinks, that Dr.Karamalegos wanted to change it due to his side effects. I told the pt that I would send the message and that we will call him back. Pt reports, that he took Trazedone last night and felt real nervous again this morning. Javier Glazier, Gerrit Heck

## 2013-12-28 DIAGNOSIS — S3421XA Injury of nerve root of lumbar spine, initial encounter: Secondary | ICD-10-CM | POA: Diagnosis not present

## 2014-01-13 ENCOUNTER — Other Ambulatory Visit: Payer: Self-pay | Admitting: Family Medicine

## 2014-01-13 NOTE — Telephone Encounter (Signed)
Will fwd to PCP.  Sherif Millspaugh L, CMA  

## 2014-01-15 ENCOUNTER — Other Ambulatory Visit: Payer: Self-pay | Admitting: *Deleted

## 2014-01-15 DIAGNOSIS — Z7689 Persons encountering health services in other specified circumstances: Secondary | ICD-10-CM | POA: Diagnosis not present

## 2014-01-15 MED ORDER — ROPINIROLE HCL 0.25 MG PO TABS: 1.0000 mg | ORAL_TABLET | Freq: Every evening | ORAL | Status: DC | PRN

## 2014-01-15 NOTE — Telephone Encounter (Signed)
Pt called about refills on trazadone and ropinirole (sleeping pill) Please advise

## 2014-01-15 NOTE — Telephone Encounter (Signed)
Reviewed chart, previously discussed tapering off of Trazodone, 12/2012 patient had reported taking only Trazodone 50mg  nightly, refilled rx for Trazodone 100mg  tabs take half tab nightly (#60, 0 refills), also re-ordered Ropinerole.  Next OV with me scheduled for 02/04/14, will re-evaluate need for Trazodone at that time.  [name], [place], PGY-1

## 2014-01-15 NOTE — Telephone Encounter (Signed)
Will fwd to MD.  Graiden Henes L, CMA  

## 2014-01-18 ENCOUNTER — Other Ambulatory Visit: Payer: Self-pay | Admitting: Family Medicine

## 2014-01-21 DIAGNOSIS — R059 Cough, unspecified: Secondary | ICD-10-CM | POA: Diagnosis not present

## 2014-01-21 DIAGNOSIS — J440 Chronic obstructive pulmonary disease with acute lower respiratory infection: Secondary | ICD-10-CM | POA: Diagnosis not present

## 2014-02-01 ENCOUNTER — Telehealth: Payer: Self-pay | Admitting: Family Medicine

## 2014-02-01 MED ORDER — PANTOPRAZOLE SODIUM 20 MG PO TBEC: 20.0000 mg | DELAYED_RELEASE_TABLET | Freq: Every day | ORAL | Status: DC

## 2014-02-01 NOTE — Telephone Encounter (Signed)
Refill request for Protonix 

## 2014-02-01 NOTE — Telephone Encounter (Signed)
Done. .Todd Hayes  

## 2014-02-04 ENCOUNTER — Encounter: Payer: Self-pay | Admitting: Family Medicine

## 2014-02-04 ENCOUNTER — Ambulatory Visit (INDEPENDENT_AMBULATORY_CARE_PROVIDER_SITE_OTHER): Payer: Medicare Other | Admitting: Family Medicine

## 2014-02-04 VITALS — BP 126/75 | HR 87 | Temp 98.7°F | Ht 72.0 in | Wt 206.3 lb

## 2014-02-04 DIAGNOSIS — I10 Essential (primary) hypertension: Secondary | ICD-10-CM

## 2014-02-04 DIAGNOSIS — G8929 Other chronic pain: Secondary | ICD-10-CM

## 2014-02-04 DIAGNOSIS — G4700 Insomnia, unspecified: Secondary | ICD-10-CM | POA: Diagnosis not present

## 2014-02-04 DIAGNOSIS — Z1211 Encounter for screening for malignant neoplasm of colon: Secondary | ICD-10-CM

## 2014-02-04 LAB — COMPREHENSIVE METABOLIC PANEL
ALT: 13 U/L (ref 0–53)
AST: 14 U/L (ref 0–37)
Albumin: 4.3 g/dL (ref 3.5–5.2)
Alkaline Phosphatase: 62 U/L (ref 39–117)
BUN: 12 mg/dL (ref 6–23)
CALCIUM: 9.3 mg/dL (ref 8.4–10.5)
CHLORIDE: 102 meq/L (ref 96–112)
CO2: 26 mEq/L (ref 19–32)
CREATININE: 1.3 mg/dL (ref 0.50–1.35)
Glucose, Bld: 102 mg/dL — ABNORMAL HIGH (ref 70–99)
Potassium: 4.4 mEq/L (ref 3.5–5.3)
SODIUM: 137 meq/L (ref 135–145)
TOTAL PROTEIN: 6.8 g/dL (ref 6.0–8.3)
Total Bilirubin: 0.8 mg/dL (ref 0.2–1.2)

## 2014-02-04 MED ORDER — OXYCODONE-ACETAMINOPHEN 7.5-325 MG PO TABS: 1.0000 | ORAL_TABLET | ORAL | Status: AC | PRN

## 2014-02-04 MED ORDER — MELOXICAM 15 MG PO TABS: ORAL_TABLET | ORAL | Status: DC

## 2014-02-04 MED ORDER — TRAZODONE HCL 150 MG PO TABS: 300.0000 mg | ORAL_TABLET | Freq: Every day | ORAL | Status: DC

## 2014-02-04 MED ORDER — OXYCODONE-ACETAMINOPHEN 7.5-325 MG PO TABS: 1.0000 | ORAL_TABLET | ORAL | Status: DC | PRN

## 2014-02-04 MED ORDER — OXYCODONE-ACETAMINOPHEN 7.5-325 MG PO TABS
1.0000 | ORAL_TABLET | ORAL | Status: DC | PRN
Start: 2014-02-04 — End: 2014-02-04

## 2014-02-04 NOTE — Assessment & Plan Note (Signed)
Repeat discussion on importance of colon cancer screening. Continued to decline colonoscopy at this time.

## 2014-02-04 NOTE — Assessment & Plan Note (Signed)
Stable at baseline chronic LBP Current Meds - Percocet 7.5/325 q 4 hr PRN, Mobic 15mg , Lyrica 50mg  TID  Plan: 1. Refilled Percocet 7.5/325 q 4 hr PRN (#180, 0 refill) - printed x 3 prescriptions, fill dates (now, 02/24/14, and 03/26/14) 2. Refilled Mobic (#90, 0 refill) 3. Continue Lyrica 4. RTC 3 months or sooner if worsening pain

## 2014-02-04 NOTE — Assessment & Plan Note (Signed)
Recent titration down on Trazodone, unclear if patient adhered to plan. Note self titrated back to 200mg  and more due to worsening insomnia, and no change on myoclonic jerks.  Plan: 1. Increase Trazodone to 300mg  nightly (150mg  tabs), advised not to take more than 450mg  nightly

## 2014-02-04 NOTE — Progress Notes (Signed)
Patient ID: Todd Hayes, male   DOB: [birth year], 58 y.o.   MRN: [id number] Subjective:    HPI  CHRONIC PAIN SYNDROME: Stable back pain, reports baseline pain 6/10 (bilateral low back). Controlled on Percocet 7.5/325 takes up to 6 daily. - Recent worsening of back pain due to illness. Now resolved, but reported flu-like symptoms and sick contacts with "stomach bug". Went to Urgent Care and given Azithromycin (Z-pack course) - Worse with significant flexion / extension, and when getting up from seated position. He is able to remain active daily. Denies any significant functional impairment. - Admits to occasional numbness in feet and legs (chronic) - Denies significant weakness, saddle anesthesia, loss of bowel / bladder incontinence - Note recent weight loss (12 lb in 3 months), attributed to poor appetite during recent flu-like illness  INSOMNIA: - Previously prescribed Trazodone 200mg  nightly for sleep. Within past several months, attempted to taper down and off Trazodone due to concerns of potentially associated muscle jerks. Reported decreased down to 50mg  nightly, with worsened sleep but no significant worsening of muscle jerks. - Self titrated back up to at least 200mg  nightly, admits to taking additional half tablets until falling asleep up to 300mg  nightly - Unclear if ever fully titrated down and for how long - Interested in increasing dose today for improved sleep  CHRONIC HTN: Improved BP today 126/75 Reports no concerns Current Meds - Taking only half Lisinopril 20mg  daily (10mg  total) Tolerating well, w/o complaints. Lifestyle - Denies regular exercise Denies CP, dyspnea, claudication, HA, edema, dizziness / lightheadedness  Social Hx: Continues to smoke < 0.5ppd. Reports plans to try electronic cigarettes, prior to considering quitting in future  Review of Systems See above HPI     Objective:   Physical Exam  BP 126/75  Pulse 87  Temp(Src) 98.7 F (37.1 C) (Oral)   Ht 6' (1.829 m)  Wt 206 lb 4.8 oz (93.577 kg)  BMI 27.97 kg/m2  Gen - conversational, chronically ill but well-appearing, NAD HEENT - MMM Heart - RRR, no murmurs  Lungs - CTAB. No wheezing or crackles. Normal work of breathing Ext - no edema, non-tender, +2 peripheral pulses  Neuro - grossly non-focal, intact muscle str 5/5 bilateral ext. No significant resting or motor tremors observed. Normal gait     Assessment:     See specific A&P problem list for details.      Plan:     See specific A&P problem list for details.

## 2014-02-04 NOTE — Patient Instructions (Signed)
Dear Mr. Ollen Gross [place]outh, Thank you for coming in to clinic today. It was good to see you again!  Today we discussed your Medications 1. I printed 3 separate refills for your Percocet, pay close attention to the fill dates - 3 month supply 2. Increased Trazodone to 300mg  nightly, please take 2 of the 150mg  tablets at bedtime. Do not take more than 450mg  total per night. 3. Improved blood pressure today - Keep taking your medicine  Some important numbers from today's visit: BP - 120/75  We will check yearly blood work today. I can send you a letter with the results.  Again, I recommend getting a colonoscopy. Please consider this in the future for colon cancer screening.  Please schedule a follow-up appointment with me in 3 months for med refills.  If you have any other questions or concerns, please feel free to call the clinic to contact me. You may also schedule an earlier appointment if necessary.  However, if your symptoms get significantly worse, please go to the Emergency Department to seek immediate medical attention.  [name], [place]

## 2014-02-04 NOTE — Assessment & Plan Note (Signed)
Improved BP control - Patient non-adherent to taking Lisinopril 20mg  daily, still taking 10mg  (cuts in half)  Plan: 1. Continue Lisinopril 10mg  (half 20mg  tab) daily, advised that he may need to take 20mg  in future if elevated BP 2. Re-check annual CMET - mail lab results 3. Smoking cessation - plan to try electronic cigs, not ready to quit 4. Continue home BP checks

## 2014-02-05 ENCOUNTER — Encounter: Payer: Self-pay | Admitting: Family Medicine

## 2014-02-05 ENCOUNTER — Telehealth: Payer: Self-pay | Admitting: Family Medicine

## 2014-02-05 NOTE — Telephone Encounter (Signed)
Will FWD to MD.  Zorian Gunderman L, CMA  

## 2014-02-05 NOTE — Telephone Encounter (Signed)
Ordered refill and sent to appropriate pharmacy. Pharmacy change updated in chart.  No further action needed.  [name], [place], PGY-1

## 2014-02-05 NOTE — Telephone Encounter (Signed)
Pt called and needs his mobic re-sent to the [facility] on [address] in [place]. jw

## 2014-02-17 DIAGNOSIS — M549 Dorsalgia, unspecified: Secondary | ICD-10-CM | POA: Diagnosis not present

## 2014-03-15 ENCOUNTER — Encounter: Payer: Self-pay | Admitting: Family Medicine

## 2014-03-15 ENCOUNTER — Ambulatory Visit (INDEPENDENT_AMBULATORY_CARE_PROVIDER_SITE_OTHER): Payer: Medicare Other | Admitting: Family Medicine

## 2014-03-15 VITALS — BP 119/80 | HR 84 | Ht 72.0 in | Wt 208.9 lb

## 2014-03-15 DIAGNOSIS — G8929 Other chronic pain: Secondary | ICD-10-CM

## 2014-03-15 MED ORDER — DULOXETINE HCL 30 MG PO CPEP: 60.0000 mg | ORAL_CAPSULE | Freq: Every day | ORAL | Status: DC

## 2014-03-15 NOTE — Assessment & Plan Note (Signed)
Stable / improved pain control today - Unclear what meds he is currently taking / dosing, admits to taking Oxycontin 30mg  x 1 from brother, Klonopin 0.5mg  x 1 from sister, and "doubling" current Percocet rx  Plan: 1. Discussed / reviewed pain contract and his breech by taking others pain meds 2. Explained that I am not comfortable changing to Oxycodone and increasing his dose. Offered for him to go back to Pain Clinic (declined) and also stated that if he chooses he may transfer his care to the same clinic as his brother (will consider, but wants to remain at [facility] at this time) 3. Agreeable to alternative med trial - rx Cymbalta 30mg  daily x 1 week, then 60mg  daily (chronic pain and assist with anxiety/insomnia) 4. No refills and no changes to existing meds 5. Will continue to rx current Percocet rx as needed

## 2014-03-15 NOTE — Progress Notes (Signed)
Patient ID: Todd Hayes, male   DOB: 1955-12-12, 58 y.o.   MRN: [id number]  Subjective:    HPI  CHRONIC PAIN SYNDROME / BACK PAIN: - Today patient reports that he feels "terrific" and is with minimal back pain 2/10 - States that he hasn't had a "pain pill in 5 days", because c/o that "they aren't working [employer]" (brought [facility] rx), however he states that he will be out prior to his next refill date, as he has previously had to "double up" and take more than prescribed - Reports talking with family about being upset with pain medicine that isn't working that well. Talked to his brother and sister about this concern, and he states that his sister gave him x1 Klonopin 0.5mg  to help him sleep, which did help him sleep - Also reports his brother gave him x 1 Oxycontin 30mg  tablet to take for his pain this morning - He cites these two instances and requests to be started on these prescriptions moving forward - Acknowledged the pain contract previously signed 05/27/13. States he took these meds, "so I knew if they would work, so I could ask you for them next time". Admits he no longer has interest in going to pain clinic because he had a bad experience at a hospital where they wouldn't treat his pain only because he was followed at a pain clinic  INSOMNIA: - C/o of continued insomnia, relieved last night with Klonopin - Previously has admitted to taking variable Trazodone doses, and now wants to stop taking it (despite previous trial of attempting to come off of it, that he did not follow) - Currently taking Trazodone 150mg  nightly  I have reviewed and updated the following as appropriate: allergies and current medications  Social Hx: Continues to smoke < 0.5ppd.  Review of Systems See above HPI     Objective:   Physical Exam  BP 119/80  Pulse 84  Ht 6' (1.829 m)  Wt 208 lb 14.4 oz (94.756 kg)  BMI 28.33 kg/m2  Gen - conversational, well-appearing, NAD Heart - RRR, no murmurs   Lungs - CTAB. No wheezing or crackles. Normal work of breathing MSK - back non-tender to palpation, FROM forward flex / back extension, side-bending Ext - no edema, non-tender, +2 peripheral pulses  Neuro - grossly non-focal, intact muscle str 5/5 bilateral ext. No significant resting or motor tremors observed. Normal gait     Assessment:     See specific A&P problem list for details.      Plan:     See specific A&P problem list for details.

## 2014-03-15 NOTE — Patient Instructions (Signed)
Dear Todd Hayes, Thank you for coming in to clinic today.  Today we discussed your Back Pain. 1. As we discussed and reviewed your Pain Contract that you signed in 2014. I am not comfortable making any change to your Percocet prescription. If you would like to, you may transfer your care to a different primary doctor for your future care, if they may provide you different options for pain control, that we don't normally do at this clinic. 2. However, I do think you will benefit from Cymbalta (which will affect your anxiety and chronic pain) 3. You can continue the Trazodone at 1 tablet nightly, and then plan to come off it if you do not feel it is helping  We started a new medication today to help your Pain. Cymbalta 30mg  tablets, please take 1 tablet for 1 week, and then start taking 2 tablets daily (total max dose 60 mg, do not take any more than 60mg  or 2 tablets)  Please schedule a follow-up appointment with me in 1 month to discuss your pain control.  If you have any other questions or concerns, please feel free to call the clinic to contact me. You may also schedule an earlier appointment if necessary.  However, if your symptoms get significantly worse, please go to the Emergency Department to seek immediate medical attention.  [name], [place]

## 2014-04-10 ENCOUNTER — Emergency Department (HOSPITAL_BASED_OUTPATIENT_CLINIC_OR_DEPARTMENT_OTHER)
Admission: EM | Admit: 2014-04-10 | Discharge: 2014-04-10 | Disposition: A | Discharge status: Home / Self Care | Payer: Medicare Other | Attending: Emergency Medicine | Admitting: Emergency Medicine

## 2014-04-10 ENCOUNTER — Emergency Department (HOSPITAL_BASED_OUTPATIENT_CLINIC_OR_DEPARTMENT_OTHER): Payer: Medicare Other

## 2014-04-10 ENCOUNTER — Encounter (HOSPITAL_BASED_OUTPATIENT_CLINIC_OR_DEPARTMENT_OTHER): Payer: Self-pay | Admitting: Emergency Medicine

## 2014-04-10 DIAGNOSIS — Z791 Long term (current) use of non-steroidal anti-inflammatories (NSAID): Secondary | ICD-10-CM | POA: Diagnosis not present

## 2014-04-10 DIAGNOSIS — G8929 Other chronic pain: Secondary | ICD-10-CM | POA: Diagnosis not present

## 2014-04-10 DIAGNOSIS — M545 Low back pain, unspecified: Secondary | ICD-10-CM | POA: Diagnosis not present

## 2014-04-10 DIAGNOSIS — F17200 Nicotine dependence, unspecified, uncomplicated: Secondary | ICD-10-CM | POA: Insufficient documentation

## 2014-04-10 DIAGNOSIS — M79609 Pain in unspecified limb: Secondary | ICD-10-CM | POA: Diagnosis not present

## 2014-04-10 DIAGNOSIS — Z79899 Other long term (current) drug therapy: Secondary | ICD-10-CM | POA: Diagnosis not present

## 2014-04-10 DIAGNOSIS — E785 Hyperlipidemia, unspecified: Secondary | ICD-10-CM | POA: Insufficient documentation

## 2014-04-10 DIAGNOSIS — I10 Essential (primary) hypertension: Secondary | ICD-10-CM | POA: Insufficient documentation

## 2014-04-10 DIAGNOSIS — M519 Unspecified thoracic, thoracolumbar and lumbosacral intervertebral disc disorder: Secondary | ICD-10-CM | POA: Diagnosis not present

## 2014-04-10 DIAGNOSIS — R209 Unspecified disturbances of skin sensation: Secondary | ICD-10-CM | POA: Insufficient documentation

## 2014-04-10 DIAGNOSIS — K219 Gastro-esophageal reflux disease without esophagitis: Secondary | ICD-10-CM | POA: Insufficient documentation

## 2014-04-10 LAB — CBC WITH DIFFERENTIAL/PLATELET
BASOS ABS: 0 10*3/uL (ref 0.0–0.1)
Basophils Relative: 0 % (ref 0–1)
EOS ABS: 0.1 10*3/uL (ref 0.0–0.7)
EOS PCT: 1 % (ref 0–5)
HCT: 40.9 % (ref 39.0–52.0)
Hemoglobin: 15.1 g/dL (ref 13.0–17.0)
Lymphocytes Relative: 34 % (ref 12–46)
Lymphs Abs: 2.8 10*3/uL (ref 0.7–4.0)
MCH: 37.8 pg — ABNORMAL HIGH (ref 26.0–34.0)
MCHC: 36.9 g/dL — AB (ref 30.0–36.0)
MCV: 102.3 fL — ABNORMAL HIGH (ref 78.0–100.0)
Monocytes Absolute: 0.7 10*3/uL (ref 0.1–1.0)
Monocytes Relative: 9 % (ref 3–12)
Neutro Abs: 4.5 10*3/uL (ref 1.7–7.7)
Neutrophils Relative %: 55 % (ref 43–77)
PLATELETS: 160 10*3/uL (ref 150–400)
RBC: 4 MIL/uL — ABNORMAL LOW (ref 4.22–5.81)
RDW: 12.9 % (ref 11.5–15.5)
WBC: 8.1 10*3/uL (ref 4.0–10.5)

## 2014-04-10 LAB — URINE MICROSCOPIC-ADD ON

## 2014-04-10 LAB — BASIC METABOLIC PANEL
BUN: 16 mg/dL (ref 6–23)
CALCIUM: 9.9 mg/dL (ref 8.4–10.5)
CO2: 27 mEq/L (ref 19–32)
CREATININE: 1.3 mg/dL (ref 0.50–1.35)
Chloride: 98 mEq/L (ref 96–112)
GFR calc Af Amer: 69 mL/min — ABNORMAL LOW (ref >90)
GFR, EST NON AFRICAN AMERICAN: 60 mL/min — AB (ref >90)
GLUCOSE: 118 mg/dL — AB (ref 70–99)
Potassium: 4.1 mEq/L (ref 3.7–5.3)
SODIUM: 139 meq/L (ref 137–147)

## 2014-04-10 LAB — URINALYSIS, ROUTINE W REFLEX MICROSCOPIC
GLUCOSE, UA: NEGATIVE mg/dL
Hgb urine dipstick: NEGATIVE
KETONES UR: 15 mg/dL — AB
LEUKOCYTES UA: NEGATIVE
Nitrite: NEGATIVE
PROTEIN: 30 mg/dL — AB
Specific Gravity, Urine: 1.031 — ABNORMAL HIGH (ref 1.005–1.030)
Urobilinogen, UA: 1 mg/dL (ref 0.0–1.0)
pH: 5.5 (ref 5.0–8.0)

## 2014-04-10 LAB — CK: Total CK: 75 U/L (ref 7–232)

## 2014-04-10 MED ORDER — OXYCODONE-ACETAMINOPHEN 5-325 MG PO TABS: 2.0000 | ORAL_TABLET | ORAL | Status: DC | PRN

## 2014-04-10 MED ORDER — ACETAMINOPHEN 325 MG PO TABS
650.0000 mg | ORAL_TABLET | Freq: Once | ORAL | Status: DC
  Filled 2014-04-10: qty 2

## 2014-04-10 MED ORDER — IBUPROFEN 800 MG PO TABS: 800.0000 mg | ORAL_TABLET | Freq: Once | ORAL | Status: DC

## 2014-04-10 NOTE — ED Notes (Addendum)
Patient c/o lower back and bilateral leg pain that has grown worse since the middle of past week

## 2014-04-10 NOTE — ED Provider Notes (Signed)
CSN: [id number]     Arrival date & time 04/10/14  1820 History  This chart was scribed for Ezequiel Essex, MD by Roe Coombs, ED Scribe. The patient was seen in room MH04/MH04. Patient's care was started at 6:59 PM.   Chief Complaint  Patient presents with  . Back Pain  . Leg Pain    The history is provided by the patient. No language interpreter was used.    HPI Comments: Todd Hayes is a 58 y.o. male with history of chronic back pain pain and neuropathy who presents to the Emergency Department complaining of diffuse bilateral leg pain onset a few days ago after walking too much. Leg pain is worse with ambulation. There is associated numbness and tingling in his feet. Patient is also experiencing an episode of moderate lower back pain that began earlier in the week. He states that back pain is similar in character to his usual chronic pain. Patient states that his PCP started him on Lyrica for neuropathy and he feels the tingling in his feet not improved with use of this medication. Patient has also been taking Perocet 5-325 mg, but he has run out of this. His last dose of Percocet was 3 days ago. He denies vomiting, abdominal pain, bladder incontinence, bowel incontinence, testicular pain, hematuria or any other symptoms at this time. He denies history of DM.   Past Medical History  Diagnosis Date  . Allergy   . Hyperlipidemia   . Hypertension   . Chronic pain   . Insomnia   . GERD (gastroesophageal reflux disease)    Past Surgical History  Procedure Laterality Date  . Shoulder surgery Right    History reviewed. No pertinent family history. History  Substance Use Topics  . Smoking status: Current Every Day Smoker -- 1.00 packs/day    Types: Cigarettes  . Smokeless tobacco: Not on file  . Alcohol Use: Yes    Review of Systems A complete 10 system review of systems was obtained and all systems are negative except as noted in the HPI and PMH.    Allergies   Flexeril  Home Medications   Prior to Admission medications   Medication Sig Start Date End Date Taking? Authorizing Provider  DULoxetine (CYMBALTA) 30 MG capsule Take 2 capsules (60 mg total) by mouth daily. 03/15/14   [name], DO  fenofibrate micronized (LOFIBRA) 134 MG capsule Take 1 capsule (134 mg total) by mouth daily before breakfast. 05/12/12 05/12/13  Luetta Nutting, DO  lisinopril (PRINIVIL,ZESTRIL) 20 MG tablet Take 10 mg by mouth daily. 09/22/13   [name], DO  meloxicam (MOBIC) 15 MG tablet TAKE ONE TABLET BY MOUTH ONCE DAILY 02/04/14   [name], DO  oxyCODONE-acetaminophen (PERCOCET/ROXICET) 5-325 MG per tablet Take 2 tablets by mouth every 4 (four) hours as needed for severe pain. 04/10/14   Ezequiel Essex, MD  pantoprazole (PROTONIX) 20 MG tablet Take 1 tablet (20 mg total) by mouth daily. 02/01/14   [name], DO  pregabalin (LYRICA) 50 MG capsule Take 1 capsule (50 mg total) by mouth 3 (three) times daily. 09/22/13   [name], DO  rOPINIRole (REQUIP) 0.25 MG tablet Take 4 tablets (1 mg total) by mouth at bedtime as needed. 01/15/14   [name], DO  traZODone (DESYREL) 150 MG tablet Take 2 tablets (300 mg total) by mouth at bedtime. 02/04/14   [name], DO   Triage Vitals: BP 128/87  Pulse 106  Temp(Src) 98.4 F (36.9 C) (Oral)  Resp 20  Ht 6' (1.829 m)  Wt 216 lb (97.977 kg)  BMI 29.29 kg/m2  SpO2 97% Physical Exam  Constitutional: He is oriented to person, place, and time. He appears well-developed and well-nourished. No distress.  HENT:  Head: Normocephalic and atraumatic.  Eyes: Conjunctivae and EOM are normal.  Neck: Normal range of motion.  Cardiovascular: Normal rate, regular rhythm and normal heart sounds.   No murmur heard. Pulmonary/Chest: Effort normal. No respiratory distress.  Abdominal: Soft. There is no tenderness.  Musculoskeletal: Normal range of motion. He exhibits no  edema and no tenderness.  Paraspinal lumbar tenderness. No midline tenderness. Soft compartments. No overlying redness or edema. Full ROM of bilateral hips, knees and ankles. 5/5 strength in bilateral lower extremities. Ankle plantar and dorsiflexion intact. Great toe extension intact bilaterally. +2 DP and PT pulses. +2 patellar reflexes bilaterally. Normal gait.  Neurological: He is alert and oriented to person, place, and time. No cranial nerve deficit. He exhibits normal muscle tone. Coordination normal.  Skin: Skin is warm.  Psychiatric: He has a normal mood and affect. His behavior is normal.    ED Course  Procedures (including critical care time) DIAGNOSTIC STUDIES: Oxygen Saturation is 97% on room air, adequate by my interpretation.    COORDINATION OF CARE: 7:05 PM- Patient informed of current plan for treatment and evaluation and agrees with plan at this time.    Labs Review Labs Reviewed  URINALYSIS, ROUTINE W REFLEX MICROSCOPIC - Abnormal; Notable for the following:    Color, Urine AMBER (*)    APPearance CLOUDY (*)    Specific Gravity, Urine 1.031 (*)    Bilirubin Urine SMALL (*)    Ketones, ur 15 (*)    Protein, ur 30 (*)    All other components within normal limits  BASIC METABOLIC PANEL - Abnormal; Notable for the following:    Glucose, Bld 118 (*)    GFR calc non Af Amer 60 (*)    GFR calc Af Amer 69 (*)    All other components within normal limits  CBC WITH DIFFERENTIAL - Abnormal; Notable for the following:    RBC 4.00 (*)    MCV 102.3 (*)    MCH 37.8 (*)    MCHC 36.9 (*)    All other components within normal limits  URINE MICROSCOPIC-ADD ON - Abnormal; Notable for the following:    Bacteria, UA FEW (*)    Crystals CA OXALATE CRYSTALS (*)    All other components within normal limits  CK    Imaging Review Dg Lumbar Spine Complete  04/10/2014   CLINICAL DATA:  BACK PAIN LEG PAIN  EXAM: LUMBAR SPINE - COMPLETE 4+ VIEW  COMPARISON:  Lumbar MRI 10/25/2009   FINDINGS: Normal alignment of the lumbar vertebral bodies. There is mild endplate osteophytosis from L2 through L5 mildly progressedfrom comparison exam. Most pars fracture. No subluxation.  IMPRESSION: 1. No acute findings of the lumbar spine. 2. Mild disc osteophytic disease.   Electronically Signed   By: Suzy Bouchard M.D.   On: 04/10/2014 19:43     EKG Interpretation None      MDM   Final diagnoses:  Chronic pain   Ongoing bilateral leg pain after "walking too much" for the past several days. Patient has chronic back pain for which she takes Percocet. He's been out for the past 3 days. He denies any change with chronic pain pattern. No focal weakness, numbness or tingling. No bowel or bladder continence. No fever  or vomiting.  Urinalysis is negative. X-rays negative. He showed no focal weakness, numbness or tingling. No suspicion for cauda equina or compression. He states he has followup with his PCP on June 10. CK normal.   He states he is out of his Percocet for the past 3 days. Advised in the ER did not prescribe chronic pain medications. He has been going through his percocet more frequently than prescribed.  He will follow up with his PCP.   BP 140/83  Pulse 86  Temp(Src) 98.4 F (36.9 C) (Oral)  Resp 21  Ht 6' (1.829 m)  Wt 216 lb (97.977 kg)  BMI 29.29 kg/m2  SpO2 99%  I personally performed the services described in this documentation, which was scribed in my presence. The recorded information has been reviewed and is accurate.    Ezequiel Essex, MD 04/10/14 [phone]

## 2014-04-10 NOTE — Discharge Instructions (Signed)

## 2014-04-21 ENCOUNTER — Encounter: Payer: Self-pay | Admitting: Family Medicine

## 2014-04-21 ENCOUNTER — Ambulatory Visit (INDEPENDENT_AMBULATORY_CARE_PROVIDER_SITE_OTHER): Payer: Medicare Other | Admitting: Family Medicine

## 2014-04-21 VITALS — BP 143/83 | HR 67 | Temp 97.6°F | Wt 214.0 lb

## 2014-04-21 DIAGNOSIS — G8929 Other chronic pain: Secondary | ICD-10-CM

## 2014-04-21 DIAGNOSIS — Z720 Tobacco use: Secondary | ICD-10-CM

## 2014-04-21 DIAGNOSIS — F17200 Nicotine dependence, unspecified, uncomplicated: Secondary | ICD-10-CM | POA: Diagnosis not present

## 2014-04-21 MED ORDER — OXYCODONE-ACETAMINOPHEN 7.5-325 MG PO TABS: 1.0000 | ORAL_TABLET | ORAL | Status: DC | PRN

## 2014-04-21 MED ORDER — MELOXICAM 7.5 MG PO TABS: ORAL_TABLET | ORAL | Status: DC

## 2014-04-21 NOTE — Assessment & Plan Note (Addendum)
Stable pain today, out of Percocet. 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 Refilled Percocet 7.5 / 325 mg take 1 tab q 4 hr PRN (#140, 0 refills), x 3 printed rx for 3 month supply. Reduced amount from previous #180, goal of patient to take between 3-6 tabs daily, and not take max dose daily. 2. Continue Cymbalta 3. Reduced Mobic 15 to 7.5mg  daily, elevated Creatinine 1.30 (stable x 3 months), previously baseline 1 to 1.15. Continue to monitor, will plan to re-check BMET in 3 months, anticipate DC Mobic if Cr remains elevated at that time.

## 2014-04-21 NOTE — Assessment & Plan Note (Signed)
Active smoker with long smoking history. Currently smoking 0.5ppd, declines cessation assistance with NRT, medications, or counseling.

## 2014-04-21 NOTE — Progress Notes (Signed)
Patient ID: Todd Hayes, male   DOB: Jul 23, 1956, 58 y.o.   MRN: [id number]  Subjective:    HPI  CHRONIC PAIN SYNDROME / BACK PAIN: - Today patient reports back pain is stable. Admits to recent painful episode with gradual onset of "back hurting, and pain went down into both legs, knees, and ankles", following increased walking through outdoor trails. Episode lasted for about 4-5 days, since improved. - Last OV on 03/15/14, started on Cymbalta 60mg  daily. Compliant until recent worsening pain, self discontinued for few days thinking that it may have caused the pain. Since resumed Cymbalta, overall uncertain if improved pain control or not. - Reports recent ED visit 04/10/14 for above mentioned painful episode, lab work-up unremarkable, L-spine X-rays with no acute findings, noted known osteophytes / DJD. Admitted to being out of Percocet. - Requested refill on Mobic, still taking Trazodone 1 to 1.5 tabs nightly, Ropinerole - States out of Percocet, had been taking up to 6 tabs daily  Tobacco Abuse: - Continues to smoke < 0.5ppd, attempted E-cigs without success - Declines treatment with NRT, medications for smoking cessation  I have reviewed and updated the following as appropriate: allergies and current medications  Social Hx: Active smoker, 0.5ppd  Review of Systems See above HPI     Objective:   Physical Exam  BP 143/83  Pulse 67  Temp(Src) 97.6 F (36.4 C) (Oral)  Wt 214 lb (97.07 kg)  Gen - conversational, prefers to stand up during interview, well-appearing, NAD Heart - RRR, no murmurs  Lungs - CTAB. No wheezing or crackles. Normal work of breathing MSK - back non-tender to palpation, FROM T and L spine with forward flex / back extension. Seated slump test negative for radicular symptoms. Ext - no edema, non-tender, +2 peripheral pulses  Neuro - grossly non-focal, intact muscle str 5/5 bilateral ext. Distal sensation to light touch intact. Normal gait     Assessment:      See specific A&P problem list for details.      Plan:     See specific A&P problem list for details.

## 2014-04-21 NOTE — Patient Instructions (Signed)
Dear Mr. Reppert, Thank you for coming in to clinic today.  Today we discussed your Back Pain. 1. It sounds like you may have over exerted yourself with the walk through the woods. This is most likely the cause of your episode of pain. The X-rays are normal and consistent with your Arthritis 2. I have refilled your Percocet for 3 months. Try to take less on your good days, take no more than 6 daily. 3. Reduced dose of Mobic (now prescribed 7.5mg  tablets, instead of 15mg ) still take 1 tablet daily. We will need to re-check your blood work in the future, and may come off of this medicine completely then. 4. Continue to take Cymbalta 2 of the 30mg  tablets daily. 5. Call if you need refills.  Please schedule a follow-up appointment with me in 3 months for refills and re-evaluation.  If you have any other questions or concerns, please feel free to call the clinic to contact me. You may also schedule an earlier appointment if necessary.  However, if your symptoms get significantly worse, please go to the Emergency Department to seek immediate medical attention.  [name], [place]

## 2014-05-03 ENCOUNTER — Other Ambulatory Visit: Payer: Self-pay | Admitting: Family Medicine

## 2014-05-05 ENCOUNTER — Telehealth: Payer: Self-pay | Admitting: Family Medicine

## 2014-05-05 DIAGNOSIS — G8929 Other chronic pain: Secondary | ICD-10-CM

## 2014-05-05 NOTE — Telephone Encounter (Signed)
Patient states that oxycodone refill was cut from 180 tabs to 140 tabs. Patient is wanting to know why this was done. States 140 is not enough and he needs all of his pain meds. Please advise.

## 2014-05-06 MED ORDER — OXYCODONE-ACETAMINOPHEN 7.5-325 MG PO TABS: 1.0000 | ORAL_TABLET | ORAL | Status: DC | PRN

## 2014-05-06 NOTE — Telephone Encounter (Signed)
Called [facility], discussed current situation with Percocet rx. Last seen in clinic on 04/21/14 for chronic pain. At that time provided with 3 printed Percocet rx 7.5/325 mg take 1 tab q 4 hr PRN (#140, 0 refills), patient to take no more than 6 daily, and less than that on good days.  Patient reports that he is concerned about running out of current prescription before he is able to get a refill on his next prescription starting 05/12/14. He describes his pain as no different from usual chronic pain, except increased frequency of "bad days" with increased activity during the summer. He has been taking up to 8 daily on bad days, and mostly consistent with 6x daily. He was concerned about the recent decrease in number from #180 to #140 pills. We discussed that this was a goal to gradually lower the amount he was taking, and to only take it when needed, and take less on good days. He understands, and I agreed to provide a bridge prescription of Percocet (#30, 0 refill) for him to pick up tomorrow (Friday 05/07/14) at the front desk.  He plans to fill the other prescriptions according to their fill dates 05/12/14 and 06/12/14. Otherwise, he will schedule a follow-up appointment earlier than anticipated to discuss his chronic pain if needed.  Prescription printed and in envelope with patient's name on it for pick-up.  [name], [place], PGY-1

## 2014-06-08 ENCOUNTER — Telehealth: Payer: Self-pay | Admitting: Family Medicine

## 2014-06-20 DIAGNOSIS — Z79899 Other long term (current) drug therapy: Secondary | ICD-10-CM | POA: Diagnosis not present

## 2014-06-20 DIAGNOSIS — Z888 Allergy status to other drugs, medicaments and biological substances status: Secondary | ICD-10-CM | POA: Diagnosis not present

## 2014-06-20 DIAGNOSIS — F17200 Nicotine dependence, unspecified, uncomplicated: Secondary | ICD-10-CM | POA: Diagnosis not present

## 2014-06-20 DIAGNOSIS — I10 Essential (primary) hypertension: Secondary | ICD-10-CM | POA: Diagnosis not present

## 2014-06-20 DIAGNOSIS — G2581 Restless legs syndrome: Secondary | ICD-10-CM | POA: Diagnosis not present

## 2014-06-20 DIAGNOSIS — M25519 Pain in unspecified shoulder: Secondary | ICD-10-CM | POA: Diagnosis not present

## 2014-06-22 ENCOUNTER — Other Ambulatory Visit: Payer: Self-pay | Admitting: *Deleted

## 2014-06-22 DIAGNOSIS — M545 Low back pain, unspecified: Secondary | ICD-10-CM

## 2014-06-22 DIAGNOSIS — G8929 Other chronic pain: Secondary | ICD-10-CM

## 2014-06-22 MED ORDER — PREGABALIN 50 MG PO CAPS
50.0000 mg | ORAL_CAPSULE | Freq: Three times a day (TID) | ORAL | Status: DC
Start: 2014-06-22 — End: 2014-06-23

## 2014-06-23 ENCOUNTER — Encounter: Payer: Self-pay | Admitting: Family Medicine

## 2014-06-23 ENCOUNTER — Ambulatory Visit (INDEPENDENT_AMBULATORY_CARE_PROVIDER_SITE_OTHER): Payer: Medicare Other | Admitting: Family Medicine

## 2014-06-23 VITALS — BP 146/87 | HR 89 | Temp 98.1°F | Ht 73.0 in | Wt 205.0 lb

## 2014-06-23 DIAGNOSIS — S46819A Strain of other muscles, fascia and tendons at shoulder and upper arm level, unspecified arm, initial encounter: Secondary | ICD-10-CM

## 2014-06-23 DIAGNOSIS — Z5189 Encounter for other specified aftercare: Secondary | ICD-10-CM

## 2014-06-23 DIAGNOSIS — M545 Low back pain, unspecified: Secondary | ICD-10-CM | POA: Diagnosis not present

## 2014-06-23 DIAGNOSIS — G8929 Other chronic pain: Secondary | ICD-10-CM

## 2014-06-23 DIAGNOSIS — S43499A Other sprain of unspecified shoulder joint, initial encounter: Secondary | ICD-10-CM | POA: Diagnosis not present

## 2014-06-23 DIAGNOSIS — S46811D Strain of other muscles, fascia and tendons at shoulder and upper arm level, right arm, subsequent encounter: Secondary | ICD-10-CM

## 2014-06-23 DIAGNOSIS — M4716 Other spondylosis with myelopathy, lumbar region: Secondary | ICD-10-CM | POA: Insufficient documentation

## 2014-06-23 MED ORDER — OXYCODONE-ACETAMINOPHEN 7.5-325 MG PO TABS: 1.0000 | ORAL_TABLET | ORAL | Status: DC | PRN

## 2014-06-23 MED ORDER — METHYLPREDNISOLONE ACETATE 40 MG/ML IJ SUSP
40.0000 mg | Freq: Once | INTRAMUSCULAR | Status: AC
  Administered 2014-06-23: 40 mg via INTRAMUSCULAR

## 2014-06-23 MED ORDER — PREGABALIN 50 MG PO CAPS: 50.0000 mg | ORAL_CAPSULE | Freq: Three times a day (TID) | ORAL | Status: DC

## 2014-06-23 NOTE — Assessment & Plan Note (Addendum)
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 Refilled Percocet 7.5 / 325 mg take 1 tab q 4 hr PRN (#180, 0 refills), x 3 printed rx for 3 month supply, provide enough for max 6 tabs daily. 2. Continue Cymbalta 3. Hold Lyrica - concern with toe numbness, send refill incase patient feels like pain returns 4. Continue reduced Mobic to 7.5mg  daily, elevated Creatinine 1.30 last checked. Continue to monitor, will plan to re-check BMET in 3 months (09/2014), anticipate DC Mobic if Cr remains elevated at that time.

## 2014-06-23 NOTE — Progress Notes (Signed)
Subjective:     Patient ID: Todd Hayes, male   DOB: 1956/06/28, 58 y.o.   MRN: [id number]  Presents for a same day appointment.  HPI  PAIN / SHOT IN SHOULDER: - Reports right upper posterior shoulder blade pain with "tight muscles and a knot". Denies any accident or injury. History of similar pain before, about 1 year ago, received trigger point muscle injection and pain improved - Requesting injection today  CHRONIC PAIN SYNDROME / BACK PAIN:  - Complains of chronic pain, currently unchanged except R-shoulder - Tries to stay active while taking Percocet, increased daily walking - Taking up to 6 Percocets daily - Concerned that Lyrica is making "feet numb", however it is helping reduce shooting pains, plans to self discontinue for now and see if numbness improves - Denies any fevers/chills, saddle anesthesia, urinary retention or incontinence   I have reviewed and updated the following as appropriate: allergies and current medications  Social Hx: Active smoker  Review of Systems  See above HPI    Objective:   Physical Exam  BP 146/87  Pulse 89  Temp(Src) 98.1 F (36.7 C) (Oral)  Ht 6\' 1"  (1.854 m)  Wt 205 lb (92.987 kg)  BMI 27.05 kg/m2  Gen - well-appearing, NAD  Heart - RRR, no murmurs  Lungs - CTAB MSK - Right Posterior Shoulder Girdle / Medial Trapezius - palpable 1cm muscle knot with localized exquisite tenderness, surround trapezius hypertonicity and mild asymmetry vs left, R-shoulder mostly FROM forward flex / extension, limited internal rotation, rotator cuff strength intact. Ext - no edema, non-tender, +2 peripheral pulses  Neuro - awake, alert, grossly non-focal, intact muscle str 5/5 bilateral ext. Distal sensation to light touch intact. Normal gait  Trigger point injection  Consent was obtained. The trigger point was identified and confirmed by the patient. The spot was marked. Skin was cleaned with alcohol and betadine. 5cc of 1% lidocaine w/o epi and  methylprednisolone 40mg  was injected in a wheel fashion in the area. Procedure repeated for adjacent trigger point. Patient tolerated procedure well.     Assessment:     See specific A&P problem list for details.      Plan:     See specific A&P problem list for details.

## 2014-06-23 NOTE — Patient Instructions (Signed)
Dear Todd Hayes, Thank you for coming in to clinic today. It was good to see you!  Today we discussed your Chronic Pain and your Right Shoulder Pain. 1. Refilled your Percocet - take up to 6 tabs a day. Try to take smaller amounts on good days. 2. Recommend trial off of Lyrica to see if that helps your toe numbness. If pain returns, please start taking this again. 3. Performed Right Trapezius (Shoulder) injection for trigger point. This may cause some increased pain for the next 24 hours, but then it should start to feel better. If you do not get relief it may need to be repeated in a few weeks. We can also refer you to Sports Medicine for repeated injections if needed.  Please schedule a follow-up appointment with me (Dr. Parks Ranger) in 3 months for follow-up.  If you have any other questions or concerns, please feel free to call the clinic to contact me. You may also schedule an earlier appointment if necessary.  However, if your symptoms get significantly worse, please go to the Emergency Department to seek immediate medical attention.  [name], DO [facility]   Trigger Point Injection Trigger points are areas where you have muscle pain. A trigger point injection is a shot given in the trigger point to relieve that pain. A trigger point might feel like a knot in your muscle. It hurts to press on a trigger point. Sometimes the pain spreads out (radiates) to other parts of the body. For example, pressing on a trigger point in your shoulder might cause pain in your arm or neck. You might have one trigger point. Or, you might have more than one. People often have trigger points in their upper back and lower back. They also occur often in the neck and shoulders. Pain from a trigger point lasts for a long time. It can make it hard to keep moving. You might not be able to do the exercise or physical therapy that could help you deal with the pain. A trigger point  injection may help. It does not work for everyone. But, it may relieve your pain for a few days or a few months. A trigger point injection does not cure long-lasting (chronic) pain. LET YOUR CAREGIVER KNOW ABOUT:  Any allergies (especially to latex, lidocaine, or steroids).  Blood-thinning medicines that you take. These drugs can lead to bleeding or bruising after an injection. They include:  Aspirin.  Ibuprofen.  Clopidogrel.  Warfarin.  Other medicines you take. This includes all vitamins, herbs, eyedrops, over-the-counter medicines, and creams.  Use of steroids.  Recent infections.  Past problems with numbing medicines.  Bleeding problems.  Surgeries you have had.  Other health problems. RISKS AND COMPLICATIONS A trigger point injection is a safe treatment. However, problems may develop, such as:  Minor side effects usually go away in 1 to 2 days. These may include:  Soreness.  Bruising.  Stiffness.  More serious problems are rare. But, they may include:  Bleeding under the skin (hematoma).  Skin infection.  Breaking off of the needle under your skin.  Lung puncture.  The trigger point injection may not work for you. BEFORE THE PROCEDURE You may need to stop taking any medicine that thins your blood. This is to prevent bleeding and bruising. Usually these medicines are stopped several days before the injection. No other preparation is needed. PROCEDURE  A trigger point injection can be given in your caregiver's office or in a clinic.  Each injection takes 2 minutes or less.  Your caregiver will feel for trigger points. The caregiver may use a marker to circle the area for the injection.  The skin over the trigger point will be washed with a germ-killing (antiseptic) solution.  The caregiver pinches the spot for the injection.  Then, a very thin needle is used for the shot. You may feel pain or a twitching feeling when the needle enters the trigger  point.  A numbing solution may be injected into the trigger point. Sometimes a drug to keep down swelling, redness, and warmth (inflammation) is also injected.  Your caregiver moves the needle around the trigger zone until the tightness and twitching goes away.  After the injection, your caregiver may put gentle pressure over the injection site.  Then it is covered with a bandage. AFTER THE PROCEDURE  You can go right home after the injection.  The bandage can be taken off after a few hours.  You may feel sore and stiff for 1 to 2 days.  Go back to your regular activities slowly. Your caregiver may ask you to stretch your muscles. Do not do anything that takes extra energy for a few days.  Follow your caregiver's instructions to manage and treat other pain. Document Released: 10/18/2011 Document Revised: 02/23/2013 Document Reviewed: 10/18/2011 [employer] Patient Information 2015 [place], [place]. This information is not intended to replace advice given to you by your health care provider. Make sure you discuss any questions you have with your health care provider.

## 2014-06-23 NOTE — Assessment & Plan Note (Signed)
Consistent with recurrent Right trapezius trigger point - Improved with last injection 1 year ago 06/2013 - Shoulder intact with good ROM, some limited internal rotation, intact rotator cuff strength  Plan: 1. Performed trigger point injection x 2 locations R-trapezius. Advised may inc pain x 24-48 hrs 2. Continue conservative therapies, heating pad / ice PRN, pain medicine, active stretching 3. RTC if worsening 2-3 weeks

## 2014-06-25 ENCOUNTER — Other Ambulatory Visit: Payer: Self-pay | Admitting: Family Medicine

## 2014-06-28 ENCOUNTER — Ambulatory Visit: Payer: Medicare Other | Admitting: Family Medicine

## 2014-07-26 ENCOUNTER — Emergency Department (HOSPITAL_BASED_OUTPATIENT_CLINIC_OR_DEPARTMENT_OTHER)
Admission: EM | Admit: 2014-07-26 | Discharge: 2014-07-26 | Disposition: A | Discharge status: Home / Self Care | Payer: Medicare Other | Attending: Emergency Medicine | Admitting: Emergency Medicine

## 2014-07-26 ENCOUNTER — Encounter (HOSPITAL_BASED_OUTPATIENT_CLINIC_OR_DEPARTMENT_OTHER): Payer: Self-pay | Admitting: Emergency Medicine

## 2014-07-26 ENCOUNTER — Telehealth: Payer: Self-pay | Admitting: Family Medicine

## 2014-07-26 DIAGNOSIS — Z8639 Personal history of other endocrine, nutritional and metabolic disease: Secondary | ICD-10-CM | POA: Diagnosis not present

## 2014-07-26 DIAGNOSIS — Z791 Long term (current) use of non-steroidal anti-inflammatories (NSAID): Secondary | ICD-10-CM | POA: Diagnosis not present

## 2014-07-26 DIAGNOSIS — Z79899 Other long term (current) drug therapy: Secondary | ICD-10-CM | POA: Insufficient documentation

## 2014-07-26 DIAGNOSIS — Z760 Encounter for issue of repeat prescription: Secondary | ICD-10-CM | POA: Diagnosis not present

## 2014-07-26 DIAGNOSIS — K219 Gastro-esophageal reflux disease without esophagitis: Secondary | ICD-10-CM | POA: Insufficient documentation

## 2014-07-26 DIAGNOSIS — Z862 Personal history of diseases of the blood and blood-forming organs and certain disorders involving the immune mechanism: Secondary | ICD-10-CM | POA: Insufficient documentation

## 2014-07-26 DIAGNOSIS — I10 Essential (primary) hypertension: Secondary | ICD-10-CM | POA: Insufficient documentation

## 2014-07-26 DIAGNOSIS — G8929 Other chronic pain: Secondary | ICD-10-CM

## 2014-07-26 DIAGNOSIS — F17200 Nicotine dependence, unspecified, uncomplicated: Secondary | ICD-10-CM | POA: Diagnosis not present

## 2014-07-26 DIAGNOSIS — M549 Dorsalgia, unspecified: Secondary | ICD-10-CM | POA: Insufficient documentation

## 2014-07-26 MED ORDER — TRAMADOL HCL 50 MG PO TABS: 50.0000 mg | ORAL_TABLET | Freq: Three times a day (TID) | ORAL | Status: DC | PRN

## 2014-07-26 NOTE — Telephone Encounter (Signed)
Returned call to Todd Hayes to discuss concerns and request for Fentanyl patches. Patient last seen on 06/23/14 for OV for chronic pain, given 3 month supply (3 separate rx for Percocet 7.5/325mg  #180 0 refills) for total 3 month supply.  Patient reports that he has been "taking Percocet pills like candy" and has been using them more frequently, currently low or almost out with current prescription. He does admit to having last prescription unable to be filled until 08/12/14. He states he called to request the Fentanyl patch for pain since he is "out of medicine" and "wanted to try something else". Denied any new symptoms or injury. I explained that this would require an appointment, as we do not prescribe narcotics over the phone. Additionally, patient's typically would be on one or the other, and not the same dose of Percocet plus the Fentanyl Patch. I expressed my concern for his high doses of opioid pain medicine, and explained that as discussed before we may need to find alternative solutions, and explore other alternatives such as Pain Management (previously reported to being unable to return to a prior Pain Management practice). I recommended that he schedule a follow-up appointment to discuss this in person, and recommended that we could see him within 1 to 2 months for re-evaluation of chronic pain, but are unlikely able to provide further opioid medicines including Fentanyl patches at this time. Patient was not pleased with this response, but agreed to follow-up in future.  Todd Hayes, [place], PGY-2

## 2014-07-26 NOTE — ED Notes (Signed)
States he ran out of pain medication for chronic pain and his MD will not fill his pain med RX.

## 2014-07-26 NOTE — Telephone Encounter (Signed)
Pt called and would like to try the patches. Please call him. Blima Rich

## 2014-07-26 NOTE — ED Provider Notes (Signed)
CSN: [id number]     Arrival date & time 07/26/14  1645 History   First MD Initiated Contact with Patient 07/26/14 1736     Chief Complaint  Patient presents with  . Medication Refill     (Consider location/radiation/quality/duration/timing/severity/associated sxs/prior Treatment) HPI Comments: Todd Hayes is a 58 y.o. male with a PMHx of HTN, HLD, chronic pain, and GERD who presents to the ED with complaints of chronic pain and needing a medication refill which his PCP declined today. Pt states that he has chronic ongoing back and R shoulder pain, which has not changed, and he's being managed by Dr. Marlene Bast with percocet 7.5/325mg . States he ran out of his meds 2 days ago and needs a refill, called his PCP and they refused to fill it. States he has an appt on 08/13/14 to discuss alternative options such as a patch for pain. Reports taking 6-7 tabs of pain meds/day with minimal relief to his pain, and that he's been going through his rx quicker than usual. States nothing else has helped in the past, denies taking any other narcotics for pain. Current pain regimen is lyrica, mobic, and cymbalta along with percocet. Denies use of tramadol. Denies any current withdrawal symptoms, or any acute changes in his pain.   The history is provided by the patient. No language interpreter was used.    Past Medical History  Diagnosis Date  . Allergy   . Hyperlipidemia   . Hypertension   . Chronic pain   . Insomnia   . GERD (gastroesophageal reflux disease)    Past Surgical History  Procedure Laterality Date  . Shoulder surgery Right    No family history on file. History  Substance Use Topics  . Smoking status: Current Every Day Smoker -- 1.00 packs/day    Types: Cigarettes  . Smokeless tobacco: Not on file  . Alcohol Use: Yes    Review of Systems  Constitutional: Negative for fever and chills.  HENT: Negative for rhinorrhea.   Respiratory: Negative for shortness of breath.    Cardiovascular: Negative for chest pain.  Gastrointestinal: Negative for nausea, vomiting, abdominal pain and constipation.  Genitourinary: Negative for dysuria and hematuria.  Musculoskeletal: Positive for arthralgias (chronic R shoulder pain) and back pain (chronic). Negative for joint swelling, myalgias, neck pain and neck stiffness.  Skin: Negative for color change and rash.  Neurological: Negative for dizziness, syncope, weakness, light-headedness and numbness.  Psychiatric/Behavioral: Negative for confusion.  10 Systems reviewed and are negative for acute change except as noted in the HPI.     Allergies  Flexeril  Home Medications   Prior to Admission medications   Medication Sig Start Date End Date Taking? Authorizing Provider  DULoxetine (CYMBALTA) 30 MG capsule Take 2 capsules (60 mg total) by mouth daily. 03/15/14   [name], DO  fenofibrate micronized (LOFIBRA) 134 MG capsule Take 1 capsule (134 mg total) by mouth daily before breakfast. 05/12/12 05/12/13  Luetta Nutting, DO  lisinopril (PRINIVIL,ZESTRIL) 20 MG tablet Take 10 mg by mouth daily. 09/22/13   [name], DO  meloxicam (MOBIC) 7.5 MG tablet TAKE ONE TABLET BY MOUTH ONCE DAILY 04/21/14   [name], DO  oxyCODONE-acetaminophen (PERCOCET) 7.5-325 MG per tablet Take 1 tablet by mouth every 4 (four) hours as needed for pain (Max dose 6 tablets daily.). 08/12/14   [name], DO  pantoprazole (PROTONIX) 20 MG tablet Take 1 tablet (20 mg total) by mouth daily. 02/01/14   [name], DO  pregabalin (  LYRICA) 50 MG capsule Take 1 capsule (50 mg total) by mouth 3 (three) times daily. 06/23/14   [name], DO  rOPINIRole (REQUIP) 0.25 MG tablet TAKE FOUR TABLETS BY MOUTH ONCE DAILY AT BEDTIME AS NEEDED 05/03/14   [name], DO  traMADol (ULTRAM) 50 MG tablet Take 50 mg by mouth. 06/20/14   Historical Provider, MD  traMADol (ULTRAM) 50 MG tablet Take 1 tablet  (50 mg total) by mouth every 8 (eight) hours as needed for severe pain. 07/26/14   Teo Moede Strupp Camprubi-Soms, PA-C  traZODone (DESYREL) 150 MG tablet TAKE TWO TABLETS BY MOUTH AT BEDTIME 06/28/14   Alexander Parks Ranger, DO   BP 149/84  Pulse 100  Temp(Src) 98.2 F (36.8 C) (Oral)  Resp 16  Ht 6' (1.829 m)  Wt 205 lb (92.987 kg)  BMI 27.80 kg/m2  SpO2 98% Physical Exam  Nursing note and vitals reviewed. Constitutional: He is oriented to person, place, and time. Vital signs are normal. He appears well-developed and well-nourished.  Non-toxic appearance. No distress.  HENT:  Head: Normocephalic and atraumatic.  Mouth/Throat: Mucous membranes are normal.  Eyes: Conjunctivae and EOM are normal. Pupils are equal, round, and reactive to light. Right eye exhibits no discharge. Left eye exhibits no discharge.  Neck: Normal range of motion. Neck supple. No spinous process tenderness and no muscular tenderness present. No rigidity. Normal range of motion present.  FROM intact without spinous process or paraspinous muscle TTP, no bony stepoffs or deformities, no muscle spasms. No rigidity or meningeal signs. No bruising or swelling.  Cardiovascular: Normal rate and intact distal pulses.   Distal pulses intact  Pulmonary/Chest: Effort normal. No respiratory distress.  Abdominal: Normal appearance. He exhibits no distension.  Musculoskeletal:  Baseline ROM and strength and sensation in all extremities, gait WNL  Neurological: He is alert and oriented to person, place, and time. He has normal strength. No sensory deficit. Gait normal.  Skin: Skin is warm, dry and intact. No rash noted.  Psychiatric: His mood appears anxious.    ED Course  Procedures (including critical care time) Labs Review Labs Reviewed - No data to display  Imaging Review No results found.   EKG Interpretation None      MDM   Final diagnoses:  Chronic pain    58y/o male with chronic pain, told by his PCP Dr.  Marlene Bast that he could not have a refill on his oxycodone rx due to concerns of overuse. NCDB search reveals that percocet 7.5/325mg  #180 tabs filled 07/15/14 and 06/23/14. Tramadol 50mg  rx #12 tabs filled 06/21/14 although pt denies using this. Discussed that the amount of oxycodone use is too high and he needs to see his regular PCP for chronic pain management, as he has an appt on 10/2. Discussed that I would not be filling any oxycodone today, but would be willing to give a small supply of tramadol for pain to get him to his PCP appt in 2 weeks. Tramadol rx given. Pt not currently having any withdrawal symptoms, although discussed with him that this could occur. I explained the medical concerns and have given explicit precautions to return to the ER including for any other new or worsening symptoms. The patient understands and accepts the medical plan as it's been dictated and I have answered their questions. Discharge instructions concerning home care and prescriptions have been given. The patient is STABLE and is discharged to home in good condition.  BP 149/84  Pulse 100  Temp(Src) 98.2 F (36.8  C) (Oral)  Resp 16  Ht 6' (1.829 m)  Wt 205 lb (92.987 kg)  BMI 27.80 kg/m2  SpO2 98%  Meds ordered this encounter  Medications  . traMADol (ULTRAM) 50 MG tablet    Sig: Take 1 tablet (50 mg total) by mouth every 8 (eight) hours as needed for severe pain.    Dispense:  15 tablet    Refill:  0    Order Specific Question:  Supervising Provider    Answer:  Johnna Acosta [[id number]]       [place], PA-C 07/27/14 [phone]

## 2014-07-26 NOTE — Telephone Encounter (Signed)
Patient asking to try fentanyl patches for pain, will forward to PCP.

## 2014-07-26 NOTE — Discharge Instructions (Signed)
You will need to see your regular doctor for ongoing management of your chronic pain. You've been given a small supply of tramadol to help get you to your appointment, but use these cautiously and as directed. Return to the ER for changes or worsening symptoms, but understand that your oxycodone prescription might not be refilled given that the amount of pills you're using is high and could be dangerous to your health.    Chronic Pain Chronic pain can be defined as pain that is off and on and lasts for 3-6 months or longer. Many things cause chronic pain, which can make it difficult to make a diagnosis. There are many treatment options available for chronic pain. However, finding a treatment that works well for you may require trying various approaches until the right one is found. Many people benefit from a combination of two or more types of treatment to control their pain. SYMPTOMS  Chronic pain can occur anywhere in the body and can range from mild to very severe. Some types of chronic pain include:  Headache.  Low back pain.  Cancer pain.  Arthritis pain.  Neurogenic pain. This is pain resulting from damage to nerves. People with chronic pain may also have other symptoms such as:  Depression.  Anger.  Insomnia.  Anxiety. DIAGNOSIS  Your health care provider will help diagnose your condition over time. In many cases, the initial focus will be on excluding possible conditions that could be causing the pain. Depending on your symptoms, your health care provider may order tests to diagnose your condition. Some of these tests may include:   Blood tests.   CT scan.   MRI.   X-rays.   Ultrasounds.   Nerve conduction studies.  You may need to see a specialist.  TREATMENT  Finding treatment that works well may take time. You may be referred to a pain specialist. He or she may prescribe medicine or therapies, such as:   Mindful meditation or yoga.  Shots (injections)  of numbing or pain-relieving medicines into the spine or area of pain.  Local electrical stimulation.  Acupuncture.   Massage therapy.   Aroma, color, light, or sound therapy.   Biofeedback.   Working with a physical therapist to keep from getting stiff.   Regular, gentle exercise.   Cognitive or behavioral therapy.   Group support.  Sometimes, surgery may be recommended.  HOME CARE INSTRUCTIONS   Take all medicines as directed by your health care provider.   Lessen stress in your life by relaxing and doing things such as listening to calming music.   Exercise or be active as directed by your health care provider.   Eat a healthy diet and include things such as vegetables, fruits, fish, and lean meats in your diet.   Keep all follow-up appointments with your health care provider.   Attend a support group with others suffering from chronic pain. SEEK MEDICAL CARE IF:   Your pain gets worse.   You develop a new pain that was not there before.   You cannot tolerate medicines given to you by your health care provider.   You have new symptoms since your last visit with your health care provider.  SEEK IMMEDIATE MEDICAL CARE IF:   You feel weak.   You have decreased sensation or numbness.   You lose control of bowel or bladder function.   Your pain suddenly gets much worse.   You develop shaking.  You develop chills.  You develop  confusion.  You develop chest pain.  You develop shortness of breath.  MAKE SURE YOU:  Understand these instructions.  Will watch your condition.  Will get help right away if you are not doing well or get worse. Document Released: 07/21/2002 Document Revised: 07/01/2013 Document Reviewed: 04/24/2013 [facility] Patient Information 2015 [place], [place]. This information is not intended to replace advice given to you by your health care provider. Make sure you discuss any questions you have with your health care  provider.

## 2014-07-27 NOTE — ED Provider Notes (Signed)
Medical screening examination/treatment/procedure(s) were performed by non-physician practitioner and as supervising physician I was immediately available for consultation/collaboration.   EKG Interpretation None       Threasa Beards, MD 07/27/14 [phone]

## 2014-07-28 ENCOUNTER — Other Ambulatory Visit: Payer: Self-pay | Admitting: Family Medicine

## 2014-08-23 ENCOUNTER — Other Ambulatory Visit: Payer: Self-pay | Admitting: Family Medicine

## 2014-09-08 ENCOUNTER — Encounter: Payer: Self-pay | Admitting: Family Medicine

## 2014-09-08 ENCOUNTER — Ambulatory Visit (INDEPENDENT_AMBULATORY_CARE_PROVIDER_SITE_OTHER): Payer: Medicare Other | Admitting: Family Medicine

## 2014-09-08 VITALS — BP 116/81 | HR 89 | Temp 98.1°F | Wt 201.7 lb

## 2014-09-08 DIAGNOSIS — R208 Other disturbances of skin sensation: Secondary | ICD-10-CM | POA: Diagnosis not present

## 2014-09-08 DIAGNOSIS — R200 Anesthesia of skin: Secondary | ICD-10-CM

## 2014-09-08 DIAGNOSIS — G8929 Other chronic pain: Secondary | ICD-10-CM

## 2014-09-08 DIAGNOSIS — G629 Polyneuropathy, unspecified: Secondary | ICD-10-CM | POA: Insufficient documentation

## 2014-09-08 MED ORDER — OXYCODONE-ACETAMINOPHEN 7.5-325 MG PO TABS: 1.0000 | ORAL_TABLET | ORAL | Status: DC | PRN

## 2014-09-08 MED ORDER — GABAPENTIN 100 MG PO CAPS: 100.0000 mg | ORAL_CAPSULE | Freq: Three times a day (TID) | ORAL | Status: DC

## 2014-09-08 MED ORDER — DULOXETINE HCL 30 MG PO CPEP: 60.0000 mg | ORAL_CAPSULE | Freq: Every day | ORAL | Status: DC

## 2014-09-08 NOTE — Assessment & Plan Note (Addendum)
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 Refilled Percocet 7.5 / 325 mg take 1 tab q 4 hr PRN (#180, 0 refills), x 3 printed rx for 3 month supply, provide enough for max 6 tabs daily. 2. Continue Cymbalta 3. Start trial Gabapentin low dose 100mg  capsules qhs (may titrate up to BID then gradually TID if tolerated), discontinued Lyrica (despite improving bilateral peripheral neuropathy, pt convinced this was making his feet more numb, despite no improvement after discontinuing) 4. Continue Mobic 7.5mg  daily, consider repeat BMET in future for monitoring Cr

## 2014-09-08 NOTE — Assessment & Plan Note (Addendum)
Seems consistent with peripheral neuropathy, associated with numbness and pain in bilateral feet. Otherwise unclear etiology - prior documentation of EMG however unable to locate report or records - Previously pain improved on Lyrica (self discontinued due to concern of worsening numbness)  Plan: 1. Start trial Gabapentin low dose 100mg  capsules qhs (may titrate up to BID then gradually TID if tolerated) 2. Consider repeat EMG / NCS in future to re-evaluate peripheral neuropathy

## 2014-09-08 NOTE — Progress Notes (Signed)
Patient ID: Todd Hayes, male   DOB: [birth year], 57 y.o.   MRN: [id number] Subjective:    HPI  CHRONIC PAIN SYNDROME / BACK PAIN:  - Complains of chronic pain without significant change in pain, mostly LBP currently 6/10 aching pain - Continues to take Percocet as prescribed, up to 6 tablets daily, admits to going to ED after last visit in August due to worsening pain. - Remains active, daily walking, ADLs - Denies any fevers/chills, falls or injury, saddle anesthesia, urinary retention or incontinence   BILATERAL TOE PAIN / NUMBNESS: - Chronic complaint for months to years, unable to say - States that after last OV 06/2014 he discontinued Lyrica due to concerns that it was making "the numbness worse" despite reducing the sharp shooting pains in feet. Since discontinuing he has not noticed any improvement in numbness. Regarding, Gabapentin, states he previously tried this medicine years ago, and thinks it may have "given him the jerks" so he stopped taking it, but is willing to try this again at a low dose - Left worse than Right, has been present without any improvement or worsening - Denies any redness, swelling, calf pain or swelling  I have reviewed and updated the following as appropriate: allergies and current medications  Social Hx: Active smoker  Review of Systems  See above HPI    Objective:   Physical Exam  BP 116/81  Pulse 89  Temp(Src) 98.1 F (36.7 C) (Oral)  Wt 201 lb 11.2 oz (91.491 kg)  Gen - well-appearing, NAD  Heart - RRR, no murmurs  Lungs - CTAB, normal work of breathing MSK - Back - no significant tenderness over spinous processes, full active ROM, negative seated SLR Ext - no edema, non-tender, +2 peripheral pulses  Neuro - awake, alert, grossly non-focal, intact muscle str 5/5 bilateral ext. Bilateral feet with distal sensation to light touch and intact to monofilament testing (without any deformities or sensory deficits). Normal gait     Assessment:      See specific A&P problem list for details.      Plan:     See specific A&P problem list for details.

## 2014-09-08 NOTE — Patient Instructions (Addendum)
Dear Ollen Gross Gutman, Thank you for coming in to clinic today.  1. For your toe numbness, start Gabapentin 100mg  capsules - take 1 at bedtime for 1 week, then if tolerated you may take 1 capsule twice daily, and eventually up to 1 capsule 3 times daily. Stop taking Lyrica. 2. Refilled Percocet for 3 months 3. Continue Cymbalta 4. If toe numbness continues, we can refer you for the Nerve Conduction Study  Recommend Colonoscopy for screening.  Recommend influenza vaccine.  Please schedule a follow-up appointment with Dr. Parks Ranger in 3 months to re-evaluate pain and numbness.  If you have any other questions or concerns, please feel free to call the clinic to contact me. You may also schedule an earlier appointment if necessary.  However, if your symptoms get significantly worse, please go to the Emergency Department to seek immediate medical attention.  [name], [place]

## 2014-10-19 ENCOUNTER — Other Ambulatory Visit: Payer: Self-pay | Admitting: Family Medicine

## 2014-11-09 ENCOUNTER — Ambulatory Visit (INDEPENDENT_AMBULATORY_CARE_PROVIDER_SITE_OTHER): Payer: Medicare Other | Admitting: Family Medicine

## 2014-11-09 ENCOUNTER — Encounter: Payer: Self-pay | Admitting: Family Medicine

## 2014-11-09 VITALS — BP 156/91 | HR 82 | Temp 98.4°F | Ht 72.0 in | Wt 201.0 lb

## 2014-11-09 DIAGNOSIS — Z7189 Other specified counseling: Secondary | ICD-10-CM

## 2014-11-09 DIAGNOSIS — K219 Gastro-esophageal reflux disease without esophagitis: Secondary | ICD-10-CM

## 2014-11-09 DIAGNOSIS — G8929 Other chronic pain: Secondary | ICD-10-CM | POA: Diagnosis not present

## 2014-11-09 DIAGNOSIS — R200 Anesthesia of skin: Secondary | ICD-10-CM

## 2014-11-09 DIAGNOSIS — R208 Other disturbances of skin sensation: Secondary | ICD-10-CM | POA: Diagnosis not present

## 2014-11-09 MED ORDER — OXYCODONE-ACETAMINOPHEN 7.5-325 MG PO TABS: 1.0000 | ORAL_TABLET | ORAL | Status: DC | PRN

## 2014-11-09 MED ORDER — PANTOPRAZOLE SODIUM 20 MG PO TBEC: 20.0000 mg | DELAYED_RELEASE_TABLET | Freq: Every day | ORAL | Status: DC

## 2014-11-09 NOTE — Patient Instructions (Signed)
Dear Ollen Gross Kaylor, Thank you for coming in to clinic today.  1. Continue Gabapentin, Cymbalta, and Trazodone. Discontinued Ropinerole 2. Refilled Percocet for 3 months 3. Continue Cymbalta  Recommend Colonoscopy for screening.  Please schedule a follow-up appointment with Dr. Parks Ranger in 3 months for refills.  If you have any other questions or concerns, please feel free to call the clinic to contact me. You may also schedule an earlier appointment if necessary.  However, if your symptoms get significantly worse, please go to the Emergency Department to seek immediate medical attention.  [name], [place]

## 2014-11-09 NOTE — Progress Notes (Signed)
Patient ID: Todd Hayes, male   DOB: [birth year], 57 y.o.   MRN: [id number] Subjective:    HPI  CHRONIC PAIN SYNDROME / LOW BACK PAIN:  - Complains of chronic LBP without significant change, described as mid to lower back 6-7/10, "aching" pain without radiation down legs. - Continues to take Percocet as prescribed, up to 6 tablets daily, occasionally has run out few days early when has flare-ups. Otherwise tolerating and admits to good compliance with meds - Continues to take Cymbalta 30mg  daily, Trazodone 150mg  qhs and repeat dose 150mg  if wakes up for sleep - Remains active, daily walking (every day up to 30 mins, mostly outside) - Denies any fevers/chills, falls or injury, saddle anesthesia, urinary retention or incontinence  [facility] Chronic Pain Management: Indication for chronic opioid: Lumbar DJD with foraminal stenosis Medication and dose: Percocet 7.5/325 q 4 hr PRN pain, max dose 6 in 24 hours # pills per month: 180, x 3 separate refills printed with do not fill dates for 3 month supply Last UDS date: 06/2013 (ordered today, 11/09/14) Pain contract signed (Y/N): Y (05/2013) - reviewed copy with patient today (11/09/14) Date narcotic database last reviewed (include red flags): 11/09/14   BILATERAL TOE PAIN / NUMBNESS: - Chronic complaint without significant change - Last OV 08/2014 resumed Gabapentin (gradual titration) previously self-discontinued Lyrica due to concern for making numbness worse despite improving shooting pains. Continues to be concerned with medications "giving the jerks in legs". Believes that Ropinerole is causing this side-effect, self-discontinued this. (Left > Right) - Admits to worst shooting pains about 3 weeks ago, occurs about 1x monthly, no daily pain, persistent numbing - Denies any redness, swelling, calf pain or swelling  HM: - Due for TDap (declined today)  I have reviewed and updated the following as appropriate: allergies and current  medications  Social Hx: Active smoker  Review of Systems  See above HPI    Objective:   Physical Exam  BP 156/91 mmHg  Pulse 82  Temp(Src) 98.4 F (36.9 C) (Oral)  Ht 6' (1.829 m)  Wt 201 lb (91.173 kg)  BMI 27.25 kg/m2  Gen - well-appearing, conversational, NAD  Lungs - CTAB, normal work of breathing MSK - Back - no significant tenderness over spinous processes, full active ROM, negative seated SLR w/o radicular symptoms Ext - no edema, non-tender, +2 peripheral pulses  Neuro - awake, alert, grossly non-focal, intact muscle str 5/5 bilateral ext. Bilateral feet with distal sensation to light touch, normal gait     Assessment & Plan:     See specific A&P problem list for details.

## 2014-11-10 DIAGNOSIS — G8929 Other chronic pain: Secondary | ICD-10-CM | POA: Insufficient documentation

## 2014-11-10 LAB — DRUG SCR UR, PAIN MGMT, REFLEX CONF
BARBITURATE QUANT UR: NEGATIVE
Benzodiazepines.: NEGATIVE
CREATININE, U: 189.57 mg/dL
Cocaine Metabolites: NEGATIVE
Methadone: NEGATIVE
Phencyclidine (PCP): NEGATIVE
Propoxyphene: NEGATIVE

## 2014-11-10 NOTE — Assessment & Plan Note (Signed)
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 Persistent subjective toe numbness  Plan: 1. Ordered UDS per [facility] Pain Management Protocol 2. Reviewed Pain Contract (signed 05/2013) - pt with literacy concerns, read aloud and summarized contract 3. Continue Cymbalta 30mg  daily 4. Continue Gabapentin - seems to be helping peripheral neuropathy 5. Continue Mobic (monitor BMET in future for Cr) 6. RTC 3 months - chronic pain management

## 2014-11-10 NOTE — Assessment & Plan Note (Signed)
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 Persistent subjective toe numbness  Plan: 1. Ordered UDS per [facility] Pain Management Protocol 2. Reviewed Pain Contract (signed 05/2013) - pt with literacy concerns, read aloud and summarized contract 3. Continue Cymbalta 30mg  daily 4. Continue Gabapentin - seems to be helping peripheral neuropathy 5. Continue Mobic (monitor BMET in future for Cr) 6. RTC 3 months - chronic pain management

## 2014-11-10 NOTE — Assessment & Plan Note (Signed)
Mild improvement, chronic, prior dx polyneuropathy with EMG (unable to locate record)  Plan: 1. Continue titration Gabapentin 2. Consider repeat EMG / NCS in future to re-evaluation prior dx polyneuropathy

## 2014-11-13 LAB — OPIATES/OPIOIDS (LC/MS-MS)
CODEINE URINE: NEGATIVE ng/mL (ref <50)
Hydrocodone: NEGATIVE ng/mL (ref <50)
Hydromorphone: NEGATIVE ng/mL (ref <50)
Morphine Urine: NEGATIVE ng/mL (ref <50)
NORHYDROCODONE, UR: NEGATIVE ng/mL (ref <50)
Noroxycodone, Ur: 1427 ng/mL — ABNORMAL HIGH (ref <50)
OXYMORPHONE, URINE: 539 ng/mL — AB (ref <50)
Oxycodone, ur: 435 ng/mL — ABNORMAL HIGH (ref <50)

## 2014-11-13 LAB — AMPHETAMINES (GC/LC/MS), URINE
Amphetamine GC/MS Conf: NEGATIVE ng/mL (ref <250)
Methamphetamine Quant, Ur: NEGATIVE ng/mL (ref <250)

## 2014-11-13 LAB — CANNABANOIDS (GC/LC/MS), URINE: THC-COOH UR CONFIRM: 104 ng/mL — AB (ref <5)

## 2014-11-17 ENCOUNTER — Other Ambulatory Visit: Payer: Self-pay | Admitting: Family Medicine

## 2014-11-17 DIAGNOSIS — G4700 Insomnia, unspecified: Secondary | ICD-10-CM

## 2014-11-18 ENCOUNTER — Other Ambulatory Visit: Payer: Self-pay | Admitting: Family Medicine

## 2014-12-01 IMAGING — CR DG SHOULDER 2+V*R*
4 series · 4 of 4 positions shown · non-contrast
Comparison: None

CLINICAL DATA: Right shoulder pain post fall, history of right
shoulder surgery in 2999 due to bone spurs, prior jackhammer use

RIGHT SHOULDER - 2+ VIEW

[w shoulder external right]
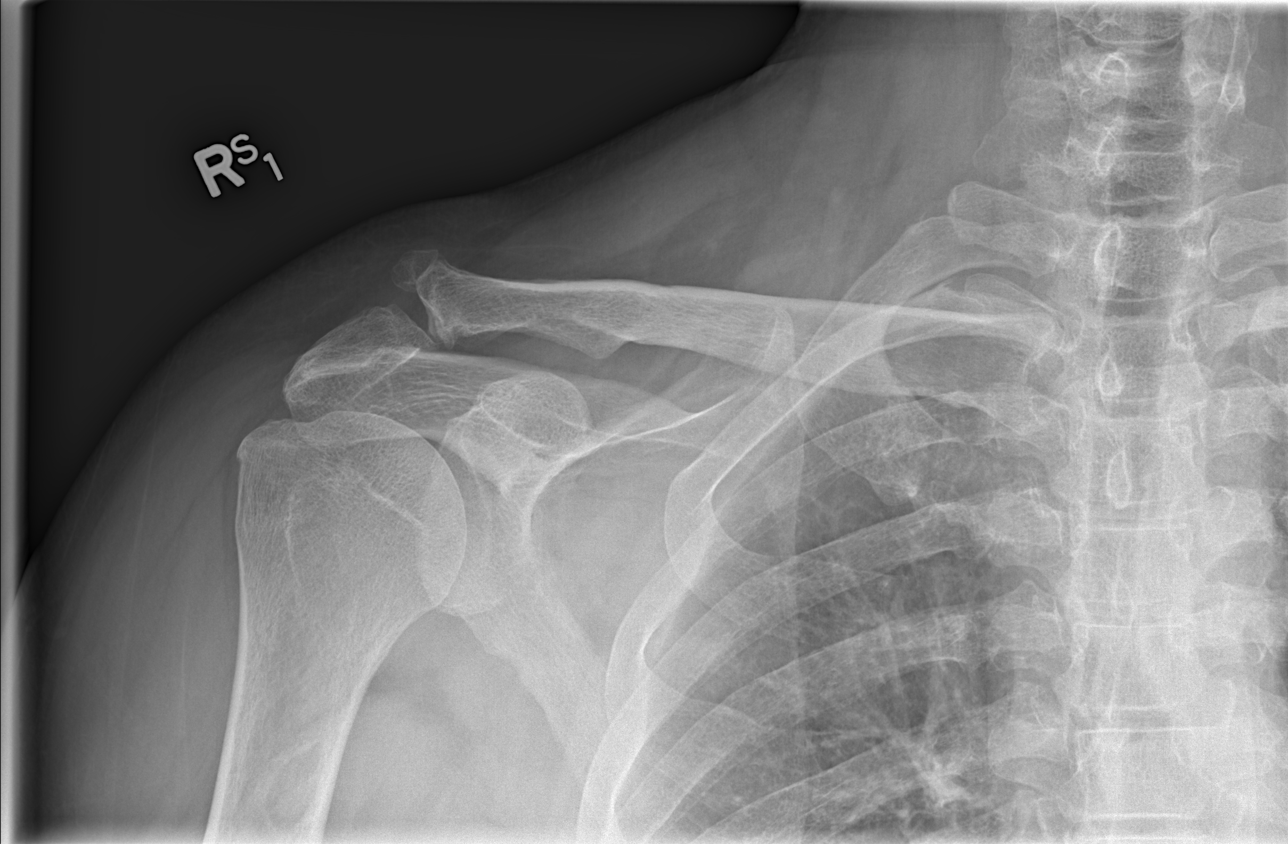

[w shoulder internal right]
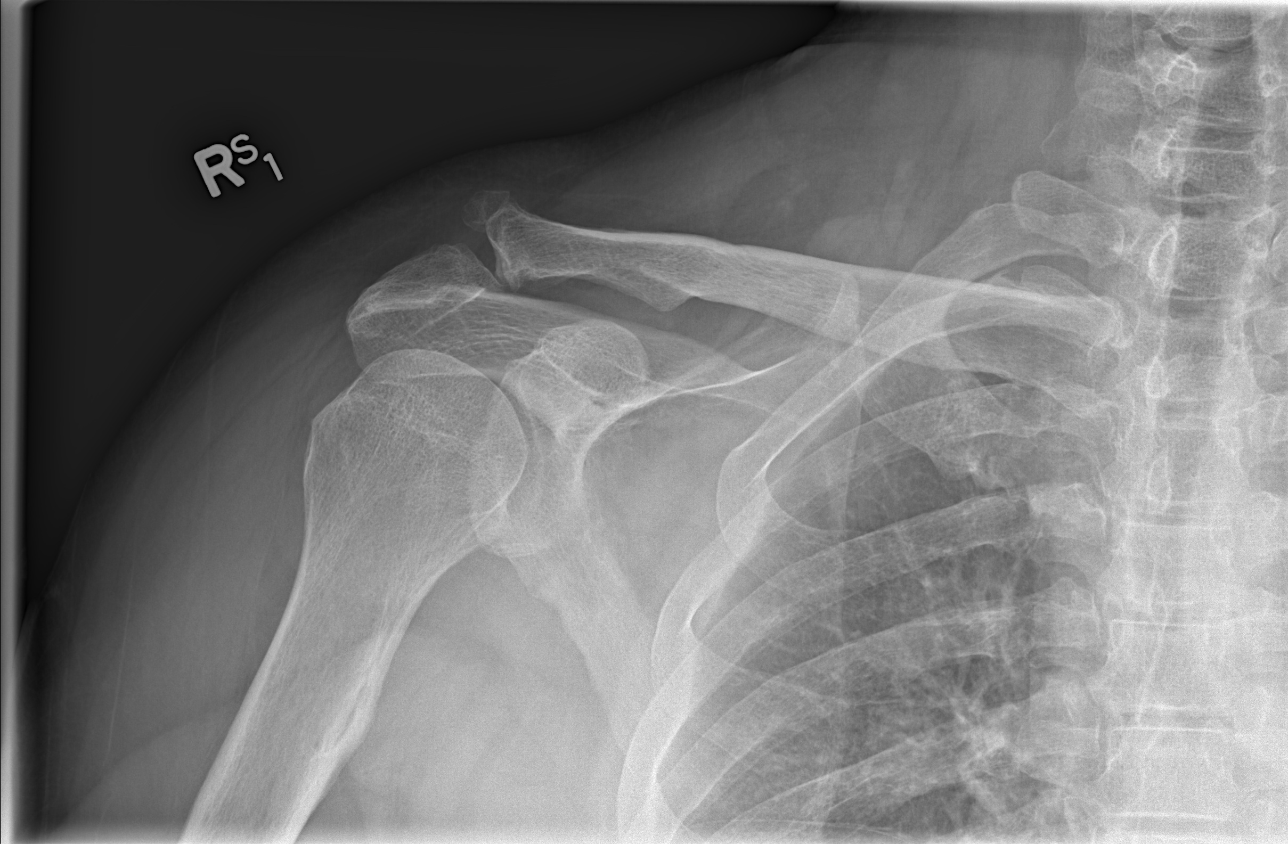

[w shoulder y-view right]
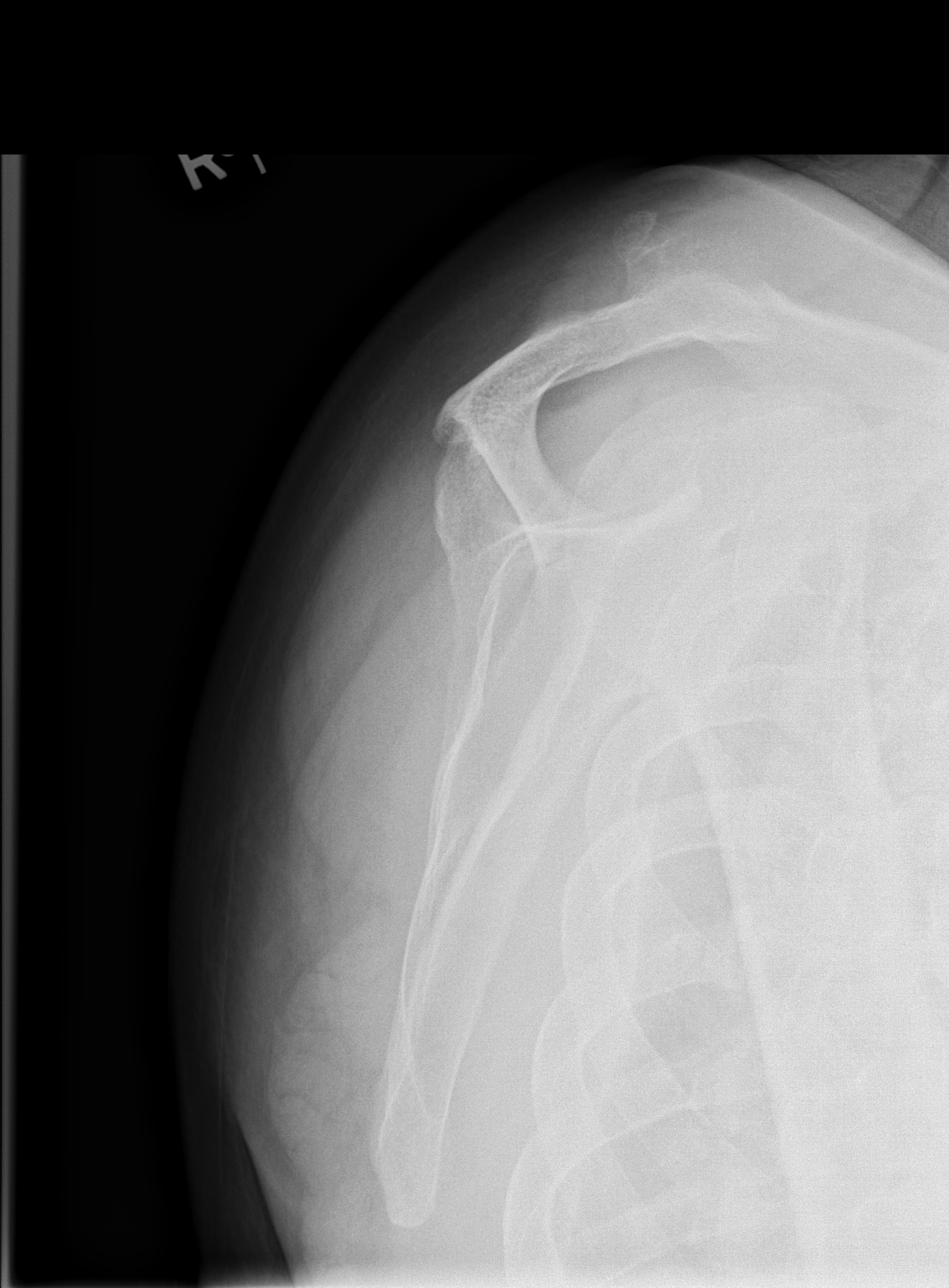

[w shoulder axillary right]
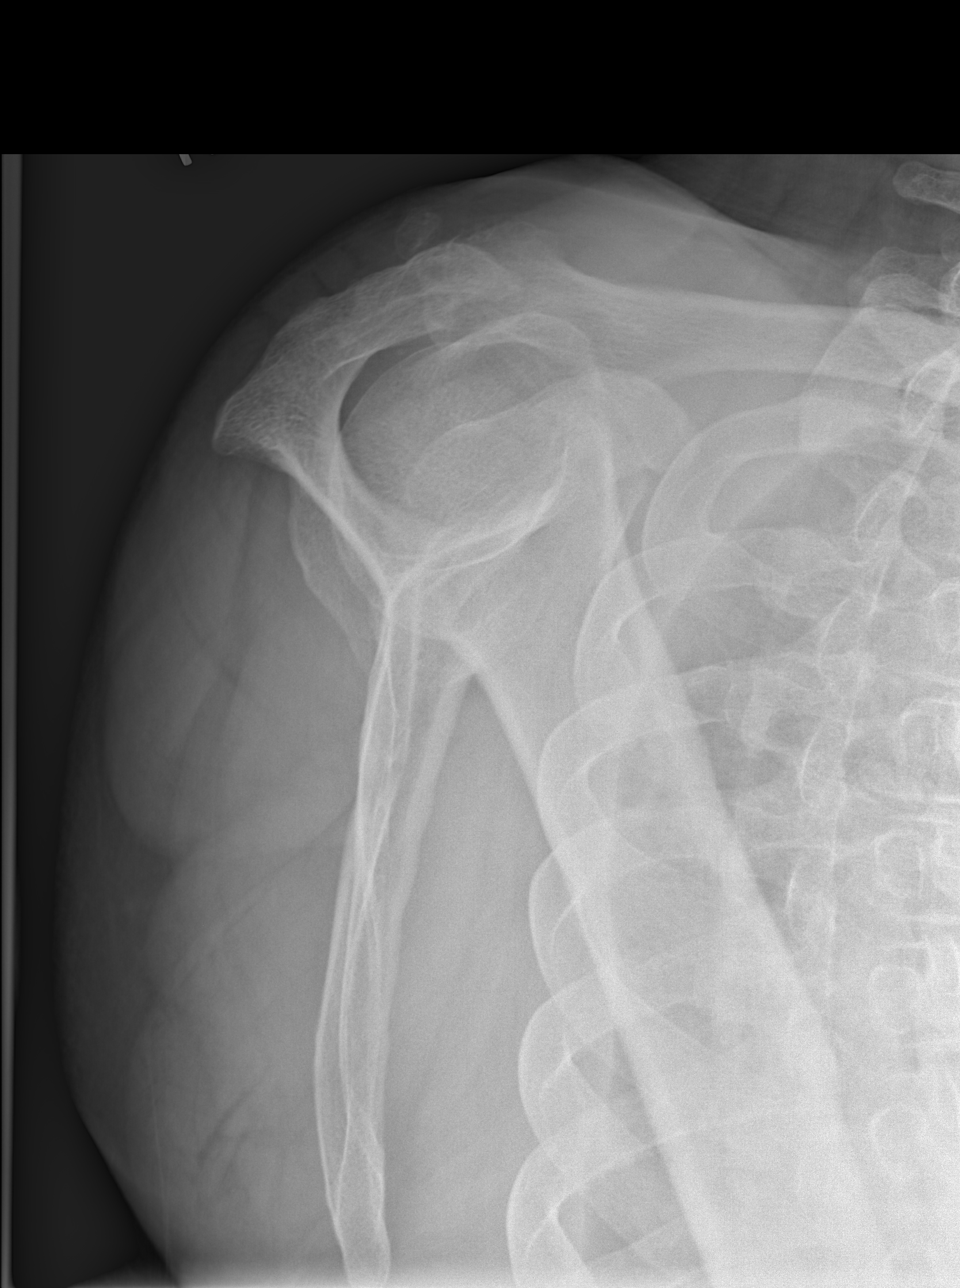

[4 of 4 positions shown; findings below may reference images not displayed]

FINDINGS: Osseous demineralization.
Degenerative changes right AC joint with spur versus nonfused
ossicle at cranial margin of the AC joint.
No acute fracture, dislocation or bone destruction.
Visualized right ribs intact.
IMPRESSION: AC joint degenerative changes as above.
No acute abnormalities.

## 2014-12-13 ENCOUNTER — Telehealth: Payer: Self-pay | Admitting: Family Medicine

## 2014-12-13 DIAGNOSIS — G2581 Restless legs syndrome: Secondary | ICD-10-CM

## 2014-12-13 NOTE — Telephone Encounter (Signed)
Needs refill on ropinirole

## 2014-12-14 MED ORDER — ROPINIROLE HCL 0.25 MG PO TABS: 1.0000 mg | ORAL_TABLET | Freq: Every day | ORAL | Status: DC

## 2014-12-14 NOTE — Telephone Encounter (Signed)
Recently patient had stated he self discontinued Ropinirole. However, will agree with refill if plans to continue, discuss at next f/u.  [name], [place], PGY-2

## 2015-01-06 ENCOUNTER — Ambulatory Visit (INDEPENDENT_AMBULATORY_CARE_PROVIDER_SITE_OTHER): Payer: Medicare Other | Admitting: Family Medicine

## 2015-01-06 ENCOUNTER — Encounter: Payer: Self-pay | Admitting: Family Medicine

## 2015-01-06 VITALS — BP 146/81 | HR 90 | Temp 98.1°F | Ht 72.0 in | Wt 196.0 lb

## 2015-01-06 DIAGNOSIS — G8929 Other chronic pain: Secondary | ICD-10-CM | POA: Diagnosis not present

## 2015-01-06 DIAGNOSIS — I10 Essential (primary) hypertension: Secondary | ICD-10-CM

## 2015-01-06 DIAGNOSIS — M4716 Other spondylosis with myelopathy, lumbar region: Secondary | ICD-10-CM | POA: Diagnosis not present

## 2015-01-06 DIAGNOSIS — Z7189 Other specified counseling: Secondary | ICD-10-CM | POA: Diagnosis not present

## 2015-01-06 DIAGNOSIS — R208 Other disturbances of skin sensation: Secondary | ICD-10-CM | POA: Diagnosis not present

## 2015-01-06 DIAGNOSIS — R200 Anesthesia of skin: Secondary | ICD-10-CM

## 2015-01-06 MED ORDER — OXYCODONE-ACETAMINOPHEN 7.5-325 MG PO TABS: 1.0000 | ORAL_TABLET | ORAL | Status: DC | PRN

## 2015-01-06 MED ORDER — BACLOFEN 10 MG PO TABS: 5.0000 mg | ORAL_TABLET | Freq: Three times a day (TID) | ORAL | Status: DC

## 2015-01-06 NOTE — Progress Notes (Signed)
Patient ID: Todd Hayes, male   DOB: 03-20-1956, 59 y.o.   MRN: [id number] Subjective:    HPI  CHRONIC PAIN SYNDROME / LOW BACK PAIN:  - Complains of chronic LBP, mostly stable since last visit, described as mostly lower back 6-7/10 with aching pain and stiffness that is worse after prolonged sitting. Today reports some occasional worsening low back pain today with pain over lumbar spine L4-L - Denies significant radiating pain down either leg, some history in past but not consistent - Continues to take Percocet as prescribed, max 6 tabs daily, not quite run out today - Continues Cymbalta 30mg  daily, tolerating - Continues Trazodone 150mg  qhs help with sleep in setting of chronic pain, occasional takes 300mg  - Occasional heating pad use with some relief - Walks daily up to 30 min - Denies any fevers/chills, falls or injury, saddle anesthesia, urinary retention or incontinence  BILATERAL TOE PAIN / NUMBNESS: - Chronic complaint without significant change, bilateral toe numbness and intermittent pain, L >R - Unable to tolerate Ropinerole due to "muscle jerks in arms", described as same complaint or side-effect he has had to multiple medications in past. Previously tolerated without problems - No longer compliant with Gabapentin therapy, unclear if not effective vs intolerant. Previously even on Lyrica but did not tolerate - Denies any redness, swelling, calf pain or swelling  CHRONIC HTN: Reports no complaints Current Meds - Lisinopril 20mg    Reports good compliance, took meds today. Tolerating well, w/o complaints. Lifestyle - walking daily Denies CP, dyspnea, HA, edema, dizziness / lightheadedness  I have reviewed and updated the following as appropriate: allergies and current medications  Social Hx: Active smoker, not interested in quitting  Review of Systems  See above HPI    Objective:   Physical Exam  BP 146/81 mmHg  Pulse 90  Temp(Src) 98.1 F (36.7 C) (Oral)  Ht 6'  (1.829 m)  Wt 196 lb (88.905 kg)  BMI 26.58 kg/m2  Gen - well-appearing, NAD Lungs - CTAB, normal work of breathing MSK - Back - mild +TTP localized over L4-L5 spinous processes, full active ROM forward flex, backward bending, negative seated SLR w/o radicular pain Ext - no edema, non-tender, +2 peripheral pulses  Neuro - awake, alert, grossly non-focal, intact muscle str 5/5 bilateral ext. Intact distal sensation to light touch. Normal gait     Assessment & Plan:     See specific A&P problem list for details.

## 2015-01-06 NOTE — Assessment & Plan Note (Signed)
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 Persistent subjective toe numbness and pain - No red flags. No persistent radicular symptoms, negative Seated SLR - Reviewed last UDS 12/29 - positive THC, appropriate opiate metabolites - Last Pain Contract (signed 05/2013), reviewed 11/09/14  Plan: 1. Start Baclofen today - 10mg  TID, start half tabs 5mg  TID - for pain / muscle spasms 2. Cont Cymbalta 30mg  daily 3. Cont Mobic - re-check BMET at next visit 4. Self discontinued Gabapentin 5. RTC 3 months - chronic pain management

## 2015-01-06 NOTE — Assessment & Plan Note (Signed)
Near goal BP today  Plan: 1. Continue current BP meds, unclear if taking 20mg  daily vs previously taking half 10mg  daily 2. Check BMET at next visit 3. Smoking cessation, not ready to quit

## 2015-01-06 NOTE — Assessment & Plan Note (Signed)
Not tolerating gabapentin due to "muscle jerks". Failed Lyrica in past - Monitor - Consider repeat EMG in future, prior chart review with EMG dx polyneuropathy

## 2015-01-06 NOTE — Assessment & Plan Note (Signed)
See details under "Chronic Pain" - Additionally, if future worsening radicular symptoms with leg pain, consider referral to Ortho for possible epidural steroid injections

## 2015-01-06 NOTE — Patient Instructions (Signed)
Dear Todd Hayes, Thank you for coming in to clinic today.  1. Started new medication - muscle relaxant - Baclofen - 10mg  tablets take 3 times daily - start with cutting tablets in half and take half a tablet 3 times daily, after 3 to 5 days 2. Continue Duloxtine (Cymbalta) and Trazodone. STOP TAKING Gabapentin, Ropinerole. 3. Refilled Percocet for 3 months   Recommend Colonoscopy for screening.  If your pain is getting worse and involving your legs, then we will discuss referral to Orthopedics for spinal epidural injections.  Please schedule a follow-up appointment with Dr. Parks Ranger in 3 months for refills  If you have any other questions or concerns, please feel free to call the clinic to contact me. You may also schedule an earlier appointment if necessary.  However, if your symptoms get significantly worse, please go to the Emergency Department to seek immediate medical attention.  [name], [place]

## 2015-01-06 NOTE — Assessment & Plan Note (Signed)
See details in overview - Refilled Percocet x 3 months - Last UDS, Pain Contract 11/09/14

## 2015-02-15 ENCOUNTER — Other Ambulatory Visit: Payer: Self-pay | Admitting: *Deleted

## 2015-02-15 DIAGNOSIS — I10 Essential (primary) hypertension: Secondary | ICD-10-CM

## 2015-02-15 MED ORDER — LISINOPRIL 20 MG PO TABS: 10.0000 mg | ORAL_TABLET | Freq: Every day | ORAL | Status: DC

## 2015-02-22 ENCOUNTER — Other Ambulatory Visit: Payer: Self-pay | Admitting: Family Medicine

## 2015-03-01 ENCOUNTER — Other Ambulatory Visit: Payer: Self-pay | Admitting: Family Medicine

## 2015-03-01 DIAGNOSIS — G4700 Insomnia, unspecified: Secondary | ICD-10-CM

## 2015-03-07 ENCOUNTER — Other Ambulatory Visit: Payer: Self-pay | Admitting: Family Medicine

## 2015-03-09 ENCOUNTER — Ambulatory Visit (INDEPENDENT_AMBULATORY_CARE_PROVIDER_SITE_OTHER): Payer: Medicare Other | Admitting: Family Medicine

## 2015-03-09 ENCOUNTER — Encounter: Payer: Self-pay | Admitting: Family Medicine

## 2015-03-09 VITALS — BP 108/74 | HR 83 | Temp 98.5°F | Ht 72.0 in | Wt 195.0 lb

## 2015-03-09 DIAGNOSIS — Z9889 Other specified postprocedural states: Secondary | ICD-10-CM

## 2015-03-09 DIAGNOSIS — E785 Hyperlipidemia, unspecified: Secondary | ICD-10-CM

## 2015-03-09 DIAGNOSIS — J309 Allergic rhinitis, unspecified: Secondary | ICD-10-CM

## 2015-03-09 DIAGNOSIS — I10 Essential (primary) hypertension: Secondary | ICD-10-CM | POA: Diagnosis not present

## 2015-03-09 DIAGNOSIS — G8929 Other chronic pain: Secondary | ICD-10-CM

## 2015-03-09 DIAGNOSIS — M4716 Other spondylosis with myelopathy, lumbar region: Secondary | ICD-10-CM | POA: Diagnosis not present

## 2015-03-09 DIAGNOSIS — R200 Anesthesia of skin: Secondary | ICD-10-CM

## 2015-03-09 DIAGNOSIS — R208 Other disturbances of skin sensation: Secondary | ICD-10-CM | POA: Diagnosis not present

## 2015-03-09 MED ORDER — OXYCODONE-ACETAMINOPHEN 7.5-325 MG PO TABS: 1.0000 | ORAL_TABLET | ORAL | Status: DC | PRN

## 2015-03-09 NOTE — Patient Instructions (Signed)
Dear Ollen Gross Budney, Thank you for coming in to clinic today.  1. Overall sounds like we are doing the best we can with your medicines. You are staying active which is our goal. 2. Continue muscle relaxant half to quarter tablet as needed may take 3 times daily 2. Continue Duloxtine (Cymbalta) and Trazodone. 3. Refilled Percocet for 3 months   Recommend Colonoscopy for screening.   If your pain is getting worse and involving your legs, then we will discuss referral to Orthopedics for spinal epidural injections.  If your shoulder pain worsens, we can refer you back to Orthopedic surgery.  Please schedule a follow-up appointment with Dr. Parks Ranger in 3 months for refills  If you have any other questions or concerns, please feel free to call the clinic to contact me. You may also schedule an earlier appointment if necessary.  However, if your symptoms get significantly worse, please go to the Emergency Department to seek immediate medical attention.  [name], [place]

## 2015-03-09 NOTE — Assessment & Plan Note (Signed)
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 Persistent subjective toe numbness and pain - No red flags. No persistent radicular symptoms, negative Seated SLR - Reviewed last UDS 12/29 - positive THC, appropriate opiate metabolites - Last Pain Contract (signed 05/2013), reviewed 11/09/14 - Self discontinued Gabapentin, Lyrica  Plan: 1. Continue Percocet (refills x 3 months printed today), continue Baclofen (2.5 to 5mg  TID PRN) 2. Cont Cymbalta 30mg  daily 3. Cont Mobic - re-check BMET (after 03/2015, for annual visit) 4. RTC 3 months - chronic pain management

## 2015-03-09 NOTE — Progress Notes (Signed)
Patient ID: Todd Hayes, male   DOB: 09/26/1956, 59 y.o.   MRN: [id number] Subjective:    HPI  CHRONIC PAIN SYNDROME / LOW BACK PAIN:  - Chronic LBP, follows up for same complaint today. Stable 5/10 pain currently, overall with stable to improved since last visit. - Currently reports he remains functional on current medicine, tolerating activity with regular walking, mowing lawn and household work. Describes increased pain, stiffness, and soreness in back and legs after long day or worse with prolonged sitting. Denies significant radiating pain lower ext - Continues to take Percocet as prescribed, max 6 tabs daily - Continues to take Baclofen 2.5mg  1-2x daily (cuts 10mg  tablets into quarters) - Continues Cymbalta 30mg  daily, tolerating - Continues Trazodone 150mg  qhs help with sleep in setting of chronic pain - Occasional heating pad use with some relief - Additionally with Right shoulder pain, worse with overhead movements. Chronic history of R-shoulder arthroscopy - Denies any fevers/chills, falls or injury, saddle anesthesia, urinary retention or incontinence  BILATERAL TOE PAIN / NUMBNESS: - Chronic complaint without significant change, bilateral toe numbness and intermittent pain, L >R - Not interested in further work-up or other medicines at this time - Self discontinued Gabapentin therapy, unclear if not effective vs intolerant. Previously even on Lyrica but did not tolerate - Denies any redness, swelling, calf pain or swelling  LEFT EAR ITCHING / DISCOMFORT: - Reports prior history of "hair in ears" that causes discomfort and itching. Admits to cleaning ears at times with Q-tips. - Denies any ear ache, loss of hearing  I have reviewed and updated the following as appropriate: allergies and current medications  Social Hx: Active smoker, not ready to quit  Review of Systems  See above HPI    Objective:   Physical Exam  BP 108/74 mmHg  Pulse 83  Temp(Src) 98.5 F (36.9  C) (Oral)  Ht 6' (1.829 m)  Wt 195 lb (88.451 kg)  BMI 26.44 kg/m2  Gen - well-appearing, NAD HEENT - NCAT, b/l TM's clear with some serous fluid L>R, no erythema or bulging, oropharynx clear Heart - RRR, no murmurs Lungs - CTAB, normal work of breathing MSK - Back - stable mild +TTP localized over lower Lumbar spinous processes, full active ROM forward flex, backward bending, negative seated SLR w/o radicular pain. Right shoulder with positive Hawkin's impingement testing, negative rotator cuff muscle str supraspinatus 5/5 and negative empty can test, limited full forward flexion, intact internal rotation Ext - no edema, non-tender, +2 peripheral pulses  Neuro - awake, alert, grossly non-focal, intact muscle str 5/5 bilateral ext grip and ankle dorsiflexion. Monofilament foot exam with diminished sensation to bilateral plantar surface great toe and 2nd toe medial > lateral decreased sensation. Normal gait     Assessment & Plan:     See specific A&P problem list for details.

## 2015-03-09 NOTE — Assessment & Plan Note (Addendum)
Chronic, stable without worsening. Localized to dorsal great (L4) and 2nd toe (L5) dermatomal pattern, consistent with known L-spine OA. - Self discontinued gabapentin and lyrica due to "muscle jerks" - h/o prior EMG with known polyneuropathy  Plan: 1. Monofilament testing - confirms localized sensory loss 2. Offered repeat EMG in future - declined. 3. Not interesting in repeat trial of neuropathic pain medicine, continue cymbalta

## 2015-03-09 NOTE — Assessment & Plan Note (Addendum)
See details under "Chronic Pain" - Stable without worsening radicular symptoms. If future worsening radicular symptoms with leg pain, consider referral to Ortho for possible epidural steroid injections

## 2015-03-09 NOTE — Assessment & Plan Note (Signed)
L>R serous otitis fluid without evidence of infection, no significant sinus congestion or edema. Suspected component of allergic rhinitis  Plan: 1. Recommended trial of Flonase 2-4 weeks - patient declined

## 2015-03-09 NOTE — Assessment & Plan Note (Signed)
Chronic R-shoulder pain, s/p R-shoulder arthroscopy (unsure which year), no other information on prior diagnosis - Continues with intermittent episodes pain, and overall limited ROM (over head), consistent with bursitis symptoms with supportive exam, appropriate rotator cuff strength and no sign of weakness or neuropathy.  Plan: 1. Continue chronic pain management 2. Encouraged conservative therapy, stretching, regular activity and exercise 3. Consider future referral back to Orthopedics in future if needed

## 2015-03-10 ENCOUNTER — Encounter: Payer: Self-pay | Admitting: Family Medicine

## 2015-03-10 ENCOUNTER — Telehealth: Payer: Self-pay | Admitting: Family Medicine

## 2015-03-10 NOTE — Telephone Encounter (Signed)
Last seen for OV 03/09/15 for chronic pain management, at that appointment I had advised patient to return in 2 to 3 months for annual physical and fasting labwork.  Future orders placed for BMET and Fasting Lipid Panel. Recommend for patient to schedule Lab Only appointment at [facility] to get blood drawn after 04/13/15 and schedule appointment anytime on or after that date for his annual physical.  Attempted to call patient on multiple attempts today 03/10/15, unable to reach patient. Letter printed and mailed to patient with this information.  [name], [place], PGY-2

## 2015-03-10 NOTE — Telephone Encounter (Signed)
States that it is too early to fill the prescriptions given to the patient at yesterday's visit. He wants to know if perhaps the PCP made a mistake with them because he can't fill one until May and the other until June. Please advise/ thanks Fonda Kinder, ASA

## 2015-03-10 NOTE — Telephone Encounter (Signed)
Last OV 03/09/15, called patient back and spoke to Mr. Suppes. He reports that he has not tried to get any of the percocet rx filled yet, but was concerned because I had listed the "Do not fill before date" as 04/01/15 and 05/02/15 on two prescriptions, and one did not have a do not fill before date. He expressed that he could not fill them that early.  I explained to him that this should not cause any problems with the pharmacy, because this was a do not fill "before" date. Advised that he can continue as he normally does and fill each rx around the 1st of each month for next 3 months, should be no problem. The reason for the do not fill before dates on 20th was due to the date of his last appointment, I did not want him to wait longer than 1 month to be able to fill his prescription. He acknowledges, questions answered.  Additionally, I reiterated plans for him to schedule a fasting Lab Only appointment after 04/13/2015, orders placed (BMET, Lipid panel) and follow-up within 3 months for an annual physical and to review blood work.  [name], [place], PGY-2

## 2015-04-04 ENCOUNTER — Other Ambulatory Visit: Payer: Self-pay | Admitting: Family Medicine

## 2015-04-04 DIAGNOSIS — G4700 Insomnia, unspecified: Secondary | ICD-10-CM

## 2015-04-05 ENCOUNTER — Other Ambulatory Visit: Payer: Self-pay | Admitting: Family Medicine

## 2015-04-05 NOTE — Telephone Encounter (Signed)
Pt called and needs refills on his Trazodone and Requip. He would like BOTH of these to go to [place] on [address] in [place]. jw

## 2015-04-06 ENCOUNTER — Telehealth: Payer: Self-pay | Admitting: Family Medicine

## 2015-04-06 MED ORDER — ROPINIROLE HCL 0.25 MG PO TABS: ORAL_TABLET | ORAL | Status: DC

## 2015-04-06 NOTE — Telephone Encounter (Signed)
Pt called because [facility] didn't receive his prescription for Trazodone. Can we call this over again. jw

## 2015-04-06 NOTE — Telephone Encounter (Signed)
Refills sent in for both Trazodone and Requip  Todd Hayes, [place], PGY-2

## 2015-04-06 NOTE — Telephone Encounter (Signed)
Spoke with patient and informed him that Rx for Trazodone is ready for pick up.  Derl Barrow, RN

## 2015-04-20 DIAGNOSIS — H40033 Anatomical narrow angle, bilateral: Secondary | ICD-10-CM | POA: Diagnosis not present

## 2015-04-20 DIAGNOSIS — H2513 Age-related nuclear cataract, bilateral: Secondary | ICD-10-CM | POA: Diagnosis not present

## 2015-05-04 ENCOUNTER — Other Ambulatory Visit: Payer: Self-pay | Admitting: Family Medicine

## 2015-05-10 ENCOUNTER — Other Ambulatory Visit: Payer: Self-pay | Admitting: Family Medicine

## 2015-05-10 DIAGNOSIS — S46811D Strain of other muscles, fascia and tendons at shoulder and upper arm level, right arm, subsequent encounter: Secondary | ICD-10-CM

## 2015-05-10 DIAGNOSIS — G8929 Other chronic pain: Secondary | ICD-10-CM

## 2015-05-24 ENCOUNTER — Ambulatory Visit (INDEPENDENT_AMBULATORY_CARE_PROVIDER_SITE_OTHER): Payer: Medicare Other | Admitting: Family Medicine

## 2015-05-24 ENCOUNTER — Encounter: Payer: Self-pay | Admitting: Family Medicine

## 2015-05-24 VITALS — BP 152/68 | HR 69 | Temp 98.8°F | Ht 72.0 in | Wt 193.0 lb

## 2015-05-24 DIAGNOSIS — M4716 Other spondylosis with myelopathy, lumbar region: Secondary | ICD-10-CM

## 2015-05-24 DIAGNOSIS — R208 Other disturbances of skin sensation: Secondary | ICD-10-CM | POA: Diagnosis not present

## 2015-05-24 DIAGNOSIS — Z720 Tobacco use: Secondary | ICD-10-CM

## 2015-05-24 DIAGNOSIS — E785 Hyperlipidemia, unspecified: Secondary | ICD-10-CM

## 2015-05-24 DIAGNOSIS — G2581 Restless legs syndrome: Secondary | ICD-10-CM | POA: Diagnosis not present

## 2015-05-24 DIAGNOSIS — I10 Essential (primary) hypertension: Secondary | ICD-10-CM | POA: Diagnosis not present

## 2015-05-24 DIAGNOSIS — J449 Chronic obstructive pulmonary disease, unspecified: Secondary | ICD-10-CM | POA: Diagnosis not present

## 2015-05-24 DIAGNOSIS — M129 Arthropathy, unspecified: Secondary | ICD-10-CM

## 2015-05-24 DIAGNOSIS — J309 Allergic rhinitis, unspecified: Secondary | ICD-10-CM

## 2015-05-24 DIAGNOSIS — R200 Anesthesia of skin: Secondary | ICD-10-CM

## 2015-05-24 DIAGNOSIS — M19011 Primary osteoarthritis, right shoulder: Secondary | ICD-10-CM

## 2015-05-24 DIAGNOSIS — G8929 Other chronic pain: Secondary | ICD-10-CM | POA: Diagnosis present

## 2015-05-24 LAB — LIPID PANEL
Cholesterol: 206 mg/dL — ABNORMAL HIGH (ref 0–200)
HDL: 42 mg/dL (ref >=40)
LDL Cholesterol: 119 mg/dL — ABNORMAL HIGH (ref 0–99)
Total CHOL/HDL Ratio: 4.9 Ratio
Triglycerides: 226 mg/dL — ABNORMAL HIGH (ref <150)
VLDL: 45 mg/dL — AB (ref 0–40)

## 2015-05-24 LAB — BASIC METABOLIC PANEL WITH GFR
BUN: 16 mg/dL (ref 6–23)
CO2: 31 meq/L (ref 19–32)
CREATININE: 1.25 mg/dL (ref 0.50–1.35)
Calcium: 9.1 mg/dL (ref 8.4–10.5)
Chloride: 101 mEq/L (ref 96–112)
GFR, Est African American: 73 mL/min
GFR, Est Non African American: 63 mL/min
GLUCOSE: 106 mg/dL — AB (ref 70–99)
Potassium: 4.6 mEq/L (ref 3.5–5.3)
Sodium: 141 mEq/L (ref 135–145)

## 2015-05-24 MED ORDER — OXYCODONE-ACETAMINOPHEN 7.5-325 MG PO TABS
1.0000 | ORAL_TABLET | ORAL | Status: DC | PRN
Start: 2015-05-24 — End: 2015-05-24

## 2015-05-24 MED ORDER — ROPINIROLE HCL 0.25 MG PO TABS: 0.5000 mg | ORAL_TABLET | Freq: Every day | ORAL | Status: DC

## 2015-05-24 MED ORDER — LISINOPRIL 20 MG PO TABS: 10.0000 mg | ORAL_TABLET | Freq: Every day | ORAL | Status: DC

## 2015-05-24 MED ORDER — FLUTICASONE PROPIONATE 50 MCG/ACT NA SUSP: 1.0000 | Freq: Every day | NASAL | Status: DC

## 2015-05-24 MED ORDER — LISINOPRIL 10 MG PO TABS: 10.0000 mg | ORAL_TABLET | Freq: Every day | ORAL | Status: DC

## 2015-05-24 MED ORDER — MELOXICAM 7.5 MG PO TABS: 7.5000 mg | ORAL_TABLET | Freq: Every day | ORAL | Status: DC

## 2015-05-24 MED ORDER — DULOXETINE HCL 30 MG PO CPEP: 30.0000 mg | ORAL_CAPSULE | Freq: Every day | ORAL | Status: DC

## 2015-05-24 MED ORDER — OXYCODONE-ACETAMINOPHEN 7.5-325 MG PO TABS: 1.0000 | ORAL_TABLET | ORAL | Status: DC | PRN

## 2015-05-24 NOTE — Progress Notes (Signed)
Patient ID: Todd Hayes, male   DOB: 05-01-1956, 59 y.o.   MRN: [id number] Subjective:    HPI  CHRONIC PAIN SYNDROME / LBP / R shoulder pain: - Chronic LBP / Right shoulder pain (known lumbar spondylosis and myelopathy also R-shoulder OA s/p arthroscopy previously), follows up for same complaint. Today 6/10 pain currently, overall with stable to improved since last visit. - Currently reports he remains functional on current medicine, tolerating activity with regular walking, mowing lawn and household work, build fence. Continues to endorse intermittent flares of increase back pain with prolonged activity and stiffness with prolonged sitting. Continues to endorse R-shoulder pain, worse with overhead. Pain improves with activity. - Continues to take Percocet as prescribed, max 6 tabs daily - Continues to take Baclofen 5mg  1-2x daily mostly only at night (cuts 10mg  tablets into half) - Continues Cymbalta 30mg  daily, tolerating (no longer taking 60mg , self titrated down over past 6 months) - Continues Trazodone 150mg  qhs help with sleep in setting of chronic pain - Occasional heating pad use with some relief - Denies any fevers/chills, falls or injury, saddle anesthesia, urinary retention or incontinence  BILATERAL TOE PAIN / NUMBNESS: - Chronic complaint without significant change, intermittent episodes of bilateral toe numbness and pain, L >R - Again declines referral to Neurology for further testing and evaluation. - Previously self discontinued Gabapentin and Lyrica - due to side-effect intolerance of "shakes" - Denies any redness, swelling, calf pain or swelling  LEFT EAR FULLNESS / ITCHING - History of prior reported "hair itching ears", also with some reported leakage of fluid and fullness. Previously discussed allergies and trial Flonase, declined at that time. Today agreeable to trial. - Denies any ear ache, loss of hearing  TOBACCO ABUSE - Chronic >40 year smoking history. Not ready  to quit. Not previously quit. Prior smoke up to 2ppd  I have reviewed and updated the following as appropriate: allergies and current medications  Social Hx: Active smoker.  Review of Systems  See above HPI    Objective:   Physical Exam  BP 152/68 mmHg  Pulse 69  Temp(Src) 98.8 F (37.1 C) (Oral)  Ht 6' (1.829 m)  Wt 193 lb (87.544 kg)  BMI 26.17 kg/m2  Gen - chronically ill but well-appearing, NAD HEENT - NCAT, b/l TM's clear with some stable mild serous fluid L>R, no erythema or bulging, oropharynx clear Heart - RRR, no murmurs Lungs - Scattered exp wheezes. No focal crackles or rhonchi. Good air movement. Non-labored. MSK - Back - stable mild +TTP localized over lower Lumbar spinous processes, full active ROM forward flex, backward bending, negative seated SLR w/o radicular pain. Right shoulder - improved full active ROM f-flexion and abduction with some discomfort, positive impingement testing, good rotator cuff strength. Ext - no edema, non-tender, +2 peripheral pulses Neuro - awake, alert, grossly non-focal, intact muscle str 5/5 bilateral ext grip and ankle dorsiflexion. Normal gait     Assessment & Plan:     See specific A&P problem list for details.

## 2015-05-24 NOTE — Patient Instructions (Signed)
Dear Todd Hayes, Thank you for coming in to clinic today.  1. Stay active, use heating pad on your shoulder - let me know if you need me to refer you to Orthopedics 2. MEDICATION CHANGES: - You may take Baclofen muscle relaxant half or whole tab up to 3 times a day - Lisinopril (blood pressure pill) - take one whole tablet daily (I reduced your dose, this is same as half tab as before) - Cymbalta reduced to 30mg  tablet - take one whole tablet daily (as you were) 3. Refilled Percocet for 3 months  4. Pick up Flonase - use 1 spray in each nostril daily for next 1 month to see if helps, if does can continue 5. Consider adding alternative medication for your toes in future - Elavil  Recommend Colonoscopy for screening.   If your pain is getting worse and involving your legs, then we will discuss referral to Orthopedics for spinal epidural injections.  If you are interested in quitting smoking - please schedule Pharmacy Clinic Appointment Dr. Valentina Lucks  If your shoulder pain worsens, we can refer you back to Orthopedic surgery.  Please schedule a follow-up appointment with Dr. Parks Ranger in 3 months for refills  If you have any other questions or concerns, please feel free to call the clinic to contact me. You may also schedule an earlier appointment if necessary.  However, if your symptoms get significantly worse, please go to the Emergency Department to seek immediate medical attention.  [name], [place]

## 2015-05-24 NOTE — Assessment & Plan Note (Signed)
Clinical diagnosis, no prior PFTs. Extensive tobacco abuse history >40 years smoking. Exam with scattered wheezing. No recent exacerbations or inc sputum production.  Plan: 1. Offered Albuterol HFA rescue inhaler rx - patient declined. Does not want to use any inhalers, previously rx but refuses to use any. 2. Consider future PFTs, daily maintenance therapy if further counseling

## 2015-05-24 NOTE — Assessment & Plan Note (Addendum)
See details under "Chronic Pain" - Stable without worsening radicular symptoms. If future worsening radicular symptoms with leg pain, consider referral to Ortho for possible epidural steroid injections

## 2015-05-24 NOTE — Assessment & Plan Note (Signed)
Active smoker with long smoking history (>40 yr). Currently smoking 0.5 to 1ppd, declines cessation assistance with NRT, medications, or counseling. Absolutely no desire to quit.

## 2015-05-24 NOTE — Assessment & Plan Note (Signed)
Stable chronic R-shoulder pain, likely previous flare with bursitis. No evidence of rotator cuff tendinopathy, no weakness  Plan: 1. Continue chronic pain meds 2. Improve conservative therapy (not using heating pads regularly, continue activity) 3. Declines referral to Ortho or [facility]

## 2015-05-24 NOTE — Assessment & Plan Note (Signed)
Stable, unchanged - Consider TCA with amitriptyline at future f/u - Declined neurology referral

## 2015-05-24 NOTE — Assessment & Plan Note (Addendum)
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 Also R-shoulder chronic OA pain with likely intermittent flares bursitis. Rotator cuff seems intact without weakness. - No red flags. No persistent radicular symptoms - Due review pain contract and re-sign 10/2015 - Self discontinued Gabapentin, Lyrica  Plan: 1. Continue Percocet (refills x 3 months printed today), continue Baclofen 2. Cont Cymbalta 30mg  daily (reduced dose on rx, to match patient's actual dosage) 3. Cont Mobic (re-check BMET today) 4. RTC 3 months - chronic pain management

## 2015-05-24 NOTE — Assessment & Plan Note (Addendum)
Elevated BP today, likely due to pain  Plan: 1. Continue current BP meds, refills for Lisinopril 20mg  tabs daily, advised to take whole tabs, pt plans to continue trial on half tabs 10mg  2. Check BMET 3. Smoking cessation, not ready to quit

## 2015-05-24 NOTE — Assessment & Plan Note (Addendum)
Possible etiology for some sinus congestion with serous otitis effusions, prolonged time outdoors with multiple allergy exposures.  Plan: Start trial Flonase 1 spray BID x 1 month then PRN

## 2015-05-24 NOTE — Addendum Note (Signed)
Addended by: Olin Hauser on: 05/24/2015 09:59 PM   Modules accepted: Miquel Dunn

## 2015-05-30 ENCOUNTER — Encounter: Payer: Self-pay | Admitting: Family Medicine

## 2015-07-11 DIAGNOSIS — I10 Essential (primary) hypertension: Secondary | ICD-10-CM | POA: Diagnosis not present

## 2015-07-11 DIAGNOSIS — M25474 Effusion, right foot: Secondary | ICD-10-CM | POA: Diagnosis not present

## 2015-07-11 DIAGNOSIS — S92501A Displaced unspecified fracture of right lesser toe(s), initial encounter for closed fracture: Secondary | ICD-10-CM | POA: Diagnosis not present

## 2015-07-11 DIAGNOSIS — S92511A Displaced fracture of proximal phalanx of right lesser toe(s), initial encounter for closed fracture: Secondary | ICD-10-CM | POA: Diagnosis not present

## 2015-07-11 DIAGNOSIS — Z886 Allergy status to analgesic agent status: Secondary | ICD-10-CM | POA: Diagnosis not present

## 2015-07-11 DIAGNOSIS — M7989 Other specified soft tissue disorders: Secondary | ICD-10-CM | POA: Diagnosis not present

## 2015-07-25 ENCOUNTER — Encounter: Payer: Self-pay | Admitting: Family Medicine

## 2015-07-25 ENCOUNTER — Ambulatory Visit (INDEPENDENT_AMBULATORY_CARE_PROVIDER_SITE_OTHER): Payer: Medicare Other | Admitting: Family Medicine

## 2015-07-25 VITALS — BP 128/66 | HR 63 | Temp 98.0°F | Ht 72.0 in | Wt 190.2 lb

## 2015-07-25 DIAGNOSIS — S92911D Unspecified fracture of right toe(s), subsequent encounter for fracture with routine healing: Secondary | ICD-10-CM

## 2015-07-25 DIAGNOSIS — M129 Arthropathy, unspecified: Secondary | ICD-10-CM | POA: Diagnosis not present

## 2015-07-25 DIAGNOSIS — M75101 Unspecified rotator cuff tear or rupture of right shoulder, not specified as traumatic: Secondary | ICD-10-CM

## 2015-07-25 DIAGNOSIS — G4700 Insomnia, unspecified: Secondary | ICD-10-CM

## 2015-07-25 DIAGNOSIS — M7541 Impingement syndrome of right shoulder: Secondary | ICD-10-CM | POA: Insufficient documentation

## 2015-07-25 DIAGNOSIS — M19011 Primary osteoarthritis, right shoulder: Secondary | ICD-10-CM

## 2015-07-25 MED ORDER — METHYLPREDNISOLONE ACETATE 40 MG/ML IJ SUSP
40.0000 mg | Freq: Once | INTRAMUSCULAR | Status: AC
  Administered 2015-07-25: 40 mg via INTRA_ARTICULAR

## 2015-07-25 MED ORDER — TRAZODONE HCL 150 MG PO TABS: 300.0000 mg | ORAL_TABLET | Freq: Every day | ORAL | Status: DC

## 2015-07-25 NOTE — Assessment & Plan Note (Signed)
Refilled Trazodone, pt had been without for period of time due to some unclear report with pharmacy not filling.

## 2015-07-25 NOTE — Assessment & Plan Note (Signed)
Likely underlying cause of worsening R-shoulder impingement syndrome

## 2015-07-25 NOTE — Assessment & Plan Note (Signed)
Consistent with acute on chronic worsening R-shoulder pain, likely rotator cuff tendinitis vs bursitis component with positive testing today, worse overhead activities and limited ROM. Likely in setting of known R-shoulder OA. Less likely rotator cuff tear with preserved strength. - Last imaging R-shoulder x-ray (01/2013), mod AC DJD  Plan: 1. R-shoulder subacromial injection today as trial to see if improves 2. Continue chronic pain management with oxycodone, tramadol, mobic, cymbalta 3. Relative rest but keep shoulder mobile, demonstrated ROM exercises, avoid heavy lifting 4. May try heating pad PRN 5. RTC 1 month re-evaluation, if worsening night-time symptoms or weakness, consider referral back to Ortho since s/p arthroscopic surgery >15 yrs ago

## 2015-07-25 NOTE — Progress Notes (Signed)
Subjective:    Patient ID: Todd Hayes, male    DOB: 03-Oct-1956, 59 y.o.   MRN: [id number]  Todd Hayes is a 59 y.o. male presenting on 07/25/2015 for injection in shoulder  HPI  RIGHT SHOULDER PAIN / CHRONIC PAIN SYNDROME: - Chronic pain syndrome with LBP and some generalized pain, continues on multiple chronic pain medicines including opiates. Additionally significant known Right shoulder OA with prior s/p arthroscopic surgery approx 2000-2002 by Dr. 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 - Last R-shoulder X-ray 01/2013 with moderate AC joint DJD, no acute findings. - Last visit 05/24/15 for chronic pain management, discussed this complaint, but seemed to be stable, declined injection or referral back to Ortho. - Today returns for gradual worsening R-shoulder pain, his chronic pain medicine with oxy, tramadol seems to help but does not resolve his pain. Worsening with overactivity, admits to hearing/feeling some popping in shoulder, worse with some movements overhead and reaching out to side. Denies any weakness in hand or grip on right side, mostly complains of pain in shoulder.  RIGHT 2nd TOE PAIN / FRACTURE?: - Recent history with 2 week ago had injury with stubbing Right 2nd toe, described incident with nearly stepping on cat and stumbled, hit toe on a wall. Felt painful, did have some bruising and swelling. Went to [facility] ED, evaluated and had X-rays, was told it was broken, given reassurance, walking post-op shoe with firm sole and advised to follow-up. No referral to Ortho. - Today with resolved bruising and swelling, still some residual pain with ambulation, otherwise much improved.  LEFT EAR FULLNESS / ITCHING - Discussed at last visit 05/24/15 with h/o of prior reported "hair itching ears", had started Flonase with some relief, now seems to not be resolving. He reports having  some "numbing ear drops at home that may be expired", he is now aware that these have been discontinued   Past Medical History  Diagnosis Date  . Allergy   . Hyperlipidemia   . Hypertension   . Chronic pain   . Insomnia   . GERD (gastroesophageal reflux disease)     Social History   Social History  . Marital Status: Married    Spouse Name: N/A  . Number of Children: N/A  . Years of Education: N/A   Occupational History  . Not on file.   Social History Main Topics  . Smoking status: Current Every Day Smoker -- 1.00 packs/day    Types: Cigarettes  . Smokeless tobacco: Not on file  . Alcohol Use: 0.0 oz/week    0 Standard drinks or equivalent per week  . Drug Use: Not on file  . Sexual Activity: Not on file   Other Topics Concern  . Not on file   Social History Narrative    Current Outpatient Prescriptions on File Prior to Visit  Medication Sig  . baclofen (LIORESAL) 10 MG tablet TAKE ONE-HALF TO ONE TABLET BY MOUTH THREE TIMES DAILY  . DULoxetine (CYMBALTA) 30 MG capsule Take 1 capsule (30 mg total) by mouth daily.  . fluticasone (FLONASE) 50 MCG/ACT nasal spray Place 1 spray into both nostrils daily.  Marland Kitchen lisinopril (PRINIVIL,ZESTRIL) 20 MG tablet Take 0.5 tablets (10 mg total) by mouth daily.  . meloxicam (MOBIC) 7.5 MG tablet Take 1 tablet (7.5 mg total) by mouth daily.  Marland Kitchen oxyCODONE-acetaminophen (PERCOCET) 7.5-325 MG per tablet Take 1 tablet by mouth every  4 (four) hours as needed.  . pantoprazole (PROTONIX) 20 MG tablet Take 1 tablet (20 mg total) by mouth daily.  Marland Kitchen rOPINIRole (REQUIP) 0.25 MG tablet Take 2 tablets (0.5 mg total) by mouth at bedtime.   No current facility-administered medications on file prior to visit.    Review of Systems  Constitutional: Negative for fever, chills, diaphoresis, activity change, appetite change and fatigue.  HENT: Negative for congestion and hearing loss.        Left ear itching  Eyes: Negative for visual disturbance.    Respiratory: Negative for cough, chest tightness, shortness of breath and wheezing.   Cardiovascular: Negative for chest pain, palpitations and leg swelling.  Gastrointestinal: Negative for nausea, vomiting, abdominal pain, diarrhea and constipation.  Genitourinary: Negative for dysuria, frequency and hematuria.  Musculoskeletal: Positive for back pain and arthralgias (Right shoulder, and right 2nd toe). Negative for neck pain.  Skin: Negative for rash.  Neurological: Negative for dizziness, weakness, light-headedness, numbness and headaches.  Hematological: Negative for adenopathy.  Psychiatric/Behavioral: Negative for behavioral problems and confusion.   Per HPI unless specifically indicated above     Objective:    BP 128/66 mmHg  Pulse 63  Temp(Src) 98 F (36.7 C) (Oral)  Ht 6' (1.829 m)  Wt 190 lb 3.2 oz (86.274 kg)  BMI 25.79 kg/m2  Wt Readings from Last 3 Encounters:  07/25/15 190 lb 3.2 oz (86.274 kg)  05/24/15 193 lb (87.544 kg)  03/09/15 195 lb (88.451 kg)    Physical Exam  Constitutional: He is oriented to person, place, and time. He appears well-developed and well-nourished. No distress.  Comfortable, cooperative, wearing firm post-op boot on right foot  HENT:  Head: Normocephalic and atraumatic.  Right Ear: External ear normal.  Left Ear: External ear normal.  Mouth/Throat: Oropharynx is clear and moist.  Bilateral TM's clear and b/l ear canals normal without lesions. No drainage in Left ear.  Eyes: Conjunctivae and EOM are normal. Pupils are equal, round, and reactive to light.  Neck: Normal range of motion. Neck supple. No thyromegaly present.  Cardiovascular: Normal rate, regular rhythm, normal heart sounds and intact distal pulses.   No murmur heard. Musculoskeletal: He exhibits no edema.       Right shoulder: He exhibits decreased range of motion (Forward flex and abduct above head. Mild limited internal rotation), tenderness (Postive Hawkins impingement  testing, mild positive empty testing but no significant rotator cuff weakness ), crepitus, pain and spasm. He exhibits no bony tenderness, no swelling, no effusion, no deformity, no laceration and normal strength.       Right foot: There is tenderness. There is normal range of motion, no bony tenderness, no swelling, normal capillary refill, no crepitus, no deformity and no laceration.       Feet:  Lymphadenopathy:    He has no cervical adenopathy.  Neurological: He is alert and oriented to person, place, and time.  Skin: Skin is warm and dry. No rash noted. He is not diaphoretic.  Nursing note and vitals reviewed.  Results for orders placed or performed in visit on 40/98/11  BASIC METABOLIC PANEL WITH GFR  Result Value Ref Range   Sodium 141 135 - 145 mEq/L   Potassium 4.6 3.5 - 5.3 mEq/L   Chloride 101 96 - 112 mEq/L   CO2 31 19 - 32 mEq/L   Glucose, Bld 106 (H) 70 - 99 mg/dL   BUN 16 6 - 23 mg/dL   Creat 1.25 0.50 - 1.35 mg/dL  Calcium 9.1 8.4 - 10.5 mg/dL   GFR, Est African American 73 mL/min   GFR, Est Non African American 63 mL/min  Lipid panel  Result Value Ref Range   Cholesterol 206 (H) 0 - 200 mg/dL   Triglycerides 226 (H) <150 mg/dL   HDL 42 >=40 mg/dL   Total CHOL/HDL Ratio 4.9 Ratio   VLDL 45 (H) 0 - 40 mg/dL   LDL Cholesterol 119 (H) 0 - 99 mg/dL        Procedure: RIGHT Subacromial Bursa (shoulder) injection Verbal consent obtained, written consent signed and scanned into record. Medication:  1 cc Depo-medrol 40mg  and 4 cc Lidocaine 1% without epi, total 5cc volume Time Out taken  Landmarks identified. Area cleansed with alcohol wipes, cold spray applied prior to injection.Using 21 gauge 1 and 1/2 length needle, Right subacromial bursa space was injected posteriorly (with above listed medication).Sterile bandage placed.Patient tolerated procedure well without bleeding or paresthesias.No complications.  Assessment & Plan:   Problem List Items Addressed  This Visit      Musculoskeletal and Integument   Rotator cuff impingement syndrome of right shoulder - Primary    Consistent with acute on chronic worsening R-shoulder pain, likely rotator cuff tendinitis vs bursitis component with positive testing today, worse overhead activities and limited ROM. Likely in setting of known R-shoulder OA. Less likely rotator cuff tear with preserved strength. - Last imaging R-shoulder x-ray (01/2013), mod AC DJD  Plan: 1. R-shoulder subacromial injection today as trial to see if improves 2. Continue chronic pain management with oxycodone, tramadol, mobic, cymbalta 3. Relative rest but keep shoulder mobile, demonstrated ROM exercises, avoid heavy lifting 4. May try heating pad PRN 5. RTC 1 month re-evaluation, if worsening night-time symptoms or weakness, consider referral back to Ortho since s/p arthroscopic surgery >15 yrs ago      Relevant Medications   methylPREDNISolone acetate (DEPO-MEDROL) injection 40 mg (Completed)     Other   Arthritis of right shoulder region    Likely underlying cause of worsening R-shoulder impingement syndrome      Insomnia    Refilled Trazodone, pt had been without for period of time due to some unclear report with pharmacy not filling.      Relevant Medications   traZODone (DESYREL) 150 MG tablet    Other Visit Diagnoses    Toe fracture, right, with routine healing, subsequent encounter        Significantly improved. Reassuring today. Resolved ecchymosis, edema, and improved pain. Tolerating wt bearing ambulation. Follow PRN       Meds ordered this encounter  Medications  . traZODone (DESYREL) 150 MG tablet    Sig: Take 2 tablets (300 mg total) by mouth at bedtime.    Dispense:  60 tablet    Refill:  2  . methylPREDNISolone acetate (DEPO-MEDROL) injection 40 mg    Sig:       Follow up plan: Return in about 4 weeks (around 08/22/2015) for right shoulder rotator cuff, chronic pain f/u.  [name], [place], PGY-3

## 2015-07-25 NOTE — Patient Instructions (Signed)
Dear Todd Hayes, Thank you for coming in to clinic today.  1. Your Right Shoulder is likely flared up, known arthritis and prior rotator cuff injury, this can be chronic pain. For now, did Steroid Injection may take 24-48 hours to take effect, pain may get worse tonight, use heating pad and rest it. Hopefully this will last up to a few months. 2. Continue Flonase - use 1 spray in each nostril daily for next 1 month to see if helps, if does can continue 3. For your Right Toes - if there was a fracture, it seems to be healing, I do not have any concerns today and would not make any changes. If worsening can follow-up at Urgent Care in meantime  If you are interested in quitting smoking - please schedule Pharmacy Clinic Appointment Dr. Valentina Lucks  If your shoulder pain worsens, we can refer you back to Orthopedic surgery.  Please schedule a follow-up appointment with Dr. Parks Ranger in 1 month for Follow-up Right Rotator Cuff and Chronic Pain Medicine refills  If you have any other questions or concerns, please feel free to call the clinic to contact me. You may also schedule an earlier appointment if necessary.  However, if your symptoms get significantly worse, please go to the Emergency Department to seek immediate medical attention.  Todd Hayes, [place]

## 2015-08-11 ENCOUNTER — Other Ambulatory Visit: Payer: Self-pay | Admitting: Family Medicine

## 2015-08-11 DIAGNOSIS — K219 Gastro-esophageal reflux disease without esophagitis: Secondary | ICD-10-CM

## 2015-08-11 MED ORDER — PANTOPRAZOLE SODIUM 20 MG PO TBEC: 20.0000 mg | DELAYED_RELEASE_TABLET | Freq: Every day | ORAL | Status: DC

## 2015-08-11 NOTE — Telephone Encounter (Signed)
Pt needs pantoprazole refilled and sent to [facility] [address] [place], [place] [address]; Phone: [phone]. Sadie Reynolds, ASA

## 2015-08-19 ENCOUNTER — Ambulatory Visit (INDEPENDENT_AMBULATORY_CARE_PROVIDER_SITE_OTHER): Payer: Medicare Other | Admitting: Family Medicine

## 2015-08-19 ENCOUNTER — Encounter: Payer: Self-pay | Admitting: Family Medicine

## 2015-08-19 VITALS — BP 148/75 | HR 94 | Temp 98.1°F | Ht 72.0 in | Wt 195.0 lb

## 2015-08-19 DIAGNOSIS — Z7189 Other specified counseling: Secondary | ICD-10-CM

## 2015-08-19 DIAGNOSIS — S46811D Strain of other muscles, fascia and tendons at shoulder and upper arm level, right arm, subsequent encounter: Secondary | ICD-10-CM | POA: Diagnosis not present

## 2015-08-19 DIAGNOSIS — I10 Essential (primary) hypertension: Secondary | ICD-10-CM

## 2015-08-19 DIAGNOSIS — G8929 Other chronic pain: Secondary | ICD-10-CM | POA: Diagnosis present

## 2015-08-19 DIAGNOSIS — M7541 Impingement syndrome of right shoulder: Secondary | ICD-10-CM

## 2015-08-19 DIAGNOSIS — M75101 Unspecified rotator cuff tear or rupture of right shoulder, not specified as traumatic: Secondary | ICD-10-CM

## 2015-08-19 DIAGNOSIS — M4716 Other spondylosis with myelopathy, lumbar region: Secondary | ICD-10-CM | POA: Diagnosis not present

## 2015-08-19 MED ORDER — MELOXICAM 7.5 MG PO TABS: 7.5000 mg | ORAL_TABLET | Freq: Every day | ORAL | Status: DC

## 2015-08-19 MED ORDER — OXYCODONE-ACETAMINOPHEN 7.5-325 MG PO TABS: 1.0000 | ORAL_TABLET | ORAL | Status: DC | PRN

## 2015-08-19 MED ORDER — BACLOFEN 10 MG PO TABS: 5.0000 mg | ORAL_TABLET | Freq: Three times a day (TID) | ORAL | Status: DC | PRN

## 2015-08-19 NOTE — Assessment & Plan Note (Signed)
Persistent R-shoulder pain with reduced overhead ROM, significant improvement s/p last visit R-shoulder subacromial steroid injection lasted only x 2 weeks.  Plan: 1. Referral to Ortho (Dr. Earleen Newport)

## 2015-08-19 NOTE — Patient Instructions (Signed)
Dear Todd Hayes, Thank you for coming in to clinic today.  1. Refilled pain medicine today - You have severe arthritis in Right shoulder, and likely contributing to your pain  2. Continue Flonase - use 1 spray in each nostril daily for next 1 month to see if helps, if does can continue - Try Debrox ear drops to help reduce wax and irritation in ear, these are over the counter  If you are interested in quitting smoking - please schedule Pharmacy Clinic Appointment Dr. Valentina Lucks  Referral to Orthopedic surgery, you should get an appointment within next few days to weeks, call our clinic to check status if you have not heard by 3 to 4 weeks.  Please schedule a follow-up appointment with Dr. Parks Ranger in 3 months for Chronic Pain  If you have any other questions or concerns, please feel free to call the clinic to contact me. You may also schedule an earlier appointment if necessary.  However, if your symptoms get significantly worse, please go to the Emergency Department to seek immediate medical attention.  Todd Hayes, [name]

## 2015-08-19 NOTE — Assessment & Plan Note (Signed)
Refilled Baclofen, chronic spasm with R-rotator cuff shoulder pain

## 2015-08-19 NOTE — Assessment & Plan Note (Addendum)
See details in overview - Refilled Percocet x 3 months - Next OV 3 mo due for repeat UDS and re-sign [facility] pain contract

## 2015-08-19 NOTE — Assessment & Plan Note (Addendum)
See details under "Chronic Pain" - Stable without worsening radicular symptoms - Referral to Ortho for R-Shoulder and Lumbar pain - no worsening radicular symptoms at this time
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
## 2015-08-19 NOTE — Assessment & Plan Note (Signed)
Stable to mild elevated SBP today, in setting of chronic pain  Plan: 1. No changes to meds - continue Lisinopril 20mg  daily 2. No labs today, SCr stable last checked 1.2-1.3 3. Continued to counsel on smoking cessation, pt refuses and not ready to quit

## 2015-08-19 NOTE — Assessment & Plan Note (Signed)
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 Also R-shoulder chronic OA pain with likely intermittent flares bursitis. Rotator cuff seems intact without weakness - did give improvement with R-subscapular steroid injection 1 mo ago (significantly resolved pain but only lasted 2 weeks) - No LBP red flag sypmptoms. No persistent radicular symptoms - Due review pain contract and re-sign 10/2015 to 11/2015 (next 3 mo OV) - Self discontinued Gabapentin, Lyrica  Plan: 1. Continue Percocet (refills x 3 months printed today), continue Baclofen 2. Cont Cymbalta 30mg  daily (reduced dose on rx, to match patient's actual dosage) 3. Cont Mobic 7.5 - emphasized future trial off next visit 4. RTC 3 months - chronic pain management, note patient requested initially to go up to 10/325 percocet, but then agreed to stay where he was, he continues to decline referral to Pain Management

## 2015-08-24 DIAGNOSIS — S92301A Fracture of unspecified metatarsal bone(s), right foot, initial encounter for closed fracture: Secondary | ICD-10-CM | POA: Diagnosis not present

## 2015-08-24 DIAGNOSIS — M79671 Pain in right foot: Secondary | ICD-10-CM | POA: Diagnosis not present

## 2015-08-24 DIAGNOSIS — M7541 Impingement syndrome of right shoulder: Secondary | ICD-10-CM | POA: Diagnosis not present

## 2015-08-24 DIAGNOSIS — M25511 Pain in right shoulder: Secondary | ICD-10-CM | POA: Diagnosis not present

## 2015-10-12 DIAGNOSIS — L03031 Cellulitis of right toe: Secondary | ICD-10-CM | POA: Diagnosis not present

## 2015-10-12 DIAGNOSIS — G5791 Unspecified mononeuropathy of right lower limb: Secondary | ICD-10-CM | POA: Diagnosis not present

## 2015-10-12 DIAGNOSIS — L97511 Non-pressure chronic ulcer of other part of right foot limited to breakdown of skin: Secondary | ICD-10-CM | POA: Diagnosis not present

## 2015-10-12 DIAGNOSIS — S90424A Blister (nonthermal), right lesser toe(s), initial encounter: Secondary | ICD-10-CM | POA: Diagnosis not present

## 2015-10-17 ENCOUNTER — Other Ambulatory Visit: Payer: Self-pay | Admitting: *Deleted

## 2015-10-17 DIAGNOSIS — G4700 Insomnia, unspecified: Secondary | ICD-10-CM

## 2015-10-17 MED ORDER — TRAZODONE HCL 150 MG PO TABS: 300.0000 mg | ORAL_TABLET | Freq: Every day | ORAL | Status: DC

## 2015-10-17 NOTE — Telephone Encounter (Signed)
Patient verified DOB Patient called to check on Trazodone refill. Please refill medication and inform patient.

## 2015-11-17 ENCOUNTER — Ambulatory Visit (INDEPENDENT_AMBULATORY_CARE_PROVIDER_SITE_OTHER): Payer: Medicare Other | Admitting: Family Medicine

## 2015-11-17 ENCOUNTER — Encounter: Payer: Self-pay | Admitting: Family Medicine

## 2015-11-17 VITALS — BP 132/102 | HR 87 | Temp 98.2°F | Ht 72.0 in | Wt 194.0 lb

## 2015-11-17 DIAGNOSIS — G8929 Other chronic pain: Secondary | ICD-10-CM | POA: Diagnosis not present

## 2015-11-17 DIAGNOSIS — M7541 Impingement syndrome of right shoulder: Secondary | ICD-10-CM

## 2015-11-17 DIAGNOSIS — M75101 Unspecified rotator cuff tear or rupture of right shoulder, not specified as traumatic: Secondary | ICD-10-CM | POA: Diagnosis not present

## 2015-11-17 DIAGNOSIS — Z7189 Other specified counseling: Secondary | ICD-10-CM | POA: Diagnosis not present

## 2015-11-17 DIAGNOSIS — Z720 Tobacco use: Secondary | ICD-10-CM

## 2015-11-17 DIAGNOSIS — M4716 Other spondylosis with myelopathy, lumbar region: Secondary | ICD-10-CM | POA: Diagnosis not present

## 2015-11-17 MED ORDER — OXYCODONE-ACETAMINOPHEN 7.5-325 MG PO TABS: 1.0000 | ORAL_TABLET | ORAL | Status: DC | PRN

## 2015-11-17 NOTE — Patient Instructions (Signed)
Dear Todd Hayes, Thank you for coming in to clinic today.  1. Refilled pain medicine today  Follow-up with Dr Rolena Infante [facility] for your Right Shoulder Arthitis, may need future injections, and may also need Physical Therapy or discuss future surgery  Checked Urine Drug Screen today and Signed Pain Contract  If you are interested in quitting smoking - please schedule Pharmacy Clinic Appointment Dr. Valentina Lucks  Please schedule a follow-up appointment with Dr. Parks Ranger in 3 months for Chronic Pain  If you have any other questions or concerns, please feel free to call the clinic to contact me. You may also schedule an earlier appointment if necessary.  However, if your symptoms get significantly worse, please go to the Emergency Department to seek immediate medical attention.  [name], [place]

## 2015-11-17 NOTE — Assessment & Plan Note (Signed)
See details under "Chronic Pain" - Stable without worsening radicular symptoms - Established with Ortho Dr Rolena Infante

## 2015-11-17 NOTE — Assessment & Plan Note (Signed)
Active smoker with long smoking history - Smoking cessation counseling, however patient declined and no interest in quitting

## 2015-11-17 NOTE — Assessment & Plan Note (Addendum)
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 Also R-shoulder chronic OA pain - No LBP red flag sypmptoms. No persistent radicular symptoms - Self discontinued Gabapentin, Lyrica  Plan: 1. Collected UDS w/ reflex, pending - discussed this with patient 2. Reviewed and signed new copy of [facility] controlled substance policy Pain Contract, scanned into chart 3. Continue Percocet (refills x 3 months printed today), continue Baclofen 4. Cont Cymbalta 30mg  daily 5. Cont Mobic 7.5 - again discussed considering discontinue on trial off, patient thinks this is helping wants to continue 6. RTC 3 months - chronic pain management

## 2015-11-17 NOTE — Assessment & Plan Note (Signed)
See details in overview - Repeat UDS, review [facility] Pain Contract - Refilled Percocet x 3 months

## 2015-11-17 NOTE — Assessment & Plan Note (Addendum)
Persistent R-shoulder pain, despite recent steroid injections, now established with Ortho. Appears to have good ROM today - Continue to follow-up Ortho to discuss potential surgical options, patient agreeable - Refill chronic pain meds - Keep shoulder active, avoid adhesive capsulitis

## 2015-11-17 NOTE — Progress Notes (Signed)
Subjective:    Patient ID: Todd Hayes, male    DOB: 1956-10-29, 60 y.o.   MRN: [id number]  Todd Hayes is a 60 y.o. male presenting on 11/17/2015 for Medication Refill  HPI  CHRONIC PAIN SYNDROME / LBP / R shoulder pain with rotator cuff tendinopathy: - Chronic LBP / Right shoulder pain (known lumbar spondylosis and myelopathy also R-shoulder OA - Has established with Ortho since last visit, referred for R-shoulder pain, about 1 mo ago by Dr Rolena Infante, s/p cortisone injection with improvement x 3 weeks, then pain resumed - Today presents for chronic pain follow-up. R-shoulder is worst pain, otherwise back pain is improved today, reports stable pain 7/10 with intermittent flares.  - Continues to take Percocet as prescribed, max 6 tabs daily - Continues to take Baclofen 10mg  1-2x daily mostly only at night, had increased dose - Continues Cymbalta 30mg  daily, tolerating - Continues Trazodone 150mg  qhs help with sleep in setting of chronic pain - Occasional heating pad use with some relief - Denies any fevers/chills, falls or injury, saddle anesthesia, urinary retention or incontinence  Past Medical History  Diagnosis Date  . Allergy   . Hyperlipidemia   . Hypertension   . Chronic pain   . Insomnia   . GERD (gastroesophageal reflux disease)     Social History   Social History  . Marital Status: Married    Spouse Name: N/A  . Number of Children: N/A  . Years of Education: N/A   Occupational History  . Not on file.   Social History Main Topics  . Smoking status: Current Every Day Smoker -- 1.00 packs/day    Types: Cigarettes  . Smokeless tobacco: Not on file  . Alcohol Use: 0.0 oz/week    0 Standard drinks or equivalent per week  . Drug Use: Not on file  . Sexual Activity: Not on file   Other Topics Concern  . Not on file   Social History Narrative    Current Outpatient Prescriptions on File Prior to Visit  Medication Sig  . baclofen (LIORESAL) 10 MG tablet Take  0.5-1 tablets (5-10 mg total) by mouth 3 (three) times daily as needed for muscle spasms (pain).  . DULoxetine (CYMBALTA) 30 MG capsule Take 1 capsule (30 mg total) by mouth daily.  . fluticasone (FLONASE) 50 MCG/ACT nasal spray Place 1 spray into both nostrils daily.  Marland Kitchen lisinopril (PRINIVIL,ZESTRIL) 20 MG tablet Take 0.5 tablets (10 mg total) by mouth daily.  . meloxicam (MOBIC) 7.5 MG tablet Take 1 tablet (7.5 mg total) by mouth daily.  . pantoprazole (PROTONIX) 20 MG tablet Take 1 tablet (20 mg total) by mouth daily.  Marland Kitchen rOPINIRole (REQUIP) 0.25 MG tablet Take 2 tablets (0.5 mg total) by mouth at bedtime.  . traZODone (DESYREL) 150 MG tablet Take 2 tablets (300 mg total) by mouth at bedtime.   No current facility-administered medications on file prior to visit.    Review of Systems Per HPI unless specifically indicated above     Objective:    BP 132/102 mmHg  Pulse 87  Temp(Src) 98.2 F (36.8 C) (Oral)  Ht 6' (1.829 m)  Wt 194 lb (87.998 kg)  BMI 26.31 kg/m2  Wt Readings from Last 3 Encounters:  11/17/15 194 lb (87.998 kg)  08/19/15 195 lb (88.451 kg)  07/25/15 190 lb 3.2 oz (86.274 kg)    Physical Exam  Constitutional: He appears well-developed and well-nourished. No distress.  HENT:  Mouth/Throat: Oropharynx is clear and  moist.  Cardiovascular: Normal rate, regular rhythm, normal heart sounds and intact distal pulses.   No murmur heard. Pulmonary/Chest: Effort normal and breath sounds normal. No respiratory distress. He has no wheezes. He has no rales.  Musculoskeletal: He exhibits no edema.  Back - stable with bilateral lumbar paraspinal muscle spasm R>L without significant +TTP. No bony tenderness or deformity. full active ROM, negative seated SLR w/o radicular pain.  Neurological: He is alert.  Skin: Skin is warm and dry. He is not diaphoretic.  Nursing note and vitals reviewed.  Results for orders placed or performed in visit on [id number]  BASIC METABOLIC PANEL WITH  GFR  Result Value Ref Range   Sodium 141 135 - 145 mEq/L   Potassium 4.6 3.5 - 5.3 mEq/L   Chloride 101 96 - 112 mEq/L   CO2 31 19 - 32 mEq/L   Glucose, Bld 106 (H) 70 - 99 mg/dL   BUN 16 6 - 23 mg/dL   Creat 1.25 0.50 - 1.35 mg/dL   Calcium 9.1 8.4 - 10.5 mg/dL   GFR, Est African American 73 mL/min   GFR, Est Non African American 63 mL/min  Lipid panel  Result Value Ref Range   Cholesterol 206 (H) 0 - 200 mg/dL   Triglycerides 226 (H) <150 mg/dL   HDL 42 >=40 mg/dL   Total CHOL/HDL Ratio 4.9 Ratio   VLDL 45 (H) 0 - 40 mg/dL   LDL Cholesterol 119 (H) 0 - 99 mg/dL      Assessment & Plan:   Problem List Items Addressed This Visit    Chronic pain    Stable chronic pain, consistent with primarily due to 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 Also R-shoulder chronic OA pain - No LBP red flag sypmptoms. No persistent radicular symptoms - Self discontinued Gabapentin, Lyrica  Plan: 1. Collected UDS w/ reflex, pending - discussed this with patient 2. Reviewed and signed new copy of [facility] controlled substance policy Pain Contract, scanned into chart 3. Continue Percocet (refills x 3 months printed today), continue Baclofen 4. Cont Cymbalta 30mg  daily 5. Cont Mobic 7.5 - again discussed considering discontinue on trial off, patient thinks this is helping wants to continue 6. RTC 3 months - chronic pain management      Relevant Medications   oxyCODONE-acetaminophen (PERCOCET) 7.5-325 MG tablet   Other Relevant Orders   Drug Scr Ur, Pain Mgmt, Reflex Conf   Encounter for chronic pain management - Primary    See details in overview - Repeat UDS, review [place] Pain Contract - Refilled Percocet x 3 months       Relevant Orders   Drug Scr Ur, Pain Mgmt, Reflex Conf   Lumbar spondylosis with myelopathy    See details under "Chronic Pain" - Stable without worsening radicular symptoms - Established with Ortho Dr Rolena Infante      Relevant Medications   oxyCODONE-acetaminophen (PERCOCET)  7.5-325 MG tablet   Other Relevant Orders   Drug Scr Ur, Pain Mgmt, Reflex Conf   Rotator cuff impingement syndrome of right shoulder    Persistent R-shoulder pain, despite recent steroid injections, now established with Ortho. Appears to have good ROM today - Continue to follow-up Ortho to discuss potential surgical options, patient agreeable - Refill chronic pain meds - Keep shoulder active, avoid adhesive capsulitis      Tobacco abuse    Active smoker with long smoking history - Smoking cessation counseling, however patient declined and no interest in quitting  Meds ordered this encounter  Medications  . DISCONTD: oxyCODONE-acetaminophen (PERCOCET) 7.5-325 MG tablet    Sig: Take 1 tablet by mouth every 4 (four) hours as needed.    Dispense:  180 tablet    Refill:  0  . DISCONTD: oxyCODONE-acetaminophen (PERCOCET) 7.5-325 MG tablet    Sig: Take 1 tablet by mouth every 4 (four) hours as needed.    Dispense:  180 tablet    Refill:  0    Do not fill before 12/14/2015  . oxyCODONE-acetaminophen (PERCOCET) 7.5-325 MG tablet    Sig: Take 1 tablet by mouth every 4 (four) hours as needed.    Dispense:  180 tablet    Refill:  0    Do not fill before 01/11/2016      Follow up plan: Return in about 3 months (around 02/15/2016) for chronic pain.   [name], [place], PGY-3

## 2015-11-18 LAB — DRUG SCR UR, PAIN MGMT, REFLEX CONF
Barbiturate Quant, Ur: NEGATIVE
Benzodiazepines.: NEGATIVE
CREATININE, U: 188.15 mg/dL
Cocaine Metabolites: NEGATIVE
MARIJUANA METABOLITE: NEGATIVE
METHADONE: NEGATIVE
PROPOXYPHENE: NEGATIVE
Phencyclidine (PCP): NEGATIVE

## 2015-11-22 LAB — OPIATES/OPIOIDS (LC/MS-MS)
Codeine Urine: NEGATIVE ng/mL (ref <50)
Hydrocodone: NEGATIVE ng/mL (ref <50)
Hydromorphone: NEGATIVE ng/mL (ref <50)
Morphine Urine: NEGATIVE ng/mL (ref <50)
Norhydrocodone, Ur: NEGATIVE ng/mL (ref <50)
Noroxycodone, Ur: >10000 ng/mL — ABNORMAL HIGH (ref <50)
OXYCODONE, UR: 7481 ng/mL — AB (ref <50)
OXYMORPHONE, URINE: 2821 ng/mL — AB (ref <50)

## 2015-11-22 LAB — AMPHETAMINES (GC/LC/MS), URINE
Amphetamine GC/MS Conf: NEGATIVE ng/mL (ref <250)
METHAMPHETAMINE QUANT UR: NEGATIVE ng/mL (ref <250)

## 2015-11-23 ENCOUNTER — Telehealth: Payer: Self-pay | Admitting: Family Medicine

## 2015-11-23 DIAGNOSIS — G2581 Restless legs syndrome: Secondary | ICD-10-CM

## 2015-11-23 MED ORDER — ROPINIROLE HCL 0.25 MG PO TABS: 0.5000 mg | ORAL_TABLET | Freq: Every day | ORAL | Status: DC

## 2015-11-23 NOTE — Telephone Encounter (Signed)
Refilled Requip.  [name], [place], PGY-3

## 2015-11-23 NOTE — Telephone Encounter (Signed)
Pt called and needs a refill on his requip called in. jw

## 2015-12-15 DIAGNOSIS — S82035D Nondisplaced transverse fracture of left patella, subsequent encounter for closed fracture with routine healing: Secondary | ICD-10-CM | POA: Diagnosis not present

## 2016-01-09 ENCOUNTER — Other Ambulatory Visit: Payer: Self-pay | Admitting: Family Medicine

## 2016-01-09 DIAGNOSIS — G8929 Other chronic pain: Secondary | ICD-10-CM

## 2016-01-09 DIAGNOSIS — S46811D Strain of other muscles, fascia and tendons at shoulder and upper arm level, right arm, subsequent encounter: Secondary | ICD-10-CM

## 2016-01-09 DIAGNOSIS — G4700 Insomnia, unspecified: Secondary | ICD-10-CM

## 2016-01-09 MED ORDER — BACLOFEN 10 MG PO TABS: 5.0000 mg | ORAL_TABLET | Freq: Three times a day (TID) | ORAL | Status: DC | PRN

## 2016-01-09 MED ORDER — TRAZODONE HCL 150 MG PO TABS: 300.0000 mg | ORAL_TABLET | Freq: Every day | ORAL | Status: DC

## 2016-01-09 NOTE — Telephone Encounter (Signed)
Pt called and needs a refill on his Trazodone and Baclofen called in. jw

## 2016-01-12 ENCOUNTER — Ambulatory Visit: Payer: Medicare Other

## 2016-01-12 ENCOUNTER — Encounter: Payer: Medicare Other | Admitting: Podiatry

## 2016-01-12 DIAGNOSIS — S99921A Unspecified injury of right foot, initial encounter: Secondary | ICD-10-CM

## 2016-01-12 NOTE — Progress Notes (Signed)
This encounter was created in error - please disregard.

## 2016-01-19 ENCOUNTER — Telehealth: Payer: Self-pay | Admitting: *Deleted

## 2016-01-19 ENCOUNTER — Ambulatory Visit (INDEPENDENT_AMBULATORY_CARE_PROVIDER_SITE_OTHER): Payer: Medicare Other | Admitting: Podiatry

## 2016-01-19 ENCOUNTER — Ambulatory Visit (INDEPENDENT_AMBULATORY_CARE_PROVIDER_SITE_OTHER): Payer: Medicare Other

## 2016-01-19 DIAGNOSIS — M79674 Pain in right toe(s): Secondary | ICD-10-CM | POA: Diagnosis not present

## 2016-01-19 DIAGNOSIS — S92911A Unspecified fracture of right toe(s), initial encounter for closed fracture: Secondary | ICD-10-CM

## 2016-01-19 DIAGNOSIS — D169 Benign neoplasm of bone and articular cartilage, unspecified: Secondary | ICD-10-CM | POA: Diagnosis not present

## 2016-01-19 NOTE — Progress Notes (Signed)
Subjective:     Patient ID: Todd Hayes, male   DOB: [birth year], 60 y.o.   MRN: [id number]  HPI patient presents stating he traumatized his lesser digits in December and thinks he broke his toes but he is having a lot of pain between the fifth toe and fourth toe right foot with what he thinks is a lesion and it is impossible for him to trim it and impossible for him to wear shoe gear comfortably   Review of Systems  All other systems reviewed and are negative.      Objective:   Physical Exam  Constitutional: He is oriented to person, place, and time.  Cardiovascular: Intact distal pulses.   Musculoskeletal: Normal range of motion.  Neurological: He is oriented to person, place, and time.  Skin: Skin is warm.  Nursing note and vitals reviewed.  neurovascular status intact muscle strength adequate range of motion was within normal limits with patient having negative Homans sign and noted to have mild forefoot edema right that exquisite discomfort in the fifth digit right distal medial surface with keratotic tissue formation and pain when palpated. 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 Patient states she's tried to trim it himself without relief and would like a definitive solution and I have recommended exostectomy area patient wants procedure understanding risk and at this time I allowed him to sign consent form for surgical procedure going over alternative treatments and complications as listed. Patient wants surgery signed consent form and is scheduled for outpatient surgery at this time and is given all instructions for procedure. I did debride lesion to take pressure off of  X-ray report indicated there is small spurring of the right fifth toe with rotation and I do not see  indications of acute fractures of remaining digits

## 2016-01-19 NOTE — Patient Instructions (Signed)
Pre-Operative Instructions  Congratulations, you have decided to take an important step to improving your quality of life.  You can be assured that the doctors of [facility] will be with you every step of the way.  1. Plan to be at the surgery center/hospital at least 1 (one) hour prior to your scheduled time unless otherwise directed by the surgical center/hospital staff.  You must have a responsible adult accompany you, remain during the surgery and drive you home.  Make sure you have directions to the surgical center/hospital and know how to get there on time. 2. For hospital based surgery you will need to obtain a history and physical form from your family physician within 1 month prior to the date of surgery- we will give you a form for you primary physician.  3. We make every effort to accommodate the date you request for surgery.  There are however, times where surgery dates or times have to be moved.  We will contact you as soon as possible if a change in schedule is required.   4. No Aspirin/Ibuprofen for one week before surgery.  If you are on aspirin, any non-steroidal anti-inflammatory medications (Mobic, Aleve, Ibuprofen) you should stop taking it 7 days prior to your surgery.  You make take Tylenol  For pain prior to surgery.  5. Medications- If you are taking daily heart and blood pressure medications, seizure, reflux, allergy, asthma, anxiety, pain or diabetes medications, make sure the surgery center/hospital is aware before the day of surgery so they may notify you which medications to take or avoid the day of surgery. 6. No food or drink after midnight the night before surgery unless directed otherwise by surgical center/hospital staff. 7. No alcoholic beverages 24 hours prior to surgery.  No smoking 24 hours prior to or 24 hours after surgery. 8. Wear loose pants or shorts- loose enough to fit over bandages, boots, and casts. 9. No slip on shoes, sneakers are best. 10. Bring  your boot with you to the surgery center/hospital.  Also bring crutches or a walker if your physician has prescribed it for you.  If you do not have this equipment, it will be provided for you after surgery. 11. If you have not been contracted by the surgery center/hospital by the day before your surgery, call to confirm the date and time of your surgery. 12. Leave-time from work may vary depending on the type of surgery you have.  Appropriate arrangements should be made prior to surgery with your employer. 13. Prescriptions will be provided immediately following surgery by your doctor.  Have these filled as soon as possible after surgery and take the medication as directed. 14. Remove nail polish on the operative foot. 15. Wash the night before surgery.  The night before surgery wash the foot and leg well with the antibacterial soap provided and water paying special attention to beneath the toenails and in between the toes.  Rinse thoroughly with water and dry well with a towel.  Perform this wash unless told not to do so by your physician.  Enclosed: 1 Ice pack (please put in freezer the night before surgery)   1 Hibiclens skin cleaner   Pre-op Instructions  If you have any questions regarding the instructions, do not hesitate to call our office.  [place]: [address] [place], [place] [address] [phone]  [place]: [address] , [place] [address] [phone]  [place]: [address]  [place], [place] [address] [phone]  Dr. Richard   Tuchman DPM, Dr. Keylin Podolsky DPM Dr. Richard Sikora DPM, Dr. M. Todd Hyatt DPM, Dr. Kathryn Egerton DPM 

## 2016-01-19 NOTE — Progress Notes (Signed)
   Subjective:    Patient ID: Todd Hayes, male    DOB: [birth year], 60 y.o.   MRN: [id number]  HPI PT presents with pain in all 4 toes from previous injury in December last year.    Review of Systems  All other systems reviewed and are negative.      Objective:   Physical Exam        Assessment & Plan:

## 2016-01-20 NOTE — Telephone Encounter (Signed)
Patient called but didn't leave a message.  I'm returning the call for [employer].  "He has gone to the store, he's not in."  He called yesterday but didn't leave a message.  "I'm his sister-in-law.  He saw something about imaging on his paperwork and was wondering if he had to do x-rays or something and where he'd have to go to do this."  He doesn't need any imaging.  That was done here while he was here.  He had x-rays taken yesterday.  All he has to do is come for his appointment on 01/25/2016 at 7:45am.  "Okay, he'll be there."

## 2016-01-25 ENCOUNTER — Encounter: Payer: Self-pay | Admitting: Podiatry

## 2016-01-25 ENCOUNTER — Ambulatory Visit (INDEPENDENT_AMBULATORY_CARE_PROVIDER_SITE_OTHER): Payer: Medicare Other | Admitting: Podiatry

## 2016-01-25 VITALS — BP 148/84 | HR 66 | Resp 16 | Ht 72.0 in | Wt 200.0 lb

## 2016-01-25 DIAGNOSIS — D169 Benign neoplasm of bone and articular cartilage, unspecified: Secondary | ICD-10-CM

## 2016-01-25 MED ORDER — HYDROCODONE-ACETAMINOPHEN 10-325 MG PO TABS: ORAL_TABLET | ORAL | Status: DC

## 2016-01-25 NOTE — Progress Notes (Signed)
   Subjective:    Patient ID: Todd Hayes, male    DOB: [birth year], 60 y.o.   MRN: [id number]  HPI "He's going to cut the bone spur off my right foot."   Review of Systems     Objective:   Physical Exam        Assessment & Plan:

## 2016-01-25 NOTE — Progress Notes (Signed)
Subjective:     Patient ID: Todd Hayes, male   DOB: [birth year], 60 y.o.   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 Sterile prep applied to the right foot tourniquet applied and it was inflated to 250 mmHg. Following procedure was performed. Attention directed to the medial side fifth digit right were 2 semielliptical incisions were performed over the offending lesion and the intervening skin wedge was removed in toto. Utilizing a sidecutting bur the spur was resected completely and found to be in excellent position after removal of bone paste. I flushed the area with copious amounts of sterile Garamycin solution and sutured the skin with 5-0 nylon and applied sterile dressing. Instructed on elevation and dispensed surgical shoe and patient will be seen back to recheck again for stitch removal 2 weeks or earlier if any issues should occur

## 2016-02-02 ENCOUNTER — Other Ambulatory Visit: Payer: Self-pay

## 2016-02-08 ENCOUNTER — Ambulatory Visit (INDEPENDENT_AMBULATORY_CARE_PROVIDER_SITE_OTHER): Payer: Medicare Other | Admitting: Podiatry

## 2016-02-08 ENCOUNTER — Ambulatory Visit (INDEPENDENT_AMBULATORY_CARE_PROVIDER_SITE_OTHER): Payer: Medicare Other

## 2016-02-08 DIAGNOSIS — D169 Benign neoplasm of bone and articular cartilage, unspecified: Secondary | ICD-10-CM

## 2016-02-08 DIAGNOSIS — Z9889 Other specified postprocedural states: Secondary | ICD-10-CM | POA: Diagnosis not present

## 2016-02-09 NOTE — Progress Notes (Signed)
Subjective:     Patient ID: Todd Hayes, male   DOB: 05-18-1956, 60 y.o.   MRN: [id number]  HPI patient states I'm doing real well with my right foot with minimal discomfort   Review of Systems     Objective:   Physical Exam Neurovascular status intact with patient 2 weeks post exostectomy right fifth toe with wound edges well coapted stitches intact    Assessment:     Doing well post exostectomy fifth digit right    Plan:     Stitches removed x-ray reviewed and sterile dressing applied to the fifth toe. Continued open toed shoes for several more weeks and reappoint to recheck  X-ray report indicated satisfactory section of bone with good alignment noted

## 2016-02-13 ENCOUNTER — Encounter: Payer: Self-pay | Admitting: Family Medicine

## 2016-02-13 ENCOUNTER — Ambulatory Visit (INDEPENDENT_AMBULATORY_CARE_PROVIDER_SITE_OTHER): Payer: Medicare Other | Admitting: Family Medicine

## 2016-02-13 VITALS — BP 110/60 | HR 84 | Temp 98.2°F | Ht 72.0 in | Wt 201.0 lb

## 2016-02-13 DIAGNOSIS — B349 Viral infection, unspecified: Secondary | ICD-10-CM | POA: Diagnosis not present

## 2016-02-13 DIAGNOSIS — Z7189 Other specified counseling: Secondary | ICD-10-CM

## 2016-02-13 DIAGNOSIS — M4716 Other spondylosis with myelopathy, lumbar region: Secondary | ICD-10-CM

## 2016-02-13 DIAGNOSIS — G8929 Other chronic pain: Secondary | ICD-10-CM | POA: Diagnosis not present

## 2016-02-13 DIAGNOSIS — R197 Diarrhea, unspecified: Secondary | ICD-10-CM

## 2016-02-13 MED ORDER — OXYCODONE-ACETAMINOPHEN 7.5-325 MG PO TABS: 1.0000 | ORAL_TABLET | ORAL | Status: DC | PRN

## 2016-02-13 MED ORDER — MELOXICAM 7.5 MG PO TABS: 7.5000 mg | ORAL_TABLET | Freq: Every day | ORAL | Status: DC

## 2016-02-13 NOTE — Patient Instructions (Signed)
Dear Todd Hayes, Thank you for coming in to clinic today.  1. Refilled pain medicine today  Follow-up with Dr [facility] for your Right Shoulder Arthitis, may need future injections, and may also need Physical Therapy or discuss future surgery  For your Diarrhea, Most likely from a virus. You can take Immodium or other over the counter anti-diarrhea medicines, Pepto-bismul might help as well, as needed for now. This should run it's course in 7 to 10 days.  If you get worsening symptoms, persistent fever/chills/sweats, chest pain, coughing, abdominal pain, nausea, vomiting or other concerns please follow-up for re-evaluation.  If you are interested in quitting smoking - please schedule Pharmacy Clinic Appointment Dr. [name]  Please schedule a follow-up appointment in 3 months for Chronic Pain  If you have any other questions or concerns, please feel free to call the clinic to contact me. You may also schedule an earlier appointment if necessary.  However, if your symptoms get significantly worse, please go to the Emergency Department to seek immediate medical attention.  Todd Hayes, [name]

## 2016-02-13 NOTE — Progress Notes (Signed)
Subjective:    Patient ID: Todd Hayes, male    DOB: 04-21-1956, 60 y.o.   MRN: [id number]  Todd Hayes is a 60 y.o. male presenting on 02/13/2016 for Medication Refill  HPI  VIRAL SYNDROME: - Reports symptoms started 2 days ago with feeling feverish with shaking chills, sweats overnight, and nocturia, also with diarrhea. He is questioning if this is related to food poisoning, if seems to occur only nightly for past 2 nights after eating hotdog and hamburger, others ate same food without symptoms. Currently feels well and asymptomatic, does get abdominal cramping prior to diarrhea episodes. - No sick contacts. Did not get flu shot this season. - Admits diarrhea (not watery, loose, several episodes, last today x 2 earlier today) - Denies any cough, chest pain, shortness of breath, abdominal pain, headache, swelling  CHRONIC PAIN SYNDROME / LBP / R shoulder pain with rotator cuff tendinopathy: - Chronic LBP / Right shoulder pain (known lumbar spondylosis and myelopathy also R-shoulder OA - Reports some worsening Right shoulder pain returned on 01/01/16 secondary to known OA and bone spur, he is followed by Dr Rolena Infante (Ortho) last had steroid injection back in 10/2015 with good relief. Patient is considering options for future R-shoulder surgery - Today presents for chronic pain follow-up. Reports stable Low Back pain 5-6/10 with intermittent flares.  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 Recently had surgical procedure to shave down spur on 01/25/16, tolerated well. Additionally he was given opiate rx Hydrocodone 10/325 #20 tabs at that time. Patient has filled this rx and completed it, he notified me today of this.   Social History  Substance Use Topics  . Smoking status: Current Every Day Smoker -- 1.00 packs/day    Types: Cigarettes  . Smokeless tobacco: None  . Alcohol Use: 0.0 oz/week    0 Standard drinks or equivalent per week    Review of Systems Per HPI unless specifically indicated above     Objective:    BP 110/60 mmHg  Pulse 84  Temp(Src) 98.2 F (36.8 C) (Oral)  Ht 6' (1.829 m)  Wt 201 lb (91.173 kg)  BMI 27.25 kg/m2  SpO2 94%  Wt Readings from Last 3 Encounters:  02/13/16 201 lb (91.173 kg)  01/25/16 200 lb (90.719 kg)  11/17/15 194 lb (87.998 kg)    Physical Exam  Constitutional: He appears well-developed and well-nourished. No distress.  Well-appearing, comfortable, cooperative  HENT:  Head: Normocephalic and atraumatic.  Mouth/Throat: Oropharynx is clear and moist.  Sinuses non-tender. TM's clear with normal landmarks some chronic TM scarring, otherwise no effusion, no erythema.  Eyes: Conjunctivae are normal. Right eye exhibits no discharge. Left eye exhibits no discharge.  Neck: Normal range of motion. Neck supple.  Cardiovascular: Normal rate, regular rhythm, normal heart sounds and intact distal pulses.   No murmur heard. Pulmonary/Chest: Effort normal and breath sounds normal. No respiratory distress. He has no  wheezes. He has no rales.  Abdominal: Soft. Bowel sounds are normal. He exhibits no distension and no mass. There is no tenderness. There is no rebound and no guarding.  Obese abdomen  Musculoskeletal: He exhibits no edema.  Low Back Inspection: No obvious deformity Palpation: bilateral lumbar paraspinal muscle spasm without significant +TTP,  no spinous process tenderness ROM: full active ROM Special Testing: Seated SLR no radicular pain Strength: 5/5 grip and ankles Neurovascular: distally intact  Lymphadenopathy:    He has no cervical adenopathy.  Neurological: He is alert.  Skin: Skin is warm and dry. He is not diaphoretic.  Nursing note and vitals reviewed.      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 Also R-shoulder chronic OA pain, Right foot pain s/p exostosis debridement surgery - No LBP red flag sypmptoms. No persistent radicular symptoms - Self discontinued Gabapentin, Lyrica, intolerance to Tylenol  Plan: 1. Refilled Percocet #180 for 1 month (refills x 3 months printed today) 2. Continue Baclofen, Cymbalta 30mg  daily, refilled Mobic 7.5mg  daily (consider future trial off) 3. Smoking cessation 4. RTC 3 months - chronic pain management, future consider trial of amitriptyline (goal to wean down on opiate dose)      Relevant Medications   oxyCODONE-acetaminophen (PERCOCET) 7.5-325 MG tablet   meloxicam (MOBIC) 7.5 MG tablet    Other Visit Diagnoses    Acute viral syndrome        Unclear if viral vs food poisoning, afebrile, asymptomatic, benign abdomen, no focal infxn, may try OTC anti-diarrheal for few days, return criteria given.    Diarrhea, unspecified type           Meds ordered this encounter  Medications  . DISCONTD: oxyCODONE-acetaminophen (PERCOCET) 7.5-325 MG  tablet    Sig: Take 1 tablet by mouth every 4 (four) hours as needed.    Dispense:  180 tablet    Refill:  0  . DISCONTD: oxyCODONE-acetaminophen (PERCOCET) 7.5-325 MG tablet    Sig: Take 1 tablet by mouth every 4 (four) hours as needed.    Dispense:  180 tablet    Refill:  0    Do not fill before 03/12/2016  . oxyCODONE-acetaminophen (PERCOCET) 7.5-325 MG tablet    Sig: Take 1 tablet by mouth every 4 (four) hours as needed.    Dispense:  180 tablet    Refill:  0    Do not fill before 04/12/2016  . meloxicam (MOBIC) 7.5 MG tablet    Sig: Take 1 tablet (7.5 mg total) by mouth daily.    Dispense:  90 tablet    Refill:  1      Follow up plan: Return in about 3 months (around 05/14/2016) for chronic pain.   [name], [place], PGY-3

## 2016-02-14 NOTE — Assessment & Plan Note (Signed)
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 Also R-shoulder chronic OA pain, Right foot pain s/p exostosis debridement surgery - No LBP red flag sypmptoms. No persistent radicular symptoms - Self discontinued Gabapentin, Lyrica, intolerance to Tylenol  Plan: 1. Refilled Percocet #180 for 1 month (refills x 3 months printed today) 2. Continue Baclofen, Cymbalta 30mg  daily, refilled Mobic 7.5mg  daily (consider future trial off) 3. Smoking cessation 4. RTC 3 months - chronic pain management, future consider trial of amitriptyline (goal to wean down on opiate dose)

## 2016-02-14 NOTE — Assessment & Plan Note (Signed)
See details under "Chronic Pain" - Stable without worsening radicular symptoms

## 2016-02-14 NOTE — Assessment & Plan Note (Signed)
See details in overview - Updated UDS and Pain contract at last visit 11/2015, not due until 11/2016 - Refilled Percocet x 3 months

## 2016-03-07 ENCOUNTER — Other Ambulatory Visit: Payer: Self-pay | Admitting: Family Medicine

## 2016-03-07 DIAGNOSIS — J309 Allergic rhinitis, unspecified: Secondary | ICD-10-CM

## 2016-03-07 MED ORDER — FLUTICASONE PROPIONATE 50 MCG/ACT NA SUSP: 1.0000 | Freq: Every day | NASAL | Status: DC

## 2016-03-07 NOTE — Telephone Encounter (Signed)
Refill request for Flonase

## 2016-04-16 ENCOUNTER — Telehealth: Payer: Self-pay | Admitting: Family Medicine

## 2016-04-16 DIAGNOSIS — G4700 Insomnia, unspecified: Secondary | ICD-10-CM

## 2016-04-16 MED ORDER — TRAZODONE HCL 150 MG PO TABS: 300.0000 mg | ORAL_TABLET | Freq: Every day | ORAL | Status: DC

## 2016-04-16 NOTE — Telephone Encounter (Signed)
Pt is calling because he needs a refill on his Trazodone. jw

## 2016-04-16 NOTE — Telephone Encounter (Signed)
Refileld Trazodone  [name], [place], PGY-3

## 2016-05-10 ENCOUNTER — Encounter: Payer: Self-pay | Admitting: Family Medicine

## 2016-05-10 ENCOUNTER — Ambulatory Visit (INDEPENDENT_AMBULATORY_CARE_PROVIDER_SITE_OTHER): Payer: Medicare Other | Admitting: Family Medicine

## 2016-05-10 VITALS — BP 122/65 | HR 77 | Temp 98.9°F | Ht 72.0 in | Wt 209.0 lb

## 2016-05-10 DIAGNOSIS — Z7189 Other specified counseling: Secondary | ICD-10-CM

## 2016-05-10 DIAGNOSIS — M129 Arthropathy, unspecified: Secondary | ICD-10-CM

## 2016-05-10 DIAGNOSIS — M75101 Unspecified rotator cuff tear or rupture of right shoulder, not specified as traumatic: Secondary | ICD-10-CM | POA: Diagnosis not present

## 2016-05-10 DIAGNOSIS — M19011 Primary osteoarthritis, right shoulder: Secondary | ICD-10-CM

## 2016-05-10 DIAGNOSIS — M7541 Impingement syndrome of right shoulder: Secondary | ICD-10-CM

## 2016-05-10 DIAGNOSIS — M4716 Other spondylosis with myelopathy, lumbar region: Secondary | ICD-10-CM

## 2016-05-10 DIAGNOSIS — G2581 Restless legs syndrome: Secondary | ICD-10-CM

## 2016-05-10 DIAGNOSIS — G8929 Other chronic pain: Secondary | ICD-10-CM

## 2016-05-10 MED ORDER — OXYCODONE-ACETAMINOPHEN 7.5-325 MG PO TABS: 1.0000 | ORAL_TABLET | ORAL | Status: DC | PRN

## 2016-05-10 MED ORDER — ROPINIROLE HCL 0.25 MG PO TABS: 0.5000 mg | ORAL_TABLET | Freq: Every day | ORAL | Status: DC

## 2016-05-10 MED ORDER — METHYLPREDNISOLONE ACETATE 40 MG/ML IJ SUSP
40.0000 mg | Freq: Once | INTRAMUSCULAR | Status: AC
  Administered 2016-05-10: 40 mg via INTRA_ARTICULAR

## 2016-05-10 NOTE — Patient Instructions (Signed)
1. Refilled pain medicine today  2. Steroid injection in Right shoulder joint - this may make pain worse for 24 hours then improve. Take it easy tonight may use ice pack if needed, then start moving shoulder more tomorrow.  Follow-up with Dr [facility] for your Right Shoulder Arthitis, may also need Physical Therapy or discuss future surgery  If you are interested in quitting smoking - please schedule Pharmacy Clinic Appointment Dr. Valentina Lucks  Please schedule a follow-up appointment in about 6 weeks to meet new doctor and discuss pain management / due for annual blood work (chemistry, kidneys, cholesterol - make a morning apt and fast without eating or drinking after midnight)  If you have any other questions or concerns, please feel free to call the clinic to contact me. You may also schedule an earlier appointment if necessary.  However, if your symptoms get significantly worse, please go to the Emergency Department to seek immediate medical attention.  Todd Hayes, Todd Hayes

## 2016-05-10 NOTE — Assessment & Plan Note (Addendum)
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 Also R-shoulder chronic OA pain - No LBP red flag sypmptoms or radicular symptoms - Self discontinued Gabapentin, Lyrica, intolerance to Tylenol  Plan: 1. Refilled Percocet #180 for 1 month (refills x 3 months printed today) 2. Continue Baclofen, Cymbalta 30mg  daily 3. Advised that he attempt trial off Mobic 7.5mg , he is now stable after prior reduction from 15mg  daily, will consider discontinuing for up to 1 month at a time in future, as opposed to continuous use - Check Chemistry, annual labs next visit 4. RTC 3 months - chronic pain management, future consider trial of amitriptyline (goal to wean down on opiate dose)

## 2016-05-10 NOTE — Assessment & Plan Note (Signed)
See details in overview - Refilled Percocet x 3 months

## 2016-05-10 NOTE — Assessment & Plan Note (Signed)
See details under "Chronic Pain" - Stable without worsening radicular symptoms

## 2016-05-10 NOTE — Progress Notes (Signed)
Subjective:    Patient ID: Todd Hayes, male    DOB: [birth year], 60 y.o.   MRN: [id number]  Todd Hayes is a 60 y.o. 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 Previously improved with last steroid injection 12/2015 at ortho office. REquest repeat injection today. Still chronic LBP, stable today without acute flare. - Continues to take Percocet as prescribed, max 6 tabs daily - Continues to take Baclofen 10mg  1-2 tabs nightly - Continues Cymbalta 30mg  daily - Continues Trazodone 150mg  at night sleep - Denies any fevers/chills, falls or injury, saddle anesthesia, urinary retention or incontinence   Social History  Substance Use Topics  . Smoking status: Current Every Day Smoker -- 1.00 packs/day    Types: Cigarettes  . Smokeless tobacco: None  . Alcohol Use: 0.0 oz/week    0 Standard drinks or equivalent per week    Review of Systems Per HPI unless specifically indicated above     Objective:    BP 122/65 mmHg  Pulse 77  Temp(Src) 98.9 F (37.2 C) (Oral)  Ht 6' (1.829 m)  Wt 209 lb (94.802 kg)  BMI 28.34 kg/m2  Wt Readings from Last 3 Encounters:  05/10/16 209 lb (94.802 kg)  02/13/16 201 lb (91.173 kg)  01/25/16 200 lb (90.719 kg)    Physical Exam  Constitutional: He appears well-developed and well-nourished. No distress.  Well-appearing, comfortable, cooperative  HENT:  Head: Normocephalic and atraumatic.  Mouth/Throat: Oropharynx is clear and moist.  Cardiovascular: Normal rate, regular rhythm, normal heart sounds and intact distal pulses.   No murmur heard. Pulmonary/Chest: Effort normal and breath sounds normal. No respiratory distress.  Musculoskeletal: He  exhibits no edema.  Low Back Inspection: No obvious deformity Palpation: Stable unchanged bilateral lumbar paraspinal muscle spasm without significant +TTP, no spinous process tenderness ROM: full active ROM Strength: 5/5 grip and ankles Neurovascular: distally intact  Right Shoulder Inspection: Normal appearance bilateral symmetrical Palpation: Mild tender to palpation over anterior / lateral ROM: Mostly full AROM forward flexion, somewhat limited internal rotation behind back Special Testing: Rotator cuff testing negative for weakness with supraspinatus full can and empty can test, Hawkin's AC impingement positive for pain Strength: Normal strength 5/5 flex/ext, ext rot / int rot, grip, rotator cuff str testing. Neurovascular: Distally intact pulses, sensation to light touch   Neurological: He is alert.  Skin: Skin is warm and dry. He is not diaphoretic.  Nursing note and vitals reviewed.   ________________________________________________________ PROCEDURE NOTE Date: 05/10/16 Right Shoulder subacromial injection Discussed benefits and risks (including pain, bleeding, infection, steroid flare). Verbal consent given, written consent signed and scanned into record. Medication:  1 cc Depo-medrol 40mg  and 4 cc Lidocaine 1% without epi Time Out taken  Landmarks identified. Area cleansed with alcohol wipes.Using 21 gauge and 1, 1/2 inch needle, Right subacromial bursa space was injected (with above listed medication) via posterior approach cold spray used for superficial anesthetic.Sterile bandage placed.Patient tolerated procedure well without bleeding or paresthesias.No complications.     Assessment & Plan:   Problem List Items Addressed This Visit    Rotator cuff impingement syndrome of right shoulder    Recent worsening R-shoulder impingement, last injection lasted about 2 months. Patient not interested  in follow-up with ortho at this time, not considering surgery - Performed  R-shoulder subacromial steroid injection today, tolerated well, see procedure note - Refill chronic pain meds - Shoulder exercises      Lumbar spondylosis with myelopathy    See details under "Chronic Pain" - Stable without worsening radicular symptoms      Relevant Medications   oxyCODONE-acetaminophen (PERCOCET) 7.5-325 MG tablet   methylPREDNISolone acetate (DEPO-MEDROL) injection 40 mg (Completed)   Encounter for chronic pain management - Primary    See details in overview - Refilled Percocet x 3 months      Chronic pain    Stable, 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 Also R-shoulder chronic OA pain - No LBP red flag sypmptoms or radicular symptoms - Self discontinued Gabapentin, Lyrica, intolerance to Tylenol  Plan: 1. Refilled Percocet #180 for 1 month (refills x 3 months printed today) 2. Continue Baclofen, Cymbalta 30mg  daily 3. Advised that he attempt trial off Mobic 7.5mg , he is now stable after prior reduction from 15mg  daily, will consider discontinuing for up to 1 month at a time in future, as opposed to continuous use - Check Chemistry, annual labs next visit 4. RTC 3 months - chronic pain management, future consider trial of amitriptyline (goal to wean down on opiate dose)      Relevant Medications   oxyCODONE-acetaminophen (PERCOCET) 7.5-325 MG tablet   methylPREDNISolone acetate (DEPO-MEDROL) injection 40 mg (Completed)   Arthritis of right shoulder region    Other Visit Diagnoses    Restless leg        Relevant Medications    rOPINIRole (REQUIP) 0.25 MG tablet       Meds ordered this encounter  Medications  . DISCONTD: oxyCODONE-acetaminophen (PERCOCET) 7.5-325 MG tablet    Sig: Take 1 tablet by mouth every 4 (four) hours as needed.    Dispense:  180 tablet    Refill:  0  . DISCONTD: oxyCODONE-acetaminophen (PERCOCET) 7.5-325 MG tablet    Sig: Take 1 tablet by mouth every 4 (four) hours as needed.    Dispense:  180 tablet    Refill:  0     Do not fill before 06/09/2016  . oxyCODONE-acetaminophen (PERCOCET) 7.5-325 MG tablet    Sig: Take 1 tablet by mouth every 4 (four) hours as needed.    Dispense:  180 tablet    Refill:  0    Do not fill before 07/10/2016  . methylPREDNISolone acetate (DEPO-MEDROL) injection 40 mg    Sig:   . rOPINIRole (REQUIP) 0.25 MG tablet    Sig: Take 2 tablets (0.5 mg total) by mouth at bedtime.    Dispense:  60 tablet    Refill:  5      Follow up plan: Return in about 6 weeks (around 06/21/2016) for chronic pain / annual labs / meet new PCP.    [name], [place], PGY-3

## 2016-05-10 NOTE — Assessment & Plan Note (Signed)
Recent worsening R-shoulder impingement, last injection lasted about 2 months. Patient not interested in follow-up with ortho at this time, not considering surgery - Performed R-shoulder subacromial steroid injection today, tolerated well, see procedure note - Refill chronic pain meds - Shoulder exercises

## 2016-06-11 ENCOUNTER — Other Ambulatory Visit: Payer: Self-pay | Admitting: *Deleted

## 2016-06-11 DIAGNOSIS — G8929 Other chronic pain: Secondary | ICD-10-CM

## 2016-06-11 DIAGNOSIS — G2581 Restless legs syndrome: Secondary | ICD-10-CM

## 2016-06-11 DIAGNOSIS — M4716 Other spondylosis with myelopathy, lumbar region: Secondary | ICD-10-CM

## 2016-06-11 DIAGNOSIS — S46811D Strain of other muscles, fascia and tendons at shoulder and upper arm level, right arm, subsequent encounter: Secondary | ICD-10-CM

## 2016-06-11 DIAGNOSIS — I10 Essential (primary) hypertension: Secondary | ICD-10-CM

## 2016-06-11 DIAGNOSIS — K219 Gastro-esophageal reflux disease without esophagitis: Secondary | ICD-10-CM

## 2016-06-11 MED ORDER — BACLOFEN 10 MG PO TABS: 5.0000 mg | ORAL_TABLET | Freq: Three times a day (TID) | ORAL | 1 refills | Status: DC | PRN

## 2016-06-11 MED ORDER — DULOXETINE HCL 30 MG PO CPEP: 30.0000 mg | ORAL_CAPSULE | Freq: Every day | ORAL | 1 refills | Status: DC

## 2016-06-11 MED ORDER — ROPINIROLE HCL 0.25 MG PO TABS: 0.5000 mg | ORAL_TABLET | Freq: Every day | ORAL | 5 refills | Status: DC

## 2016-06-11 MED ORDER — PANTOPRAZOLE SODIUM 20 MG PO TBEC: 20.0000 mg | DELAYED_RELEASE_TABLET | Freq: Every day | ORAL | 3 refills | Status: DC

## 2016-06-11 MED ORDER — LISINOPRIL 20 MG PO TABS: 10.0000 mg | ORAL_TABLET | Freq: Every day | ORAL | 5 refills | Status: DC

## 2016-06-11 NOTE — Telephone Encounter (Signed)
Patient's chronic pain previously managed by Dr. Parks Ranger. Per Dr. Parks Ranger' last night, patient to RTC 3 months after previous visit, so should be seen in September. Will refill baclofen and Cymbalta for two additional months, but will not refill after that until patient is seen.   Todd Hector, MD, MPH PGY-2 [facility] Pager [phone]

## 2016-06-22 NOTE — Telephone Encounter (Signed)
Patient informed of message from MD, Appointment scheduled for 9/18 with PCP.

## 2016-07-06 ENCOUNTER — Encounter: Payer: Self-pay | Admitting: *Deleted

## 2016-07-06 NOTE — Progress Notes (Signed)
   DOS 01-25-16 Exostectomy 5th toe right

## 2016-07-10 ENCOUNTER — Other Ambulatory Visit: Payer: Self-pay | Admitting: Internal Medicine

## 2016-07-10 DIAGNOSIS — G4700 Insomnia, unspecified: Secondary | ICD-10-CM

## 2016-07-10 NOTE — Telephone Encounter (Signed)
Needs refill on trazadone.  [facility]

## 2016-07-11 MED ORDER — TRAZODONE HCL 150 MG PO TABS: 300.0000 mg | ORAL_TABLET | Freq: Every day | ORAL | 2 refills | Status: DC

## 2016-07-30 ENCOUNTER — Encounter: Payer: Self-pay | Admitting: Internal Medicine

## 2016-07-30 ENCOUNTER — Ambulatory Visit (INDEPENDENT_AMBULATORY_CARE_PROVIDER_SITE_OTHER): Payer: Medicare Other | Admitting: Internal Medicine

## 2016-07-30 DIAGNOSIS — I10 Essential (primary) hypertension: Secondary | ICD-10-CM

## 2016-07-30 DIAGNOSIS — Z720 Tobacco use: Secondary | ICD-10-CM

## 2016-07-30 DIAGNOSIS — S46811D Strain of other muscles, fascia and tendons at shoulder and upper arm level, right arm, subsequent encounter: Secondary | ICD-10-CM

## 2016-07-30 DIAGNOSIS — K219 Gastro-esophageal reflux disease without esophagitis: Secondary | ICD-10-CM | POA: Diagnosis not present

## 2016-07-30 DIAGNOSIS — E785 Hyperlipidemia, unspecified: Secondary | ICD-10-CM

## 2016-07-30 DIAGNOSIS — G8929 Other chronic pain: Secondary | ICD-10-CM | POA: Diagnosis not present

## 2016-07-30 DIAGNOSIS — G253 Myoclonus: Secondary | ICD-10-CM

## 2016-07-30 DIAGNOSIS — M4716 Other spondylosis with myelopathy, lumbar region: Secondary | ICD-10-CM

## 2016-07-30 DIAGNOSIS — J309 Allergic rhinitis, unspecified: Secondary | ICD-10-CM

## 2016-07-30 DIAGNOSIS — G4700 Insomnia, unspecified: Secondary | ICD-10-CM | POA: Diagnosis not present

## 2016-07-30 DIAGNOSIS — G2581 Restless legs syndrome: Secondary | ICD-10-CM | POA: Diagnosis not present

## 2016-07-30 MED ORDER — TRAZODONE HCL 150 MG PO TABS: 300.0000 mg | ORAL_TABLET | Freq: Every day | ORAL | 0 refills | Status: DC

## 2016-07-30 MED ORDER — MELOXICAM 7.5 MG PO TABS: 7.5000 mg | ORAL_TABLET | Freq: Every day | ORAL | 0 refills | Status: DC

## 2016-07-30 MED ORDER — ROPINIROLE HCL 0.25 MG PO TABS: 0.5000 mg | ORAL_TABLET | Freq: Every day | ORAL | 11 refills | Status: DC

## 2016-07-30 MED ORDER — BACLOFEN 10 MG PO TABS: 5.0000 mg | ORAL_TABLET | Freq: Three times a day (TID) | ORAL | 0 refills | Status: DC | PRN

## 2016-07-30 MED ORDER — OXYCODONE-ACETAMINOPHEN 7.5-325 MG PO TABS: 1.0000 | ORAL_TABLET | ORAL | 0 refills | Status: DC | PRN

## 2016-07-30 MED ORDER — DULOXETINE HCL 30 MG PO CPEP: 30.0000 mg | ORAL_CAPSULE | Freq: Every day | ORAL | 11 refills | Status: DC

## 2016-07-30 MED ORDER — LISINOPRIL 20 MG PO TABS: 10.0000 mg | ORAL_TABLET | Freq: Every day | ORAL | 11 refills | Status: DC

## 2016-07-30 MED ORDER — FLUTICASONE PROPIONATE 50 MCG/ACT NA SUSP: 1.0000 | Freq: Every day | NASAL | 11 refills | Status: DC

## 2016-07-30 MED ORDER — PANTOPRAZOLE SODIUM 20 MG PO TBEC
20.0000 mg | DELAYED_RELEASE_TABLET | Freq: Every day | ORAL | 4 refills | Status: DC
Start: 2016-07-30 — End: 2017-08-06

## 2016-07-30 NOTE — Progress Notes (Signed)
Subjective:    Patient ID: Todd Hayes, male    DOB: [birth year], 60 y.o.   MRN: [id number]  HPI  Shoulder pain Reports bone spur on R shoulder. Has received shoulder injections before. Saw ortho once about 6 months ago, but says he does not want to go back. Currently taking 6 Percocet 7.5-325mg  per day, as well as Mobic 7.5mg , Cymbalta, Trazodone every night, and Baclofen at night. Says he does some shoulder exercises (lifting 8 pound weights) but this does not help with pain. Has been to PT in the past and said it does not help. Is not interested in decreasing pain meds. Says pain is worse with certain movements.   Foot pain Says due to four broken toes in the past as well as bone spurs. Is followed by podiatry, and recently had bone spur on L fifth toe removed. Reports pain improved now. Says pain is worse at end of day after he has been walking a lot. Currently walks 0.5 mi every morning in addition to gardening. Reports that this aggravates his foot, but he continues to do these things.   Back/leg pain Describes as nerve pain. Taking Cymbalta and Requip. Denies weakness.   HTN Taking lisinopril. Does not measure BP at home.   Continues to smoke 1 pack of cigarettes per day.   Review of Systems See HPI.     Objective:   Physical Exam  Constitutional: He is oriented to person, place, and time. He appears well-developed and well-nourished. No distress.  HENT:  Head: Normocephalic and atraumatic.  Cardiovascular: Normal rate, regular rhythm and normal heart sounds.   No murmur heard. Pulmonary/Chest: No respiratory distress. He has wheezes (Primarily on R).  Musculoskeletal:  Full ROM of R shoulder. 5/5 strength upper extremities bilaterally. No TTP of R shoulder.   Neurological: He is alert and oriented to person, place, and time.  Psychiatric: He has a normal mood and affect. His behavior is normal.  Vitals reviewed.     Assessment & Plan:  Chronic pain Currently  taking: - 6 Percocet 7.5-325mg   - Mobic 7.5mg  qd - Cymbalta 30 mg qd - Baclofen 10mg  qhs - 1-2 trazodone 150mg  qhs  Discussed at length that continuing these pain meds, especially Percocet and trazodone, is not an option. Assured patient that we will wean medications slowly, but was clear that we will not continue them indefinitely. Instructed patient to call ortho as soon as he got home today to schedule an appointment to discuss his shoulder pain. Discussed with patient that by giving pain meds but not addressing the underlying cause of the pain, we are not providing him the best care. Told patient that he has a month to schedule appointment with ortho and I would continue his current pain regimen until then, but after this appointment we will begin to wean his medications, so he should create a plan with ortho to address his shoulder pain, whether that be continued injections or surgery. Patient initially angered, stating that if he does not get his pain meds he will see another doctor, but eventually became calmer and agreed to plan.  - Refilled pain meds at current dose for one month only - Will begin to wean meds at next visit in one month, regardless of whether patient has followed up with ortho or not  HTN (hypertension) Well-controlled on current dose of lisinopril. BP in office 135/73.  - Continue lisinopril 20mg  qd  Lumbar spondylosis with myelopathy Continue Cymbalta  Myoclonic jerking  Continue Requip  Tobacco abuse Still smoking 1 ppd. Wheezing on lung exam. Discussed that COPD will continue to worsen if patient continues to smoke. He said "I know" but does not wish to stop smoking at this time.   Adin Hector, MD, MPH PGY-2 [facility] Pager [phone]

## 2016-07-30 NOTE — Assessment & Plan Note (Signed)
Continue Requip. 

## 2016-07-30 NOTE — Assessment & Plan Note (Signed)
Well-controlled on current dose of lisinopril. BP in office 135/73.  - Continue lisinopril 20mg  qd

## 2016-07-30 NOTE — Assessment & Plan Note (Signed)
Currently taking: - 6 Percocet 7.5-325mg   - Mobic 7.5mg  qd - Cymbalta 30 mg qd - Baclofen 10mg  qhs - 1-2 trazodone 150mg  qhs  Discussed at length that continuing these pain meds, especially Percocet and trazodone, is not an option. Assured patient that we will wean medications slowly, but was clear that we will not continue them indefinitely. Instructed patient to call ortho as soon as he got home today to schedule an appointment to discuss his shoulder pain. Discussed with patient that by giving pain meds but not addressing the underlying cause of the pain, we are not providing him the best care. Told patient that he has a month to schedule appointment with ortho and I would continue his current pain regimen until then, but after this appointment we will begin to wean his medications, so he should create a plan with ortho to address his shoulder pain, whether that be continued injections or surgery. Patient initially angered, stating that if he does not get his pain meds he will see another doctor, but eventually became calmer and agreed to plan.  - Refilled pain meds at current dose for one month only - Will begin to wean meds at next visit in one month, regardless of whether patient has followed up with ortho or not

## 2016-07-30 NOTE — Assessment & Plan Note (Signed)
Still smoking 1 ppd. Wheezing on lung exam. Discussed that COPD will continue to worsen if patient continues to smoke. He said "I know" but does not wish to stop smoking at this time.

## 2016-07-30 NOTE — Patient Instructions (Addendum)
It was nice meeting you today Mr. Quigg.  It is VERY important to call your orthopedic doctor as soon as you get home today. I have written a one month refill of your pain medications, however we will start to wean these medications next month, so it is important that you have a plan with your orthopedic doctor to help relieve your pain.   I have given you 11 refills of your other medications.   I will see you back in one month to address your pain again.   If you have any questions or concerns, please feel free to call the clinic.   Be well,  Dr. Avon Gully

## 2016-07-30 NOTE — Assessment & Plan Note (Signed)
Continue Cymbalta.

## 2016-09-03 ENCOUNTER — Encounter: Payer: Self-pay | Admitting: Internal Medicine

## 2016-09-03 ENCOUNTER — Ambulatory Visit (INDEPENDENT_AMBULATORY_CARE_PROVIDER_SITE_OTHER): Payer: Medicare Other | Admitting: Internal Medicine

## 2016-09-03 DIAGNOSIS — S46811D Strain of other muscles, fascia and tendons at shoulder and upper arm level, right arm, subsequent encounter: Secondary | ICD-10-CM | POA: Diagnosis not present

## 2016-09-03 DIAGNOSIS — G8929 Other chronic pain: Secondary | ICD-10-CM

## 2016-09-03 DIAGNOSIS — M4716 Other spondylosis with myelopathy, lumbar region: Secondary | ICD-10-CM

## 2016-09-03 DIAGNOSIS — G894 Chronic pain syndrome: Secondary | ICD-10-CM | POA: Diagnosis not present

## 2016-09-03 MED ORDER — BACLOFEN 10 MG PO TABS: 10.0000 mg | ORAL_TABLET | Freq: Every day | ORAL | 5 refills | Status: DC | PRN

## 2016-09-03 MED ORDER — MELOXICAM 7.5 MG PO TABS: 7.5000 mg | ORAL_TABLET | Freq: Every day | ORAL | 5 refills | Status: DC

## 2016-09-03 MED ORDER — OXYCODONE-ACETAMINOPHEN 7.5-325 MG PO TABS: 1.0000 | ORAL_TABLET | Freq: Four times a day (QID) | ORAL | 0 refills | Status: DC | PRN

## 2016-09-03 MED ORDER — GABAPENTIN 100 MG PO CAPS: 100.0000 mg | ORAL_CAPSULE | Freq: Three times a day (TID) | ORAL | 3 refills | Status: DC

## 2016-09-03 NOTE — Progress Notes (Signed)
   Subjective:    Patient ID: TREI BLAICH, male    DOB: 04-24-1956, 60 y.o.   MRN: [id number]  HPI  Patient presents for chronic pain f/u.  Chronic pain Patient reports continued pain in shoulder and feet. Says he did schedule appt with ortho for shoulder pain, and will be seen there in three days. Is hoping for shoulder surgery. Also reports continued pain in his feet. Has not seen podiatrist recently. Reports that now he has burning pain in his feet as well as his usual chronic pain. Is able to walk without difficulty.   Patient continues to smoke despite COPD diagnosis and counseling.    Review of Systems See HPI.     Objective:   Physical Exam  Constitutional: He is oriented to person, place, and time. He appears well-developed and well-nourished. No distress.  HENT:  Head: Normocephalic and atraumatic.  Eyes: EOM are normal.  Cardiovascular: Normal rate, regular rhythm and normal heart sounds.   No murmur heard. Pulmonary/Chest: Effort normal. No respiratory distress.  Neurological: He is alert and oriented to person, place, and time.  Psychiatric: He has a normal mood and affect. His behavior is normal.      Assessment & Plan:  Chronic pain Currently taking: - 6 Percocet 7.5-325mg  - Mobic 7.5mg  qd - Cymbalta 30mg  qd - Baclofen 10mg  qhs - 1-2 trazodone 150mg  qhs  Patient requesting refills for Mobic, baclofen, and percocet. Reminded patient that at last visit we discussed that his pain medication would be weaned at next visit. Patient very agitated when discussing this. Reminded patient that he was told he had one month to come up with a plan with ortho regarding long-term pain management. Praised patient for scheduling appointment with ortho, but said that because he has not yet been seen is not a reason for changing narcotics plan. Will begin weaning narcotics today.  - Refilled Mobic 7.5mg  qd - Refilled baclofen, but only 10mg  #30 - Decreased dose of Percocet from  q4 to q6. Will decrease to q8 at next visit, or not prescribe at all if ortho presribes pain meds - Not requesting refills of Cymbalta or trazodone at this time - Will begin gabapentin for foot pain, and recommended following up with podiatrist again  Adin Hector, MD, MPH PGY-2 [facility] Pager [phone]

## 2016-09-03 NOTE — Patient Instructions (Signed)
It was nice seeing you again today Todd Hayes.  It is very important to go to your appointment with your orthopedic surgeon this week to come up with a long-term plan for your shoulder pain.   For the nerve pain in your feet, you can begin taking gabapentin 100 mg. Start by taking it two times a day. If you are not too sleepy after this it for about a week, you can increase this to three times a day if you are still having pain.   If you have any questions or concerns, please feel free to call the clinic.   Be well,  Dr. Avon Gully

## 2016-09-03 NOTE — Assessment & Plan Note (Signed)
Currently taking: - 6 Percocet 7.5-325mg  - Mobic 7.5mg  qd - Cymbalta 30mg  qd - Baclofen 10mg  qhs - 1-2 trazodone 150mg  qhs  Patient requesting refills for Mobic, baclofen, and percocet. Reminded patient that at last visit we discussed that his pain medication would be weaned at next visit. Patient very agitated when discussing this. Reminded patient that he was told he had one month to come up with a plan with ortho regarding long-term pain management. Praised patient for scheduling appointment with ortho, but said that because he has not yet been seen is not a reason for changing narcotics plan. Will begin weaning narcotics today.  - Refilled Mobic 7.5mg  qd - Refilled baclofen, but only 10mg  #30 - Decreased dose of Percocet from q4 to q6. Will decrease to q8 at next visit, or not prescribe at all if ortho presribes pain meds - Not requesting refills of Cymbalta or trazodone at this time - Will begin gabapentin for foot pain, and recommended following up with podiatrist again

## 2016-09-24 DIAGNOSIS — M7541 Impingement syndrome of right shoulder: Secondary | ICD-10-CM | POA: Diagnosis not present

## 2016-09-24 DIAGNOSIS — M7521 Bicipital tendinitis, right shoulder: Secondary | ICD-10-CM | POA: Diagnosis not present

## 2016-09-24 DIAGNOSIS — M25511 Pain in right shoulder: Secondary | ICD-10-CM | POA: Diagnosis not present

## 2016-09-24 DIAGNOSIS — M19011 Primary osteoarthritis, right shoulder: Secondary | ICD-10-CM | POA: Diagnosis not present

## 2016-10-03 ENCOUNTER — Ambulatory Visit (INDEPENDENT_AMBULATORY_CARE_PROVIDER_SITE_OTHER): Payer: Medicare Other | Admitting: Internal Medicine

## 2016-10-03 ENCOUNTER — Encounter: Payer: Self-pay | Admitting: Internal Medicine

## 2016-10-03 DIAGNOSIS — G4709 Other insomnia: Secondary | ICD-10-CM

## 2016-10-03 DIAGNOSIS — M4716 Other spondylosis with myelopathy, lumbar region: Secondary | ICD-10-CM

## 2016-10-03 DIAGNOSIS — G8929 Other chronic pain: Secondary | ICD-10-CM | POA: Diagnosis not present

## 2016-10-03 MED ORDER — OXYCODONE-ACETAMINOPHEN 7.5-325 MG PO TABS: 1.0000 | ORAL_TABLET | Freq: Four times a day (QID) | ORAL | 0 refills | Status: DC | PRN

## 2016-10-03 MED ORDER — TRAZODONE HCL 150 MG PO TABS: 300.0000 mg | ORAL_TABLET | Freq: Every day | ORAL | 0 refills | Status: DC

## 2016-10-03 NOTE — Progress Notes (Signed)
   Subjective:    Patient ID: Todd Hayes, male    DOB: 04-21-1956, 60 y.o.   MRN: [id number]  HPI  Patient presents for pain med refill.   Patient reporting pain "all over" and says it is the same as last visit. Says pain is worst in feet today. Describes numbness and burning in his feet, which he attributes to prior toe fractures and bone spurs. Says he has to sleep with socks on at night because the bed sheets cause pain. Patient prescribed gabapentin at last appointment on 10/23, but stopped taking after 4 days because he said it made his knee nerves burn. Did not see podiatrist as recommended. Patient did see ortho and received cortisone injection in his shoulder about two weeks ago. Says he was referred by ortho to ortho surgery and has an appointment on Dec 4. Reports that his pain was a little better for a couple days after the injection, but has since returned.  Patient currently taking Mobic 7.5mg  qd, baclofen 10mg  qd, and Percocet 7.5-325mg  q4. Is not taking q6 as prescribed at last visit. As such, patient ran out of Percocet prior to this appointment.   Smoking status reviewed. Patient continues to smoke despite COPD diagnosis and counseling.   Review of Systems See HPI.     Objective:   Physical Exam  Constitutional: He is oriented to person, place, and time. He appears well-developed and well-nourished. No distress.  HENT:  Head: Normocephalic and atraumatic.  Eyes: Conjunctivae and EOM are normal. Right eye exhibits no discharge. Left eye exhibits no discharge.  Pulmonary/Chest: Effort normal. No respiratory distress.  Neurological: He is alert and oriented to person, place, and time.  Psychiatric:  Intermittent agitation throughout encounter primarily when discussing pain medication      Assessment & Plan:  Chronic pain Reporting continued pain "all over" with numbness and burning in feet. Currently taking Mobic 7.5mg  qd, baclofen 10mg  qd, and Percocet 7.5-325mg . Is  taking Percocet q4 despite being prescribed q6. Self-discontinued gabapentin. Reports Lyrica made his feet numb initially so does not was to resume. Refusing Cymbalta as well. Patient with ortho surgery appointment on Dec 4. Refusing to see podiatry regarding reported bone spurs. Explained to patient that we will continue current medication regimen this month, but he needs to come up with a plan with ortho surg. Also strongly recommended he see podiatry, though he says he is not going to.  - Continue Mobic 7.5mg  qd - Continue baclofen 10mg  qd - Refilled Percocet 7.5-325mg  #120 pills q6 PRN. Decrease to q8 at next appointment.   Of note, patient cursing throughout appointment, referring to me as "that damn doctor" when talking to CNA. Explained to patient once again that increasing narcotics is not an option, and that he needs to see ortho surg and podiatry to come up with long-term solution.   Adin Hector, MD, MPH PGY-2 [facility] Pager [phone]

## 2016-10-03 NOTE — Assessment & Plan Note (Signed)
Reporting continued pain "all over" with numbness and burning in feet. Currently taking Mobic 7.5mg  qd, baclofen 10mg  qd, and Percocet 7.5-325mg . Is taking Percocet q4 despite being prescribed q6. Self-discontinued gabapentin. Reports Lyrica made his feet numb initially so does not was to resume. Refusing Cymbalta as well. Patient with ortho surgery appointment on Dec 4. Refusing to see podiatry regarding reported bone spurs. Explained to patient that we will continue current medication regimen this month, but he needs to come up with a plan with ortho surg. Also strongly recommended he see podiatry, though he says he is not going to.  - Continue Mobic 7.5mg  qd - Continue baclofen 10mg  qd - Refilled Percocet 7.5-325mg  #120 pills q6 PRN. Decrease to q8 at next appointment.   Of note, patient cursing throughout appointment, referring to me as "that damn doctor" when talking to CNA. Explained to patient once again that increasing narcotics is not an option, and that he needs to see ortho surg and podiatry to come up with long-term solution.

## 2016-10-03 NOTE — Patient Instructions (Addendum)
It was nice seeing you again today Todd Hayes.  I have refilled your trazodone and Percocet today. You are to take percocet only every SIX HOURS AS NEEDED for pain. At your next appointment, we will decrease your medication to every eight hours.   It is VERY IMPORTANT to keep your appointment with the orthopedic surgeon on December 4. I also strongly recommend scheduling an appointment with a podiatrist (foot doctor) as soon as possible.   If you have any questions or concerns, please feel free to call the clinic.   Be well,  Dr. Avon Gully

## 2016-10-15 DIAGNOSIS — M19011 Primary osteoarthritis, right shoulder: Secondary | ICD-10-CM | POA: Diagnosis not present

## 2016-10-22 DIAGNOSIS — M19011 Primary osteoarthritis, right shoulder: Secondary | ICD-10-CM | POA: Diagnosis not present

## 2016-10-30 ENCOUNTER — Ambulatory Visit (INDEPENDENT_AMBULATORY_CARE_PROVIDER_SITE_OTHER): Payer: Medicare Other | Admitting: Internal Medicine

## 2016-10-30 ENCOUNTER — Encounter: Payer: Self-pay | Admitting: Internal Medicine

## 2016-10-30 DIAGNOSIS — M4716 Other spondylosis with myelopathy, lumbar region: Secondary | ICD-10-CM | POA: Diagnosis not present

## 2016-10-30 DIAGNOSIS — G8929 Other chronic pain: Secondary | ICD-10-CM

## 2016-10-30 MED ORDER — OXYCODONE-ACETAMINOPHEN 7.5-325 MG PO TABS: 1.0000 | ORAL_TABLET | Freq: Three times a day (TID) | ORAL | 0 refills | Status: DC | PRN

## 2016-10-30 NOTE — Assessment & Plan Note (Signed)
Continued pain primarily in shoulders and persistent numbness and burning in feet. Currently taking Mobic 7.5mg  qd, baclofen 10 mg qd, and Percocet 7.5-325mg  q6 (sometimes q4). Reports he is taking Cymbalta, though has denied taking it in the past. Still refusing Lyrica (he says this is the cause of his foot numbness) and gabapentin. Did follow up with ortho, but did not see ortho surg reportedly due to copay issues. Has appt with ortho surg on Jan 8. Still refusing to see podiatry despite acknowledging that his pain is due to bone spurs (what he has been told in the past). Will increase Mobic to 15mg  qd, but will not increase baclofen to BID. Patient displeased with this. Explained to patient that we are continuing to wean his narcotics, and will do so today. Discussed once again that he needs to come up with a long-term plan with ortho. As patient did see ortho, he seems to be making some progress towards this goal, however did not see ortho surg. Explained that it is imperative that he see ortho surg, as we are continuing to wean his pain meds and will do so regardless of his status with ortho surg.  - Increase Mobic to 15mg  qd - Continue baclofen 10mg  qd - Refilled Percocet 7.5-325mg  #90 pills q8 PRN. Will decrease to BID at next appointment. Informed patient of this.

## 2016-10-30 NOTE — Patient Instructions (Addendum)
It was nice seeing you again today Mr. Facemire.   You can begin taking two meloxicam tablets (15 mg total) once a day. Continue to take baclofen 10 mg once a day.  We have decreased your Percocet to one tablet up to every eight hours as needed.   I continue to recommend that you make an appointment with a podiatrist. Please keep your appointment with the orthopedic surgeon on January 8 to come up with a long term plan for your pain. We will continue to wean your narcotic pain medications, so it is important for you to have a plan.   If you have any questions or concerns, please feel free to call the clinic.   Be well,  Dr. Avon Gully

## 2016-10-30 NOTE — Progress Notes (Signed)
   Subjective:   Patient: Todd Hayes       Birthdate: 05/28/1956       MRN: [id number]      HPI  Todd Hayes is a 60 y.o. male presenting for refill of pain meds.   Patient reports same pain as at prior visits, primarily in shoulders and feet. Reports same numbness and pain in his toes as prior. Still has not returned to podiatrist as recommended. Patient did seen an orthopedist on December 4, who ordered and MRI and referred to him to ortho surgery (Dr. Kelle Darting). Patient had MRI performed, but was not seen by Dr. Kelle Darting because he could not afford the $35 copay. He has another appointment on Jan 8, and says he thinks he will be able to afford this, as he gets paid on Jan 3.  Patient wanting to increase baclofen to BID today, and increase meloxicam as well. He is taking Percocet at least every 6 hours, sometimes every 4 hours. He is taking meloxicam 7.5mg  qd and baclofen 10mg  qd now.  Denies numbness or weakness of lower extremities (excepting preexisting numbness of toes), saddle anesthesia, or bowel or bladder incontinence.   Smoking status reviewed.   Review of Systems See HPI.     Objective:  Physical Exam  Constitutional: He is oriented to person, place, and time and well-developed, well-nourished, and in no distress.  HENT:  Head: Normocephalic and atraumatic.  Eyes: Conjunctivae and EOM are normal. Right eye exhibits no discharge. Left eye exhibits no discharge.  Pulmonary/Chest: Effort normal. No respiratory distress.  Musculoskeletal:  No gait abnormalities. Able to sit in chair and stand without difficulty unassisted. Good ROM of upper extremities with no weakness.   Neurological: He is alert and oriented to person, place, and time.  Psychiatric:  Disgruntled intermittently when discussing pain medication     Assessment & Plan:  Chronic pain Continued pain primarily in shoulders and persistent numbness and burning in feet. Currently taking Mobic 7.5mg  qd, baclofen  10 mg qd, and Percocet 7.5-325mg  q6 (sometimes q4). Reports he is taking Cymbalta, though has denied taking it in the past. Still refusing Lyrica (he says this is the cause of his foot numbness) and gabapentin. Did follow up with ortho, but did not see ortho surg reportedly due to copay issues. Has appt with ortho surg on Jan 8. Still refusing to see podiatry despite acknowledging that his pain is due to bone spurs (what he has been told in the past). Will increase Mobic to 15mg  qd, but will not increase baclofen to BID. Patient displeased with this. Explained to patient that we are continuing to wean his narcotics, and will do so today. Discussed once again that he needs to come up with a long-term plan with ortho. As patient did see ortho, he seems to be making some progress towards this goal, however did not see ortho surg. Explained that it is imperative that he see ortho surg, as we are continuing to wean his pain meds and will do so regardless of his status with ortho surg.  - Increase Mobic to 15mg  qd - Continue baclofen 10mg  qd - Refilled Percocet 7.5-325mg  #90 pills q8 PRN. Will decrease to BID at next appointment. Informed patient of this.    Adin Hector, MD, MPH PGY-2 [facility] Pager [phone]

## 2016-11-19 DIAGNOSIS — M75101 Unspecified rotator cuff tear or rupture of right shoulder, not specified as traumatic: Secondary | ICD-10-CM | POA: Diagnosis not present

## 2016-11-19 DIAGNOSIS — M19011 Primary osteoarthritis, right shoulder: Secondary | ICD-10-CM | POA: Diagnosis not present

## 2016-11-21 ENCOUNTER — Ambulatory Visit (INDEPENDENT_AMBULATORY_CARE_PROVIDER_SITE_OTHER): Payer: Medicare Other | Admitting: Family Medicine

## 2016-11-21 ENCOUNTER — Encounter: Payer: Self-pay | Admitting: Family Medicine

## 2016-11-21 DIAGNOSIS — G4709 Other insomnia: Secondary | ICD-10-CM

## 2016-11-21 DIAGNOSIS — I10 Essential (primary) hypertension: Secondary | ICD-10-CM

## 2016-11-21 DIAGNOSIS — G8929 Other chronic pain: Secondary | ICD-10-CM | POA: Diagnosis not present

## 2016-11-21 MED ORDER — TRAZODONE HCL 150 MG PO TABS: 300.0000 mg | ORAL_TABLET | Freq: Every day | ORAL | 0 refills | Status: DC

## 2016-11-21 MED ORDER — LISINOPRIL 20 MG PO TABS: 10.0000 mg | ORAL_TABLET | Freq: Every day | ORAL | 11 refills | Status: DC

## 2016-11-21 NOTE — Assessment & Plan Note (Addendum)
Chronic. Patient requesting refills of Percocet. Was seen on 10/30/2016 and given 1 month supply which was filled 11/06/2016. Dose was reduced from q6h to q8h. Patient to see orthopedics for surgery repair of right shoulder. Should have enough Percocet to last through surgery. Patient frustrated with recent wean on dose --Patient instructed to call clinic after surgery on 12/05/2016 to f/u in clinic for refills of medications --Will continue Percocet at q8h for chronic pain --Consider UDS in near future, last done 11/19/2015

## 2016-11-21 NOTE — Progress Notes (Signed)
   Subjective:   Patient ID: Todd Hayes    DOB: [birth year], 61 y.o. male   MRN: [id number]  CC: "prescription refill"  HPI: Todd Hayes is a 61 y.o. male who presents to clinic today for refills on his presciptions. Problems discussed today are as follows:  Prescription refill: patient says he was seen by another provider last month and was upset at the recent change in his Percocet. He was hoping to receive a refill given his upcoming surgery on 12/05/2016. Says his pain is mainly on his right shoulder but has neuropathy pain and arthritis in several joints. Wants to go back to q8h and says q6h is not enough to get through the day.  ROS: complete ROS performed, see HPI for pertinent ROS.  [place]: rotator cuff impingement syndrome of right shoulder, tobacco abuse, GERD, COPD, arthritis, chronic pain narcotic use. Smoking status reviewed. Medications reviewed.  Objective:   BP 119/80 (BP Location: Left Arm, Patient Position: Sitting, Cuff Size: Large)   Pulse 89   Temp 98.3 F (36.8 C) (Oral)   Ht 6' (1.829 m)   Wt 215 lb 12.8 oz (97.9 kg)   SpO2 93%   BMI 29.27 kg/m  Vitals and nursing note reviewed.  General: well nourished, well developed, in no acute distress with non-toxic appearance HEENT: normocephalic, atraumatic, moist mucous membranes CV: regular rate and rhythm without murmurs, rubs, or gallops, no lower extremity edema Lungs: clear to auscultation bilaterally with normal work of breathing Abdomen: soft, non-tender, non-distended, no masses or organomegaly palpable, normoactive bowel sounds Skin: warm, dry, no rashes or lesions, cap refill < 2 seconds Extremities: warm and well perfused, normal tone, right shoulder tender on palpation with 90% ROM intact and 5/5 motor strength  Assessment & Plan:   Encounter for chronic pain management Chronic. Patient requesting refills of Percocet. Was seen on 10/30/2016 and given 1 month supply which was filled 11/06/2016.  Dose was reduced from q6h to q8h. Patient to see orthopedics for surgery repair of right shoulder. Should have enough Percocet to last through surgery. Patient frustrated with recent wean on dose --Patient instructed to call clinic after surgery on 12/05/2016 to f/u in clinic for refills of medications --Will continue Percocet at q8h for chronic pain --Consider UDS in near future, last done 11/19/2015  No orders of the defined types were placed in this encounter.  Meds ordered this encounter  Medications  . traZODone (DESYREL) 150 MG tablet    Sig: Take 2 tablets (300 mg total) by mouth at bedtime.    Dispense:  60 tablet    Refill:  0  . lisinopril (PRINIVIL,ZESTRIL) 20 MG tablet    Sig: Take 0.5 tablets (10 mg total) by mouth daily.    Dispense:  30 tablet    Refill:  [place], [place], PGY-1 11/21/2016 8:25 PM

## 2016-11-21 NOTE — Patient Instructions (Signed)
It was a pleasure to meet you today. Please see below to review our plan for today's visit.  1. You should have enough Percocet to get you through the surgery on 1/24. The ortho doctor will give you pain meds after the surgery. 2. I want you to return in 1 month to go over you chronic medical problems. 3. I sent in refills to your pharmacy for trazodone and lisinopril.  Please call the clinic at [phone] if your symptoms worsen or you have any concerns. It was my pleasure to see you. -- Harriet Butte, [place], PGY-1

## 2016-12-05 DIAGNOSIS — M75121 Complete rotator cuff tear or rupture of right shoulder, not specified as traumatic: Secondary | ICD-10-CM | POA: Diagnosis not present

## 2016-12-05 DIAGNOSIS — M7541 Impingement syndrome of right shoulder: Secondary | ICD-10-CM | POA: Diagnosis not present

## 2016-12-05 DIAGNOSIS — G8918 Other acute postprocedural pain: Secondary | ICD-10-CM | POA: Diagnosis not present

## 2016-12-18 ENCOUNTER — Telehealth: Payer: Self-pay | Admitting: Family Medicine

## 2016-12-18 NOTE — Telephone Encounter (Signed)
Pt has appt March 2 but he will run out his percocet and trazadone before that appt.  Please let him know when he can pick up the Rx for these

## 2016-12-20 DIAGNOSIS — M75101 Unspecified rotator cuff tear or rupture of right shoulder, not specified as traumatic: Secondary | ICD-10-CM | POA: Diagnosis not present

## 2016-12-20 DIAGNOSIS — M19011 Primary osteoarthritis, right shoulder: Secondary | ICD-10-CM | POA: Diagnosis not present

## 2016-12-20 DIAGNOSIS — Z9889 Other specified postprocedural states: Secondary | ICD-10-CM | POA: Diagnosis not present

## 2016-12-21 ENCOUNTER — Telehealth: Payer: Self-pay | Admitting: Family Medicine

## 2016-12-21 MED ORDER — OXYCODONE-ACETAMINOPHEN 7.5-325 MG PO TABS: 1.0000 | ORAL_TABLET | Freq: Three times a day (TID) | ORAL | 0 refills | Status: DC | PRN

## 2016-12-21 MED ORDER — TRAZODONE HCL 150 MG PO TABS: ORAL_TABLET | ORAL | 0 refills | Status: DC

## 2016-12-21 MED ORDER — OXYCODONE-ACETAMINOPHEN 7.5-325 MG PO TABS: 1.0000 | ORAL_TABLET | Freq: Three times a day (TID) | ORAL | 0 refills | Status: AC | PRN

## 2016-12-21 NOTE — Telephone Encounter (Signed)
Called patient and notified him that his Percocet is available for pickup at [facility]. Sent in Rx for trazodone to pharmacy. -- Harriet Butte, [place], PGY-1

## 2016-12-21 NOTE — Telephone Encounter (Signed)
Discussed with patient refill for Percocet and trazodone. Recently had surgery on shoulder and obtained pain medication refill which will expire 12/29/2016. Will refill for 2 weeks supply to hold patient over until appointment on 01/11/2017 at which point I will refill 3 months prescription. Patient in agreement and will come pick up the clinic. -- Harriet Butte, [place], PGY-1

## 2017-01-02 ENCOUNTER — Other Ambulatory Visit: Payer: Self-pay | Admitting: Family Medicine

## 2017-01-02 DIAGNOSIS — G4709 Other insomnia: Secondary | ICD-10-CM

## 2017-01-06 NOTE — Progress Notes (Signed)
   Subjective:   Patient ID: Todd Hayes    DOB: [birth year], 61 y.o. male   MRN: [id number]  CC: "med refill"  HPI: Todd Hayes is a 61 y.o. male who presents to clinic today for chronic pain management. Problems discussed today are as follows:  Encounter for chronic pain management: Pt recently underwent R shoulder surgery. Tolerating pain well able to perform ADL w/o difficulty. Using arm sling until f/u with ortho later this month. Says he has improved function of R arm but continues to experience "nerves hurting."  ROS: complete ROS performed, see HPI for pertinent ROS.  [place]: COPD, HTN, GERD, lumbar spondylosis w/ myelopathy, tobacco abuse, chronic narcotic use. Smoking status reviewed. Medications reviewed.  Objective:   BP 134/68   Pulse 81   Temp 98.4 F (36.9 C) (Oral)   Ht 6' (1.829 m)   Wt 215 lb (97.5 kg)   SpO2 98%   BMI 29.16 kg/m  Vitals and nursing note reviewed.  General: well nourished, well developed, in no acute distress with non-toxic appearance HEENT: normocephalic, atraumatic, moist mucous membranes Neck: supple, non-tender without lymphadenopathy CV: regular rate and rhythm without murmurs, rubs, or gallops, no lower extremity edema Lungs: clear to auscultation bilaterally with normal work of breathing Skin: warm, dry, no rashes or lesions, cap refill < 2 seconds Extremities: warm and well perfused, normal tone, R anterior shoulder scar from surgery well healed and w/o erythema or edema, 5/5 motor strength b/l, abduction by AROM 120 degrees and 180 by PROM, tender anterior shoulder at [facility] joint  Assessment & Plan:   Encounter for chronic pain management R shoulder pain improved since surgery. Continues to require opioids but adherent to Rx and able to perform ADLs. C/o continued insomnia from pain and nerves. Tolerating Cymbalta and Trazodone well. Opioid dose recently decreased as of 10/2016 from q6h to q8h. --Refilled Percocet 7.5 mg q8h for 3  month supply --Refilled Trazodone 300 mg BID --UDS obtained --Temporary handicap form completed --Discussed need for smoking cessation and healthy lifestyle modifications to improve pain --RTC in 3 months  Orders Placed This Encounter  Procedures  . Pain Mgmt, Profile 5 w/o medMatch U   Meds ordered this encounter  Medications  . fluticasone (FLONASE) 50 MCG/ACT nasal spray    Sig: Place 1 spray into both nostrils daily.    Dispense:  16 g    Refill:  11  . DISCONTD: oxyCODONE-acetaminophen (PERCOCET) 7.5-325 MG tablet    Sig: Take 1 tablet by mouth every 8 (eight) hours as needed for severe pain.    Dispense:  90 tablet    Refill:  0  . traZODone (DESYREL) 150 MG tablet    Sig: Take 2 tablets (300 mg total) by mouth at bedtime.    Dispense:  60 tablet    Refill:  0  . oxyCODONE-acetaminophen (PERCOCET) 7.5-325 MG tablet    Sig: Take 1 tablet by mouth every 8 (eight) hours as needed for severe pain.    Dispense:  90 tablet    Refill:  0    Do not fill before 02/10/2017.  Marland Kitchen oxyCODONE-acetaminophen (PERCOCET) 7.5-325 MG tablet    Sig: Take 1 tablet by mouth every 8 (eight) hours as needed for severe pain.    Dispense:  90 tablet    Refill:  0    Do not fill before 03/12/2017.    Harriet Butte, [place], PGY-1 01/12/2017 12:15 PM

## 2017-01-11 ENCOUNTER — Encounter: Payer: Self-pay | Admitting: Family Medicine

## 2017-01-11 ENCOUNTER — Ambulatory Visit (INDEPENDENT_AMBULATORY_CARE_PROVIDER_SITE_OTHER): Payer: Medicare Other | Admitting: Family Medicine

## 2017-01-11 DIAGNOSIS — M4716 Other spondylosis with myelopathy, lumbar region: Secondary | ICD-10-CM | POA: Diagnosis not present

## 2017-01-11 DIAGNOSIS — G4709 Other insomnia: Secondary | ICD-10-CM | POA: Diagnosis not present

## 2017-01-11 DIAGNOSIS — G8929 Other chronic pain: Secondary | ICD-10-CM | POA: Diagnosis not present

## 2017-01-11 MED ORDER — OXYCODONE-ACETAMINOPHEN 7.5-325 MG PO TABS: 1.0000 | ORAL_TABLET | Freq: Three times a day (TID) | ORAL | 0 refills | Status: DC | PRN

## 2017-01-11 MED ORDER — FLUTICASONE PROPIONATE 50 MCG/ACT NA SUSP: 1.0000 | Freq: Every day | NASAL | 11 refills | Status: DC

## 2017-01-11 MED ORDER — TRAZODONE HCL 150 MG PO TABS: 300.0000 mg | ORAL_TABLET | Freq: Every day | ORAL | 0 refills | Status: DC

## 2017-01-11 MED ORDER — OXYCODONE-ACETAMINOPHEN 7.5-325 MG PO TABS: 1.0000 | ORAL_TABLET | Freq: Three times a day (TID) | ORAL | 0 refills | Status: AC | PRN

## 2017-01-11 NOTE — Assessment & Plan Note (Addendum)
R shoulder pain improved since surgery. Continues to require opioids but adherent to Rx and able to perform ADLs. C/o continued insomnia from pain and nerves. Tolerating Cymbalta and Trazodone well. Opioid dose recently decreased as of 10/2016 from q6h to q8h. --Refilled Percocet 7.5 mg q8h for 3 month supply --Refilled Trazodone 300 mg BID --UDS obtained, pos for Benzodiazapine and Aminoclonazepam, will need to discuss at next visit --Temporary handicap form completed --Discussed need for smoking cessation and healthy lifestyle modifications to improve pain --RTC in 3 months

## 2017-01-11 NOTE — Patient Instructions (Signed)
Thank you for coming in to see Todd Hayes today. Please see below to review our plan for today's visit.  1. I have sent in a refill for your Flonase and trazodone. 2. You have been given paper prescriptions for your percocet. Please use as directed. 3. Return to clinic in 3 months. 4. Call 1-800-QUIT-NOW if you are interested in smoking cessation.  Please call the clinic at [phone] if your symptoms worsen or you have any concerns. It was my pleasure to see you. -- Harriet Butte, [place], PGY-1

## 2017-01-12 ENCOUNTER — Encounter: Payer: Self-pay | Admitting: Family Medicine

## 2017-01-15 LAB — PAIN MGMT, PROFILE 5 W/O MEDMATCH U
Alphahydroxyalprazolam: NEGATIVE ng/mL (ref <25)
Alphahydroxymidazolam: NEGATIVE ng/mL (ref <50)
Alphahydroxytriazolam: NEGATIVE ng/mL (ref <50)
Aminoclonazepam: 192 ng/mL — ABNORMAL HIGH (ref <25)
Amphetamines: NEGATIVE ng/mL (ref <500)
Barbiturates: NEGATIVE ng/mL (ref <300)
Benzodiazepines: POSITIVE ng/mL — AB (ref <100)
Cocaine Metabolite: NEGATIVE ng/mL (ref <150)
Codeine: NEGATIVE ng/mL (ref <50)
Creatinine: 92.7 mg/dL (ref >=20.0)
Hydrocodone: NEGATIVE ng/mL (ref <50)
Hydromorphone: NEGATIVE ng/mL (ref <50)
Hydroxyethylflurazepam: NEGATIVE ng/mL (ref <50)
Lorazepam: NEGATIVE ng/mL (ref <50)
Marijuana Metabolite: NEGATIVE ng/mL (ref <20)
Methadone Metabolite: NEGATIVE ng/mL (ref <100)
Morphine: NEGATIVE ng/mL (ref <50)
Nordiazepam: NEGATIVE ng/mL (ref <50)
Norhydrocodone: NEGATIVE ng/mL (ref <50)
Noroxycodone: 602 ng/mL — ABNORMAL HIGH (ref <50)
Opiates: NEGATIVE ng/mL (ref <100)
Oxazepam: NEGATIVE ng/mL (ref <50)
Oxidant: NEGATIVE ug/mL (ref <200)
Oxycodone: 621 ng/mL — ABNORMAL HIGH (ref <50)
Oxycodone: POSITIVE ng/mL — AB (ref <100)
Oxymorphone: 434 ng/mL — ABNORMAL HIGH (ref <50)
Temazepam: NEGATIVE ng/mL (ref <50)
pH: 5.66 (ref 4.5–9.0)

## 2017-02-20 ENCOUNTER — Other Ambulatory Visit: Payer: Self-pay | Admitting: Family Medicine

## 2017-02-20 ENCOUNTER — Other Ambulatory Visit: Payer: Self-pay | Admitting: Internal Medicine

## 2017-02-20 DIAGNOSIS — G894 Chronic pain syndrome: Secondary | ICD-10-CM

## 2017-02-20 DIAGNOSIS — G4709 Other insomnia: Secondary | ICD-10-CM

## 2017-02-20 DIAGNOSIS — S46811D Strain of other muscles, fascia and tendons at shoulder and upper arm level, right arm, subsequent encounter: Secondary | ICD-10-CM

## 2017-03-21 ENCOUNTER — Other Ambulatory Visit: Payer: Self-pay | Admitting: Family Medicine

## 2017-03-21 DIAGNOSIS — G4709 Other insomnia: Secondary | ICD-10-CM

## 2017-03-21 DIAGNOSIS — G894 Chronic pain syndrome: Secondary | ICD-10-CM

## 2017-03-21 DIAGNOSIS — S46811D Strain of other muscles, fascia and tendons at shoulder and upper arm level, right arm, subsequent encounter: Secondary | ICD-10-CM

## 2017-04-09 NOTE — Progress Notes (Signed)
Subjective:   Patient ID: Todd Hayes    DOB: 12-Jan-1956, 61 y.o. male   MRN: [id number]  CC: "Medication refill"  HPI: Todd Hayes is a 61 y.o. male who presents to clinic today for medication refills. Problems discussed today are as follows:  Chronic pain managment: Patient denies taking medication outside of what I have prescribed. Denies history of using benzodiazepines. States he had his right shoulder surgery prior to UDS on last visit and suspects this might be due to the medication they gave him. Pain is 8/10 when not using medication specifically at feet and shoulders and back. Improves to 5/10 with use oxy. Patient states "feels like he needs it." Though these are when necessary medications, he takes them scheduled. ROS: Denies constipation, dyspnea, fatigue, change in vision, diaphoresis, nausea or vomiting.  Smoking: Patient continues to smoke 1 pack per day. States he previously attempted to stop approximately 20 years ago for 2 weeks but needs it for his "nerves." Patient not interested in smoking cessation. States "he believes he will get lung cancer if he stops." ROS: Denies chest pain, dyspnea, cough, wheeze, claudication.  Complete ROS performed, see HPI for pertinent.  [place]: COPD, HTN, GERD, lumbar spondylosis w/ myelopathy, tobacco abuse, chronic narcotic use. Smoking status reviewed. Medications reviewed.  Objective:   BP 138/62   Pulse 83   Temp 97.5 F (36.4 C) (Oral)   Ht 6' (1.829 m)   Wt 211 lb 9.6 oz (96 kg)   SpO2 97%   BMI 28.70 kg/m  Vitals and nursing note reviewed.  General: appears older than stated age, well nourished, well developed, in no acute distress with non-toxic appearance HEENT: normocephalic, atraumatic, moist mucous membranes CV: regular rate and rhythm without murmurs, rubs, or gallops, no lower extremity edema Lungs: clear to auscultation bilaterally with normal work of breathing Abdomen: soft, non-tender, non-distended,  normoactive bowel sounds Skin: warm, dry, no rashes or lesions, cap refill < 2 seconds Extremities: warm and well perfused, normal tone, right shoulder with improved range of motion with abduction to 90%, internal/external rotation restricted to 70%, mild tenderness to [facility] joint  Assessment & Plan:   Encounter for chronic pain management Chronic. UDS positive for benzo last visit. Pt denies taking. Suspect possibly 2/2 surgery. Patient in agreement to wean oxy if test results positive again. --UDS collected --Oxy refill #90 pills per month with refills x2 to cover next 3 months --Plan to wean next visit if UDS positive for substance not Rx --Trazodone 150 mg BID refilled #60 pills, no refills for insomnia --Baclofen 10 mg daily PRN refilled #30 pills, no refill for muscle spasms  Tobacco use disorder Every day smoker. Currently 1 ppd. Not interested in cessation. Has obvious misconceptions about consequences. Resistant to intervention at this time. --Given 1-800-QUIT-NOW hotline for free resources if interested --Discussed importance for cessation and risk for lung cancer, COPD, and worsening chronic pain  Orders Placed This Encounter  Procedures  . ToxASSURE Select 13 (MW), Urine   Meds ordered this encounter  Medications  . DISCONTD: oxyCODONE-acetaminophen (PERCOCET) 7.5-325 MG tablet    Sig: Take 1 tablet by mouth every 8 (eight) hours as needed for severe pain.    Dispense:  90 tablet    Refill:  0    Do not fill before 03/12/2017.  Marland Kitchen DISCONTD: oxyCODONE-acetaminophen (PERCOCET) 7.5-325 MG tablet    Sig: Take 1 tablet by mouth every 8 (eight) hours as needed for severe pain.  Dispense:  90 tablet    Refill:  0    Do not fill before 03/12/2017.  Marland Kitchen DISCONTD: oxyCODONE-acetaminophen (PERCOCET) 7.5-325 MG tablet    Sig: Take 1 tablet by mouth every 8 (eight) hours as needed for severe pain.    Dispense:  90 tablet    Refill:  0    Do not fill before 04/11/2017.  Marland Kitchen DISCONTD:  oxyCODONE-acetaminophen (PERCOCET) 7.5-325 MG tablet    Sig: Take 1 tablet by mouth every 8 (eight) hours as needed for severe pain.    Dispense:  90 tablet    Refill:  0    Do not fill before 05/11/2017.  Marland Kitchen oxyCODONE-acetaminophen (PERCOCET) 7.5-325 MG tablet    Sig: Take 1 tablet by mouth every 8 (eight) hours as needed for severe pain.    Dispense:  90 tablet    Refill:  0    Do not fill before 06/10/2017.  . baclofen (LIORESAL) 10 MG tablet    Sig: TAKE ONE TABLET BY MOUTH EVERY DAY AS NEEDED FOR MUSCLE SPASMS OR PAIN    Dispense:  30 each    Refill:  0  . traZODone (DESYREL) 150 MG tablet    Sig: Take 2 tablets (300 mg total) by mouth at bedtime.    Dispense:  60 tablet    Refill:  0    Harriet Butte, [place], PGY-1 04/10/2017 7:54 PM

## 2017-04-10 ENCOUNTER — Ambulatory Visit (INDEPENDENT_AMBULATORY_CARE_PROVIDER_SITE_OTHER): Payer: Medicare Other | Admitting: Family Medicine

## 2017-04-10 ENCOUNTER — Encounter: Payer: Self-pay | Admitting: Family Medicine

## 2017-04-10 VITALS — BP 138/62 | HR 83 | Temp 97.5°F | Ht 72.0 in | Wt 211.6 lb

## 2017-04-10 DIAGNOSIS — M4716 Other spondylosis with myelopathy, lumbar region: Secondary | ICD-10-CM

## 2017-04-10 DIAGNOSIS — G4709 Other insomnia: Secondary | ICD-10-CM

## 2017-04-10 DIAGNOSIS — G8929 Other chronic pain: Secondary | ICD-10-CM

## 2017-04-10 DIAGNOSIS — G894 Chronic pain syndrome: Secondary | ICD-10-CM

## 2017-04-10 DIAGNOSIS — S46811D Strain of other muscles, fascia and tendons at shoulder and upper arm level, right arm, subsequent encounter: Secondary | ICD-10-CM | POA: Diagnosis not present

## 2017-04-10 DIAGNOSIS — F17200 Nicotine dependence, unspecified, uncomplicated: Secondary | ICD-10-CM

## 2017-04-10 MED ORDER — OXYCODONE-ACETAMINOPHEN 7.5-325 MG PO TABS: 1.0000 | ORAL_TABLET | Freq: Three times a day (TID) | ORAL | 0 refills | Status: DC | PRN

## 2017-04-10 MED ORDER — TRAZODONE HCL 150 MG PO TABS: 300.0000 mg | ORAL_TABLET | Freq: Every day | ORAL | 0 refills | Status: DC

## 2017-04-10 MED ORDER — BACLOFEN 10 MG PO TABS: ORAL_TABLET | ORAL | 0 refills | Status: DC

## 2017-04-10 NOTE — Patient Instructions (Signed)
Thank you for coming in to see [place] today. Please see below to review our plan for today's visit.  1. We will retest your urine drug screen given the positive result during last visit. If this results as a positive for a medication I did not prescribe, we will need to start weaning your oxycodone use. I will notify you of the results during her next visit in 3 months. 2. Smoking can be a way to relax, however this can also be detrimental to your health. I understand you're not ready to stop smoking but please let me know if you are at all interested. In the meantime you can call 1-800-quit-now for free resources. 3. Your right shoulder will likely always have pain should improve since your surgery. If this continues to be bothersome can schedule a focused follow-up for this issue. I cannot promise should the pain will go away. 4. Return to the clinic in 3 months for refills of her medications.  Please call the clinic at [phone] if your symptoms worsen or you have any concerns. It was my pleasure to see you. -- Harriet Butte, [place], PGY-1

## 2017-04-10 NOTE — Assessment & Plan Note (Addendum)
Every day smoker. Currently 1 ppd. Not interested in cessation. Has obvious misconceptions about consequences. Resistant to intervention at this time. --Given 1-800-QUIT-NOW hotline for free resources if interested --Discussed importance for cessation and risk for lung cancer, COPD, and worsening chronic pain

## 2017-04-10 NOTE — Assessment & Plan Note (Addendum)
Chronic. UDS positive for benzo last visit. Pt denies taking. Suspect possibly 2/2 surgery. Patient in agreement to wean oxy if test results positive again. --UDS collected --Oxy refill #90 pills per month with refills x2 to cover next 3 months --Plan to wean next visit if UDS positive for substance not Rx --Trazodone 150 mg BID refilled #60 pills, no refills for insomnia --Baclofen 10 mg daily PRN refilled #30 pills, no refill for muscle spasms --RTC in 3 months

## 2017-04-11 DIAGNOSIS — G8929 Other chronic pain: Secondary | ICD-10-CM | POA: Diagnosis not present

## 2017-04-16 LAB — TOXASSURE SELECT 13 (MW), URINE

## 2017-05-14 ENCOUNTER — Other Ambulatory Visit: Payer: Self-pay | Admitting: Family Medicine

## 2017-05-14 DIAGNOSIS — G894 Chronic pain syndrome: Secondary | ICD-10-CM

## 2017-05-14 DIAGNOSIS — G4709 Other insomnia: Secondary | ICD-10-CM

## 2017-06-07 ENCOUNTER — Other Ambulatory Visit: Payer: Self-pay | Admitting: Family Medicine

## 2017-06-07 DIAGNOSIS — G4709 Other insomnia: Secondary | ICD-10-CM

## 2017-06-10 ENCOUNTER — Other Ambulatory Visit: Payer: Self-pay | Admitting: *Deleted

## 2017-06-10 ENCOUNTER — Other Ambulatory Visit: Payer: Self-pay | Admitting: Family Medicine

## 2017-06-10 DIAGNOSIS — G894 Chronic pain syndrome: Secondary | ICD-10-CM

## 2017-06-10 NOTE — Telephone Encounter (Signed)
Also received refill request for mometasone furoate 50 mcg nasal spray (17 gm) - instill one spray into each nostril daily.  Rx written on 02/22/2017 and last refilled on 04/05/17.  Rx not listed on med list.   Burna Forts, BSN, RN-BC

## 2017-06-10 NOTE — Telephone Encounter (Signed)
pt calling to request refill of:  Name of Medication(s): flonase Last date of OV: Pharmacy: [facility]  Will route refill request to Clinic RN.  Discussed with patient policy to call pharmacy for future refills.  Also, discussed refills may take up to 48 hours to approve or deny.  Roseanna Rainbow   Pt says he has no refills although Rx shows there were 11 refills given in march 2018

## 2017-06-11 MED ORDER — BACLOFEN 10 MG PO TABS: ORAL_TABLET | ORAL | 0 refills | Status: DC

## 2017-06-11 MED ORDER — FLUTICASONE PROPIONATE 50 MCG/ACT NA SUSP: 1.0000 | Freq: Every day | NASAL | 11 refills | Status: DC

## 2017-07-09 ENCOUNTER — Ambulatory Visit (INDEPENDENT_AMBULATORY_CARE_PROVIDER_SITE_OTHER): Payer: Medicare Other | Admitting: Family Medicine

## 2017-07-09 ENCOUNTER — Encounter: Payer: Self-pay | Admitting: Family Medicine

## 2017-07-09 VITALS — BP 120/70 | HR 84 | Temp 98.0°F | Ht 72.0 in | Wt 207.0 lb

## 2017-07-09 DIAGNOSIS — M4716 Other spondylosis with myelopathy, lumbar region: Secondary | ICD-10-CM

## 2017-07-09 DIAGNOSIS — G894 Chronic pain syndrome: Secondary | ICD-10-CM | POA: Diagnosis not present

## 2017-07-09 DIAGNOSIS — M7541 Impingement syndrome of right shoulder: Secondary | ICD-10-CM

## 2017-07-09 DIAGNOSIS — N529 Male erectile dysfunction, unspecified: Secondary | ICD-10-CM | POA: Insufficient documentation

## 2017-07-09 DIAGNOSIS — G8929 Other chronic pain: Secondary | ICD-10-CM

## 2017-07-09 DIAGNOSIS — N522 Drug-induced erectile dysfunction: Secondary | ICD-10-CM | POA: Diagnosis not present

## 2017-07-09 MED ORDER — OXYCODONE-ACETAMINOPHEN 7.5-325 MG PO TABS: ORAL_TABLET | ORAL | 0 refills | Status: DC

## 2017-07-09 MED ORDER — SILDENAFIL CITRATE 20 MG PO TABS: ORAL_TABLET | ORAL | 3 refills | Status: DC

## 2017-07-09 MED ORDER — DULOXETINE HCL 30 MG PO CPEP: 30.0000 mg | ORAL_CAPSULE | Freq: Every day | ORAL | 11 refills | Status: DC

## 2017-07-09 MED ORDER — BACLOFEN 10 MG PO TABS: ORAL_TABLET | ORAL | 0 refills | Status: DC

## 2017-07-09 NOTE — Patient Instructions (Signed)
Thank you for coming in to see Todd Hayes today. Please see below to review our plan for today's visit.  1. Please continue to make sure you performing your exercises on your right shoulder to prevent adhesion. The steroid injection should help with the pain temporarily to get you through the exercises. He can continue taking the medications I am prescribing you to help with the pain but understand we are weaning you off your Percocet. I would like to see you in 3 months from now.  2. Attached is information on colonoscopy. Please schedule this under an time.  3. At any point if you're interested in smoking cessation, please let me know. In the meantime he can call 1-800-quit-now  Please call the clinic at [phone] if your symptoms worsen or you have any concerns. It was my pleasure to see you. -- Harriet Butte, [place], PGY-2

## 2017-07-09 NOTE — Progress Notes (Signed)
Subjective:   Patient ID: Todd Hayes    DOB: 06/16/1956, 61 y.o. male   MRN: [id number]  CC: "Chronic pain management"  HPI: Todd Hayes is a 60 y.o. male who presents to clinic today for chronic pain managemnt. Problems discussed today are as follows:  Chronic pain management: The patient understands the consequences of his positive UDS for THC but states he did not directly take the marijuana. Patient's pain continues to aggravate him in multiple joints and areas of his body including his legs, shoulders, hands and feet. Patient requesting steroid injection for his shoulder.  Erectile dysfunction: Patient has been experiencing issues of inability to maintain an erection. He states he is able to have nocturnal tumescence.  ROS: Denies chest pain, shortness of breath, headache.  Right shoulder pain: Patient continues to perform daily exercises given after right rotator cuff surgery. He is able to use his arm more with increased range of motion but continues to have pain with occasional clicking sensation. He is requesting a right shoulder injection  Complete ROS performed, see HPI for pertinent.  [place]: COPD, HTN, GERD, lumbar spondylosis w/ myelopathy, tobacco abuse, chronic narcotic use. Smoking status reviewed. Medications reviewed.  Objective:   BP 120/70   Pulse 84   Temp 98 F (36.7 C) (Oral)   Ht 6' (1.829 m)   Wt 207 lb (93.9 kg)   SpO2 97%   BMI 28.07 kg/m  Vitals and nursing note reviewed.  General: well nourished, well developed, in no acute distress with non-toxic appearance CV: regular rate and rhythm without murmurs, rubs, or gallops, no lower extremity edema Lungs: clear to auscultation bilaterally with normal work of breathing Abdomen: soft, non-tender, non-distended, no masses or organomegaly palpable, normoactive bowel sounds Skin: warm, dry, no rashes or lesions, cap refill < 2 seconds Extremities: warm and well perfused, normal tone, active range of  motion with abduction of right upper extremity approximately 80%, tender on multiple areas of right shoulder particularly at acromion  Procedure Note: Right-Shoulder Injection Patient was given informed consent, signed copy in the chart. Appropriate time out was taken. Area prepped and draped in usual sterile fashion. 10 cc of methylprednisolone 40 mg/ml plus  4 cc of 1% lidocaine without epinephrine was injected into the right shoulder using a(n) posterior approach. The patient tolerated the procedure well. There were no complications. Post procedure instructions were given.  Assessment & Plan:   Erectile dysfunction Acute. Maintaining nocturnal tumescence. May be secondary to Cymbalta use. Patient resistant to discontinuing Cymbalta due to his neuropathy and pain. --Will trial with sildenafil 20 mg with instructions to take 5 tablets as needed one hour prior to intercourse  Encounter for chronic pain management Chronic. Tested positive on 2 separate UDS for 2 separate substances including benzos in marijuana. Discussed at length with patient reasons for weaning off opiates due to his break from the contract he originally signed. Patient appears agreeable to plan. 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 Improved since surgery. Agreeable to steroid injection today though I recommended patient avoiding this in the future given his risk for tendon rupture. --Steroid injection performed  on right shoulder from posterior aspect, the procedure note for details  No orders of the defined types were placed in this encounter.  Meds ordered this encounter    Medications  . sildenafil (REVATIO) 20 MG tablet    Sig: Take 5 tablets 1 hour before needed. Do not use more than once daily.    Dispense:  120 tablet    Refill:  3  . baclofen (LIORESAL) 10 MG tablet    Sig: TAKE ONE TABLET BY MOUTH EVERY DAY AS NEEDED FOR MUSCLE SPASMS OR PAIN    Dispense:  30 each    Refill:  0    Do not fill before 07/16/2017.  . DULoxetine (CYMBALTA) 30 MG capsule    Sig: Take 1 capsule (30 mg total) by mouth daily.    Dispense:  30 capsule    Refill:  11  . DISCONTD: oxyCODONE-acetaminophen (PERCOCET) 7.5-325 MG tablet    Sig: Take 1 tablet every 8 hours as needed for pain every other day. On all other days take 1 tab every 12 hours as needed for pain.    Dispense:  75 tablet    Refill:  0    Do not fill before 07/11/2017.  Marland Kitchen DISCONTD: oxyCODONE-acetaminophen (PERCOCET) 7.5-325 MG tablet    Sig: Take 1 tablet every 12 hours as needed for pain.    Dispense:  60 tablet    Refill:  0    Do not fill before 08/11/2017.  Marland Kitchen oxyCODONE-acetaminophen (PERCOCET) 7.5-325 MG tablet    Sig: Take 1 tablet every 12 hours as needed for pain.    Dispense:  60 tablet    Refill:  0    Do not fill before 09/10/2017.    Harriet Butte, [place], PGY-2 07/09/2017 5:53 PM

## 2017-07-09 NOTE — Assessment & Plan Note (Addendum)
Acute. Maintaining nocturnal tumescence. May be secondary to Cymbalta use. Patient resistant to discontinuing Cymbalta due to his neuropathy and pain. --Will trial with sildenafil 20 mg with instructions to take 5 tablets as needed one hour prior to intercourse

## 2017-07-09 NOTE — Assessment & Plan Note (Addendum)
Chronic. Improved since surgery. Agreeable to steroid injection today though I recommended patient avoiding this in the future given his risk for tendon rupture. --Steroid injection performed on right shoulder from posterior aspect, the procedure note for details

## 2017-07-09 NOTE — Assessment & Plan Note (Addendum)
Chronic. Tested positive on 2 separate UDS for 2 separate substances including benzos in marijuana. Discussed at length with patient reasons for weaning off opiates due to his break from the contract he originally signed. Patient appears agreeable to plan. --Provided 3 months supply of Percocet 7.5/325 with instructions to take every 8 hours as needed for pain with wean to every 8 hours every other day with every 12 hours during the second month supply  --Will need to continue weaning at his next --Refill provided for Cymbalta 30 mg daily and baclofen 10 mg daily as needed

## 2017-07-16 ENCOUNTER — Other Ambulatory Visit: Payer: Self-pay | Admitting: Family Medicine

## 2017-07-16 DIAGNOSIS — G894 Chronic pain syndrome: Secondary | ICD-10-CM

## 2017-07-16 DIAGNOSIS — M4716 Other spondylosis with myelopathy, lumbar region: Secondary | ICD-10-CM

## 2017-07-16 NOTE — Telephone Encounter (Signed)
Pt informed. Mansa Willers T Skylarr Liz, CMA  

## 2017-07-16 NOTE — Telephone Encounter (Signed)
Rx already given during last visit. Pharmacy instructed to not fill till 9/4. Should be available for pick up. Thanks.

## 2017-07-17 ENCOUNTER — Other Ambulatory Visit: Payer: Self-pay | Admitting: *Deleted

## 2017-07-17 DIAGNOSIS — G4709 Other insomnia: Secondary | ICD-10-CM

## 2017-07-17 MED ORDER — TRAZODONE HCL 150 MG PO TABS: 300.0000 mg | ORAL_TABLET | Freq: Every day | ORAL | 0 refills | Status: DC

## 2017-07-19 ENCOUNTER — Telehealth: Payer: Self-pay | Admitting: *Deleted

## 2017-07-19 NOTE — Telephone Encounter (Signed)
Patient called regarding sildenafil 20 mg "not helping with anything".  Wants to know if PCP can change to new med.  Will route request to PCP and call patient back.  Burna Forts, BSN, RN-BC

## 2017-07-23 ENCOUNTER — Other Ambulatory Visit: Payer: Self-pay | Admitting: Family Medicine

## 2017-07-23 ENCOUNTER — Telehealth: Payer: Self-pay | Admitting: Family Medicine

## 2017-07-23 DIAGNOSIS — N522 Drug-induced erectile dysfunction: Secondary | ICD-10-CM

## 2017-07-23 MED ORDER — TADALAFIL 10 MG PO TABS: 10.0000 mg | ORAL_TABLET | Freq: Every day | ORAL | 0 refills | Status: DC | PRN

## 2017-07-23 NOTE — Telephone Encounter (Signed)
Contacted pt regarding Sidinafil not working for ED. Pt still gets erection usually in the morning but med is not working in evening. Wants to try Cialis. Told pt will have to pay out of pocket and will cost more. If does not work will need to consider discontinuing Cymbalta. -- Harriet Butte, [place], PGY-2

## 2017-07-24 ENCOUNTER — Telehealth: Payer: Self-pay | Admitting: *Deleted

## 2017-07-24 NOTE — Telephone Encounter (Signed)
PA faxed to [facility] for review.  Derl Barrow, RN

## 2017-07-24 NOTE — Telephone Encounter (Signed)
Prior Authorization received from [place] for Cialis 10 mg.  PA form placed in provider box for completion. Derl Barrow, RN

## 2017-07-25 NOTE — Telephone Encounter (Signed)
PA for Cialis was denied via [facility]. Medication that is used for treatment of sexual dysfunction is excluded from medicare coverage. Patient will have to pay out of pocket. Reference numeber: [id number].  Derl Barrow, RN

## 2017-08-06 ENCOUNTER — Other Ambulatory Visit: Payer: Self-pay | Admitting: Internal Medicine

## 2017-08-06 DIAGNOSIS — K219 Gastro-esophageal reflux disease without esophagitis: Secondary | ICD-10-CM

## 2017-08-13 ENCOUNTER — Other Ambulatory Visit: Payer: Self-pay | Admitting: Family Medicine

## 2017-08-13 DIAGNOSIS — M4716 Other spondylosis with myelopathy, lumbar region: Secondary | ICD-10-CM

## 2017-08-13 DIAGNOSIS — G4709 Other insomnia: Secondary | ICD-10-CM

## 2017-08-13 DIAGNOSIS — G894 Chronic pain syndrome: Secondary | ICD-10-CM

## 2017-08-26 ENCOUNTER — Other Ambulatory Visit: Payer: Self-pay | Admitting: Internal Medicine

## 2017-08-26 DIAGNOSIS — G2581 Restless legs syndrome: Secondary | ICD-10-CM

## 2017-09-06 ENCOUNTER — Other Ambulatory Visit: Payer: Self-pay | Admitting: Internal Medicine

## 2017-09-06 DIAGNOSIS — M4716 Other spondylosis with myelopathy, lumbar region: Secondary | ICD-10-CM

## 2017-09-13 ENCOUNTER — Other Ambulatory Visit: Payer: Self-pay | Admitting: Family Medicine

## 2017-09-13 DIAGNOSIS — G4709 Other insomnia: Secondary | ICD-10-CM

## 2017-09-17 ENCOUNTER — Other Ambulatory Visit: Payer: Self-pay | Admitting: *Deleted

## 2017-09-17 DIAGNOSIS — M4716 Other spondylosis with myelopathy, lumbar region: Secondary | ICD-10-CM

## 2017-09-17 DIAGNOSIS — G894 Chronic pain syndrome: Secondary | ICD-10-CM

## 2017-09-17 MED ORDER — BACLOFEN 10 MG PO TABS: ORAL_TABLET | ORAL | 0 refills | Status: DC

## 2017-09-17 NOTE — Telephone Encounter (Signed)
Patient left message on nurse line requesting refill on baclofen, states he has been out for three days.

## 2017-10-01 ENCOUNTER — Other Ambulatory Visit: Payer: Self-pay | Admitting: Family Medicine

## 2017-10-01 DIAGNOSIS — M4716 Other spondylosis with myelopathy, lumbar region: Secondary | ICD-10-CM

## 2017-10-01 DIAGNOSIS — G2581 Restless legs syndrome: Secondary | ICD-10-CM

## 2017-10-01 DIAGNOSIS — G894 Chronic pain syndrome: Secondary | ICD-10-CM

## 2017-10-01 DIAGNOSIS — G4709 Other insomnia: Secondary | ICD-10-CM

## 2017-10-07 NOTE — Progress Notes (Signed)
Subjective   Patient ID: Todd Hayes    DOB: [birth year], 61 y.o. male   MRN: [id number]  CC: "Chronic pain"  HPI: Todd Hayes is a 61 y.o. male who presents to clinic today for the following:  Chronic pain: Ongoing issue particularly in the lower back and shoulders.  Unchanged since last visit with since failing x2 prior UDS.  Patient requesting "nerve medication."  Insomnia: Patient states this has been a chronic issue for him for many years.  He usually goes to sleep around 11:30 in the evening and wakes up approximately 5-6 AM.  He states sleep onset is not difficult for him but he wakes up earlier than he desires.  Denies shortness of breath, orthopnea, PND.  Patient does have chronic fatigue which is unchanged for the past year.  Erectile dysfunction: Was given sildenafil during his last visit without resolution.  Patient would like to stop Cymbalta to see if this improves his symptoms.  ROS: see HPI for pertinent.  [place]: COPD, HTN, GERD, lumbar spondylosis w/ myelopathy, tobacco abuse, chronic narcotic use now weaning due to failed UDS.  Surgical history right-shoulder.  Family history unremarkable.  Smoking status reviewed.  Medications reviewed.  Objective   BP 120/66   Pulse 78   Temp 98.6 F (37 C) (Oral)   Ht 6' (1.829 m)   Wt 212 lb 3.2 oz (96.3 kg)   SpO2 95%   BMI 28.78 kg/m  Vitals and nursing note reviewed.  General: well-appearing male, NAD Cardiovascular: regular rate and rhythm without murmurs, rubs, or gallops Lungs: clear to auscultation bilaterally with normal work of breathing Abdomen: soft, non-tender, non-distended, normoactive bowel sounds Skin: warm, dry, no rashes or lesions, cap refill < 2 seconds Extremities: warm and well perfused, normal tone, no edema MSK: 5/5 motor strength in all 4 extremities, mild tenderness on paraspinal musculature lumbar region bilaterally, tenderness especially at right AC joint on right shoulder  Assessment &  Plan   Erectile dysfunction Chronic.  Failed sildenafil.  Suspect Cymbalta may be etiology.  Unknown testosterone level. - Patient is to taper Cymbalta and given new prescription for 20 mg daily for the next 2 weeks - Will obtain testosterone, free, total, SHBG  Encounter for chronic pain management Chronic.  Currently weaning oxycodone due to failed UDS x2.  Patient's functionality seems to be unchanged since last visit despite weaning. - Given 17-month taper with oxycodone with plan for continual taper until discontinued - Ambulatory referral to pain clinic who will manage chronic pain meds after completion of taper  Insomnia Chronic.  Currently taking trazodone.  Difficult to discern reason for it is clear that patient has poor sleep hygiene and is on multiple medications which can manipulate his sleep pattern.  Has RLS well-controlled. - Given instructions for proper sleep hygiene - Continue trazodone 300 mg daily and ropinirole 0.5 mg nightly - Advised patient to undergo nocturnal polysomnogram but declined  Orders Placed This Encounter  Procedures  . Testosterone, Free, Total, SHBG  . Hepatitis C antibody  . Ambulatory referral to Pain Clinic    Referral Priority:   Routine    Referral Type:   Consultation    Referral Reason:   Specialty Services Required    Requested Specialty:   Pain Medicine    Number of Visits Requested:   1   Meds ordered this encounter  Medications  . oxyCODONE-acetaminophen (ROXICET) 5-325 MG tablet    Sig: Take 1 tablet by mouth every 12 (  twelve) hours as needed for severe pain.    Dispense:  60 tablet    Refill:  0  . oxyCODONE-acetaminophen (ROXICET) 5-325 MG tablet    Sig: Take 1-2 tablets by mouth daily as needed for severe pain.    Dispense:  40 tablet    Refill:  0    DO NOT FILL BEFORE 11/11/17  . oxyCODONE-acetaminophen (ROXICET) 5-325 MG tablet    Sig: Take 1 tablet by mouth daily as needed for severe pain.    Dispense:  30 tablet     Refill:  0    DO NOT FILL BEFORE 12/12/16  . DULoxetine (CYMBALTA) 20 MG capsule    Sig: Take 1 capsule (20 mg total) by mouth daily.    Dispense:  14 capsule    Refill:  0    Harriet Butte, [place], PGY-2 10/11/2017, 5:18 PM

## 2017-10-11 ENCOUNTER — Encounter: Payer: Self-pay | Admitting: Family Medicine

## 2017-10-11 ENCOUNTER — Other Ambulatory Visit: Payer: Self-pay

## 2017-10-11 ENCOUNTER — Ambulatory Visit (INDEPENDENT_AMBULATORY_CARE_PROVIDER_SITE_OTHER): Payer: Medicare Other | Admitting: Family Medicine

## 2017-10-11 VITALS — BP 120/66 | HR 78 | Temp 98.6°F | Ht 72.0 in | Wt 212.2 lb

## 2017-10-11 DIAGNOSIS — G4700 Insomnia, unspecified: Secondary | ICD-10-CM | POA: Diagnosis not present

## 2017-10-11 DIAGNOSIS — N522 Drug-induced erectile dysfunction: Secondary | ICD-10-CM

## 2017-10-11 DIAGNOSIS — Z1159 Encounter for screening for other viral diseases: Secondary | ICD-10-CM | POA: Diagnosis not present

## 2017-10-11 DIAGNOSIS — G8929 Other chronic pain: Secondary | ICD-10-CM

## 2017-10-11 DIAGNOSIS — G894 Chronic pain syndrome: Secondary | ICD-10-CM

## 2017-10-11 MED ORDER — OXYCODONE-ACETAMINOPHEN 5-325 MG PO TABS: 1.0000 | ORAL_TABLET | Freq: Every day | ORAL | 0 refills | Status: DC | PRN

## 2017-10-11 MED ORDER — DULOXETINE HCL 20 MG PO CPEP: 20.0000 mg | ORAL_CAPSULE | Freq: Every day | ORAL | 0 refills | Status: DC

## 2017-10-11 MED ORDER — OXYCODONE-ACETAMINOPHEN 5-325 MG PO TABS: 1.0000 | ORAL_TABLET | Freq: Two times a day (BID) | ORAL | 0 refills | Status: DC | PRN

## 2017-10-11 NOTE — Assessment & Plan Note (Addendum)
Chronic.  Currently taking trazodone.  Difficult to discern reason for it is clear that patient has poor sleep hygiene and is on multiple medications which can manipulate his sleep pattern.  Has RLS well-controlled. - Given instructions for proper sleep hygiene - Continue trazodone 300 mg daily and ropinirole 0.5 mg nightly - Advised patient to undergo nocturnal polysomnogram but declined

## 2017-10-11 NOTE — Patient Instructions (Addendum)
Thank you for coming in to see [place] today. Please see below to review our plan for today's visit.  1.  Your insomnia could be due to several things.  One would be sleep apnea which I would recommend a sleep study to evaluate you.  Please consider this option.  You can continue the trazodone and baclofen for the time being.  Insomnia is a common problem (affects 10% of the population). Sleep hygiene is key to helping restore your sleep. Here are some basic sleep hygiene tips:  No electronic devices or TV in bed  Keep bedroom dark with comfortable cool temperature  No caffeine after 2-4pm  No alcohol within 6 hours of bedtime  No nicotine prior to bedtime  Avoid heavy meal and lots of liquid right before bed  Avoid naps or keep them brief (<30 mins)  Avoid lots of physical activity before sleep  Remember bed is for the 2 S's (sleep and sex)  Make a routine (bath, reading a book, etc.) prior to bed  Set a consistent wake time for all 7 days of the week  You can supplement with mindfulness meditation like deep breathing, stretching, low-intensity yoga, relaxing music 2.  I have given you your next 73-month supply of pain medication.  I have also placed a referral you to see a pain clinic for further management.  I would like to continue seeing you for your other medical problems at this time. 3.  I will call you if your blood work is abnormal.  Otherwise expect to receive the results in the mail. 4.  I would like you to stop your Cymbalta.  Do this by cutting her dose in half over the next 2 weeks and then reducing to 1/2 pill every other day for an additional 2 weeks at which point you can discontinue.  Please call the clinic at [phone] if your symptoms worsen or you have any concerns. It was our pleasure to serve you.  Harriet Butte, [employer], PGY-2

## 2017-10-11 NOTE — Assessment & Plan Note (Addendum)
Chronic.  Failed sildenafil.  Suspect Cymbalta may be etiology.  Unknown testosterone level. - Patient is to taper Cymbalta and given new prescription for 20 mg daily for the next 2 weeks - Will obtain testosterone, free, total, SHBG

## 2017-10-11 NOTE — Assessment & Plan Note (Addendum)
Chronic.  Currently weaning oxycodone due to failed UDS x2.  Patient's functionality seems to be unchanged since last visit despite weaning. - Given 40-month taper with oxycodone with plan for continual taper until discontinued - Ambulatory referral to pain clinic who will manage chronic pain meds after completion of taper

## 2017-10-12 LAB — TESTOSTERONE, FREE, TOTAL, SHBG
SEX HORMONE BINDING: 49.3 nmol/L (ref 19.3–76.4)
Testosterone, Free: 6.7 pg/mL (ref 6.6–18.1)
Testosterone: 336 ng/dL (ref 264–916)

## 2017-10-12 LAB — HEPATITIS C ANTIBODY

## 2017-10-14 ENCOUNTER — Encounter: Payer: Self-pay | Admitting: Family Medicine

## 2017-11-06 ENCOUNTER — Other Ambulatory Visit: Payer: Self-pay | Admitting: Family Medicine

## 2017-11-06 DIAGNOSIS — M4716 Other spondylosis with myelopathy, lumbar region: Secondary | ICD-10-CM

## 2017-11-06 DIAGNOSIS — G4709 Other insomnia: Secondary | ICD-10-CM

## 2017-11-06 DIAGNOSIS — G2581 Restless legs syndrome: Secondary | ICD-10-CM

## 2017-11-28 ENCOUNTER — Other Ambulatory Visit: Payer: Self-pay | Admitting: Family Medicine

## 2017-11-28 DIAGNOSIS — I10 Essential (primary) hypertension: Secondary | ICD-10-CM

## 2017-12-04 ENCOUNTER — Other Ambulatory Visit: Payer: Self-pay | Admitting: *Deleted

## 2017-12-04 ENCOUNTER — Other Ambulatory Visit: Payer: Self-pay | Admitting: Family Medicine

## 2017-12-04 DIAGNOSIS — M4716 Other spondylosis with myelopathy, lumbar region: Secondary | ICD-10-CM

## 2017-12-04 DIAGNOSIS — G894 Chronic pain syndrome: Secondary | ICD-10-CM

## 2017-12-04 DIAGNOSIS — G4709 Other insomnia: Secondary | ICD-10-CM

## 2017-12-04 DIAGNOSIS — G2581 Restless legs syndrome: Secondary | ICD-10-CM

## 2017-12-04 MED ORDER — BACLOFEN 10 MG PO TABS: ORAL_TABLET | ORAL | 0 refills | Status: DC

## 2017-12-13 ENCOUNTER — Other Ambulatory Visit: Payer: Self-pay | Admitting: Family Medicine

## 2017-12-13 DIAGNOSIS — G4709 Other insomnia: Secondary | ICD-10-CM

## 2017-12-31 NOTE — Progress Notes (Signed)
Subjective   Patient ID: Todd Hayes    DOB: [birth year], 62 y.o. male   MRN: [id number]  CC: "Medication refill"  HPI: Todd Hayes is a 62 y.o. male who presents to clinic today for the following:  Chronic pain: Currently tapering oxycodone following failed urine drug screen x2.  Patient insisting on additional muscle relaxers.  His functional status remains unchanged being that he was able to clean out debris in the neighbors pool yesterday. Patient also insisting on seeing another provider who will give him the pain medications he would like.  After explaining the standard of care at the clinic and that all the providers provide him the same care, patient insisted on referral to pain specialty clinic.  COPD: Patient states he has had some coughing lately and blames the oxycodone wean for this.  He continues to smoke 1 pack/day.  He has been "smoking all of his life."  He is not interested in cessation.  Patient also not interested in pulmonary function testing.  He does not have any inhalers at home.  Smoking: Continues to smoke 1 pack/day.  Not interested in cessation today.  Denies fevers or chills, chest pain, shortness of breath, change in weight.  ROS: see HPI for pertinent.  [place]: COPD, HTN, GERD, lumbar spondylosis w/ myelopathy, tobacco abuse, chronic narcotic use now weaning due to failed UDS.  Surgical history right-shoulder.  Family history unremarkable.  Smoking status reviewed.  Medications reviewed.  Objective   BP 119/65 (BP Location: Left Arm, Patient Position: Sitting, Cuff Size: Normal)   Pulse 78   Temp 98.1 F (36.7 C) (Oral)   Ht 6' (1.829 m)   Wt 205 lb 3.2 oz (93.1 kg)   SpO2 97%   BMI 27.83 kg/m  Vitals and nursing note reviewed.  General: well nourished, well developed, NAD with non-toxic appearance HEENT: normocephalic, atraumatic, moist mucous membranes Neck: supple, non-tender without lymphadenopathy Cardiovascular: regular rate and rhythm  without murmurs, rubs, or gallops Lungs: apical wheezing bilaterally with normal work of breathing Abdomen: soft, non-tender, non-distended, normoactive bowel sounds Skin: warm, dry, no rashes or lesions, cap refill < 2 seconds Extremities: warm and well perfused, normal tone, no edema  Assessment & Plan   COPD (chronic obstructive pulmonary disease) (HCC) Chronic.  No signs of acute exacerbation.  Not currently on controller medication.  Does not have emergency inhaler.  Mild expiratory wheeze on apical region bilaterally.  Patient endorsing nonproductive cough intermittently without cough during evaluation.  Current everyday smoker.  Patient clearly resistant to smoking cessation and additional therapies. - Offered albuterol treatment, declined - Given prescription for albuterol inhaler - Declined PFT  Lumbar spondylosis with myelopathy Chronic.  Currently undergoing opioid wean following failed urine drug screen x2.  Patient insistent on receiving alternative medications such as additional muscle relaxer.  This does not seem to be limiting patient given recent yard work pulling debris out of the pool as of yesterday. - Continue oxycodone wean over the next 3 months - Refill baclofen 10 mg daily as needed, Mobic 7.5 mg daily - Referral to pain clinic - RTC 3 months  Tobacco use disorder Chronic.  Current everyday smoker.  Cigarettes 1 pack/day. - Patient declined PFT, low contrast CT screen - Advised patient to stop smoking  Orders Placed This Encounter  Procedures  . Ambulatory referral to Pain Clinic    Referral Priority:   Routine    Referral Type:   Consultation    Referral Reason:  Specialty Services Required    Requested Specialty:   Pain Medicine    Number of Visits Requested:   1   Meds ordered this encounter  Medications  . oxyCODONE-acetaminophen (ROXICET) 5-325 MG tablet    Sig: Take 1 tablet by mouth daily as needed for severe pain.    Dispense:  15 tablet     Refill:  0    DO NOT FILL BEFORE 02/15/18.  Marland Kitchen oxyCODONE-acetaminophen (ROXICET) 5-325 MG tablet    Sig: Take 1 tablet by mouth daily as needed for severe pain.    Dispense:  20 tablet    Refill:  0    DO NOT FILL BEFORE 01/15/18.  Marland Kitchen oxyCODONE-acetaminophen (ROXICET) 5-325 MG tablet    Sig: Take 1 tablet by mouth daily as needed for severe pain.    Dispense:  25 tablet    Refill:  0    DO NOT FILL BEFORE 12/15/17.  Marland Kitchen traZODone (DESYREL) 150 MG tablet    Sig: Take 2 tablets (300 mg total) by mouth at bedtime.    Dispense:  60 tablet    Refill:  0  . rOPINIRole (REQUIP) 0.25 MG tablet    Sig: Take 2 tablets (0.5 mg total) by mouth at bedtime.    Dispense:  60 tablet    Refill:  0  . meloxicam (MOBIC) 7.5 MG tablet    Sig: Take 1 tablet (7.5 mg total) by mouth daily.    Dispense:  30 tablet    Refill:  0  . baclofen (LIORESAL) 10 MG tablet    Sig: TAKE ONE TABLET BY MOUTH EVERY DAY AS NEEDED FOR MUSCLE SPASMS OR PAIN    Dispense:  30 each    Refill:  0  . albuterol (PROVENTIL HFA;VENTOLIN HFA) 108 (90 Base) MCG/ACT inhaler    Sig: Inhale 2 puffs into the lungs every 6 (six) hours as needed for wheezing or shortness of breath.    Dispense:  1 Inhaler    Refill:  2    Harriet Butte, [place], PGY-2 01/01/2018, 3:51 PM

## 2018-01-01 ENCOUNTER — Telehealth: Payer: Self-pay

## 2018-01-01 ENCOUNTER — Encounter: Payer: Self-pay | Admitting: Family Medicine

## 2018-01-01 ENCOUNTER — Ambulatory Visit (INDEPENDENT_AMBULATORY_CARE_PROVIDER_SITE_OTHER): Payer: Medicare Other | Admitting: Family Medicine

## 2018-01-01 VITALS — BP 119/65 | HR 78 | Temp 98.1°F | Ht 72.0 in | Wt 205.2 lb

## 2018-01-01 DIAGNOSIS — G2581 Restless legs syndrome: Secondary | ICD-10-CM

## 2018-01-01 DIAGNOSIS — G8929 Other chronic pain: Secondary | ICD-10-CM

## 2018-01-01 DIAGNOSIS — G4709 Other insomnia: Secondary | ICD-10-CM

## 2018-01-01 DIAGNOSIS — J449 Chronic obstructive pulmonary disease, unspecified: Secondary | ICD-10-CM

## 2018-01-01 DIAGNOSIS — F17200 Nicotine dependence, unspecified, uncomplicated: Secondary | ICD-10-CM | POA: Diagnosis not present

## 2018-01-01 DIAGNOSIS — G4700 Insomnia, unspecified: Secondary | ICD-10-CM

## 2018-01-01 DIAGNOSIS — G894 Chronic pain syndrome: Secondary | ICD-10-CM | POA: Diagnosis not present

## 2018-01-01 DIAGNOSIS — M4716 Other spondylosis with myelopathy, lumbar region: Secondary | ICD-10-CM | POA: Diagnosis not present

## 2018-01-01 MED ORDER — ALBUTEROL SULFATE HFA 108 (90 BASE) MCG/ACT IN AERS: 2.0000 | INHALATION_SPRAY | Freq: Four times a day (QID) | RESPIRATORY_TRACT | 2 refills | Status: DC | PRN

## 2018-01-01 MED ORDER — TRAZODONE HCL 150 MG PO TABS: 300.0000 mg | ORAL_TABLET | Freq: Every day | ORAL | 0 refills | Status: DC

## 2018-01-01 MED ORDER — OXYCODONE-ACETAMINOPHEN 5-325 MG PO TABS: 1.0000 | ORAL_TABLET | Freq: Every day | ORAL | 0 refills | Status: DC | PRN

## 2018-01-01 MED ORDER — ROPINIROLE HCL 0.25 MG PO TABS: 0.5000 mg | ORAL_TABLET | Freq: Every day | ORAL | 0 refills | Status: DC

## 2018-01-01 MED ORDER — MELOXICAM 7.5 MG PO TABS: 7.5000 mg | ORAL_TABLET | Freq: Every day | ORAL | 0 refills | Status: DC

## 2018-01-01 MED ORDER — BACLOFEN 10 MG PO TABS: ORAL_TABLET | ORAL | 0 refills | Status: DC

## 2018-01-01 NOTE — Assessment & Plan Note (Addendum)
Chronic.  Currently undergoing opioid wean following failed urine drug screen x2.  Patient insistent on receiving alternative medications such as additional muscle relaxer.  This does not seem to be limiting patient given recent yard work pulling debris out of the pool as of yesterday. - Continue oxycodone wean over the next 3 months - Refill baclofen 10 mg daily as needed, Mobic 7.5 mg daily - Referral to pain clinic - RTC 3 months

## 2018-01-01 NOTE — Patient Instructions (Signed)
Thank you for coming in to see [place] today. Please see below to review our plan for today's visit.  1.  I placed a referral you for you to see a pain specialist.  I will continue weaning your oxycodone. 2.  Your coughing is likely from her smoking.  It would be best if you have some pulmonary function testing.  I prescribed you an albuterol inhaler to help with your breathing.  It would be best for your health if you stop smoking.  Please call 1-800-QUIT-NOW for free resources.  You can let me know if you are interested in stopping, we have other options here to help you get off cigarette smoking.  Please call the clinic at [phone] if your symptoms worsen or you have any concerns. It was our pleasure to serve you.  Harriet Butte, [place], PGY-2

## 2018-01-01 NOTE — Telephone Encounter (Signed)
Tamera Punt at [facility] needs clarification on the fill dates of the 3 oxycodone prescriptions received today.  One is dated do not fill before 12/15/17 which is a past date.  The other two are dated 01/15/18 and 02/15/18. Please clarify and resend.   Call back for questions is [phone].  Danley Danker, RN [facility] RN)

## 2018-01-01 NOTE — Assessment & Plan Note (Addendum)
Chronic.  No signs of acute exacerbation.  Not currently on controller medication.  Does not have emergency inhaler.  Mild expiratory wheeze on apical region bilaterally.  Patient endorsing nonproductive cough intermittently without cough during evaluation.  Current everyday smoker.  Patient clearly resistant to smoking cessation and additional therapies. - Offered albuterol treatment, declined - Given prescription for albuterol inhaler - Declined PFT

## 2018-01-01 NOTE — Assessment & Plan Note (Addendum)
Chronic.  Current everyday smoker.  Cigarettes 1 pack/day. - Patient declined PFT, low contrast CT screen - Advised patient to stop smoking

## 2018-01-28 ENCOUNTER — Other Ambulatory Visit: Payer: Self-pay | Admitting: Family Medicine

## 2018-01-28 DIAGNOSIS — M4716 Other spondylosis with myelopathy, lumbar region: Secondary | ICD-10-CM

## 2018-01-28 DIAGNOSIS — G2581 Restless legs syndrome: Secondary | ICD-10-CM

## 2018-01-28 DIAGNOSIS — G894 Chronic pain syndrome: Secondary | ICD-10-CM

## 2018-03-03 ENCOUNTER — Other Ambulatory Visit: Payer: Self-pay | Admitting: Family Medicine

## 2018-03-03 DIAGNOSIS — G4709 Other insomnia: Secondary | ICD-10-CM

## 2018-03-03 DIAGNOSIS — M4716 Other spondylosis with myelopathy, lumbar region: Secondary | ICD-10-CM

## 2018-03-03 DIAGNOSIS — G2581 Restless legs syndrome: Secondary | ICD-10-CM

## 2018-03-04 ENCOUNTER — Other Ambulatory Visit: Payer: Self-pay

## 2018-03-04 DIAGNOSIS — G894 Chronic pain syndrome: Secondary | ICD-10-CM

## 2018-03-04 DIAGNOSIS — M4716 Other spondylosis with myelopathy, lumbar region: Secondary | ICD-10-CM

## 2018-03-04 MED ORDER — BACLOFEN 10 MG PO TABS: 10.0000 mg | ORAL_TABLET | Freq: Every day | ORAL | 0 refills | Status: DC | PRN

## 2018-03-31 ENCOUNTER — Other Ambulatory Visit: Payer: Self-pay | Admitting: Family Medicine

## 2018-03-31 DIAGNOSIS — G4709 Other insomnia: Secondary | ICD-10-CM

## 2018-03-31 DIAGNOSIS — M4716 Other spondylosis with myelopathy, lumbar region: Secondary | ICD-10-CM

## 2018-03-31 DIAGNOSIS — G894 Chronic pain syndrome: Secondary | ICD-10-CM

## 2018-04-01 ENCOUNTER — Other Ambulatory Visit: Payer: Self-pay

## 2018-04-01 ENCOUNTER — Encounter: Payer: Self-pay | Admitting: Family Medicine

## 2018-04-01 ENCOUNTER — Ambulatory Visit (INDEPENDENT_AMBULATORY_CARE_PROVIDER_SITE_OTHER): Payer: Medicare Other | Admitting: Family Medicine

## 2018-04-01 VITALS — BP 138/72 | HR 76 | Ht 72.0 in | Wt 209.0 lb

## 2018-04-01 DIAGNOSIS — J301 Allergic rhinitis due to pollen: Secondary | ICD-10-CM

## 2018-04-01 DIAGNOSIS — I10 Essential (primary) hypertension: Secondary | ICD-10-CM

## 2018-04-01 DIAGNOSIS — N529 Male erectile dysfunction, unspecified: Secondary | ICD-10-CM

## 2018-04-01 DIAGNOSIS — F17200 Nicotine dependence, unspecified, uncomplicated: Secondary | ICD-10-CM

## 2018-04-01 MED ORDER — SILDENAFIL CITRATE 100 MG PO TABS: 50.0000 mg | ORAL_TABLET | ORAL | 0 refills | Status: DC | PRN

## 2018-04-01 MED ORDER — FLUTICASONE PROPIONATE 50 MCG/ACT NA SUSP: 1.0000 | Freq: Every day | NASAL | 11 refills | Status: DC

## 2018-04-01 NOTE — Assessment & Plan Note (Signed)
Chronic.  Patient endorses ability to achieve erection.  Interested in [place].  No history of nitrate use. - Given paper prescription for Viagra 100 mg, 20 tablets, no refills

## 2018-04-01 NOTE — Patient Instructions (Signed)
Thank you for coming in to see [place] today. Please see below to review our plan for today's visit.  1.  We will check your lab work today.  I will call you if there are any abnormalities otherwise you will receive results in the mail. 2.  I sent in a refill for your Flonase. 3.  The sildenafil (Viagra) will likely be expensive and she will have to pay out-of-pocket.  You may want to try [facility] to fill the prescription. 4.  I would encourage you to stop smoking as this will be better for your health and will improve your erectile dysfunction.  Please call the clinic at [phone] if your symptoms worsen or you have any concerns. It was our pleasure to serve you.  Harriet Butte, [place], PGY-2

## 2018-04-01 NOTE — Progress Notes (Signed)
   Subjective   Patient ID: Todd Hayes    DOB: [birth year], 62 y.o. male   MRN: [id number]  CC: "I need the purple pill"  HPI: Todd Hayes is a 62 y.o. male who presents to clinic today for the following:  Erectile dysfunction: Patient endorsing erectile dysfunction for several years.  He feels that he needs this desperately to maintain his relationship.  He has tried the 20 mg sildenafil tablets in the past but felt like they "did not work."  He was also given a prescription free Cialis but did not fill the prescription due to cost.  He is willing to pay out-of-pocket for a full 100 mg sildenafil tablets today.  He does not take nitrates.  Hypertension: Patient takes lisinopril daily.  He is an everyday smoker.  He also has known hyperlipidemia but does not take a statin or daily aspirin.  Allergies: Patient takes Flonase regularly with relief of symptoms.  Denies any fevers or chills, cough, nasal discharge, sinus congestion, ear pain, sore throat, shortness of breath.  He is here today for a refill of his Flonase.  He is an everyday smoker.  Smoking: Patient continues to smoke 1 pack/day.  He is not interested in smoking cessation.  He denies any chest pain or shortness of breath.  ROS: see HPI for pertinent.  Todd Hayes: COPD, HTN, GERD, lumbar spondylosis w/ myelopathy, tobacco abuse, chronic narcotic use now weaning due to failed UDS.  Surgical history right-shoulder.  Family history unremarkable.  Smoking status reviewed.  Medications reviewed.  Objective   BP 138/72   Pulse 76   Ht 6' (1.829 m)   Wt 209 lb (94.8 kg)   SpO2 97%   BMI 28.35 kg/m  Vitals and nursing note reviewed.  General: well nourished, well developed, NAD with non-toxic appearance HEENT: normocephalic, atraumatic, moist mucous membranes Neck: supple, non-tender without lymphadenopathy Cardiovascular: regular rate and rhythm without murmurs, rubs, or gallops Lungs: diffuse wheezing throughout with good air  movement with normal work of breathing Abdomen: soft, non-tender, non-distended, normoactive bowel sounds Skin: warm, dry, no rashes or lesions, cap refill < 2 seconds Extremities: warm and well perfused, normal tone, no edema  Assessment & Plan   Tobacco use disorder Chronic.  Patient not ready for cessation. - Provided tobacco cessation counseling  Primary hypertension Chronic.  Controlled.  Everyday smoker. - Continue lisinopril 10 mg daily - Checking CMET, CBC with differential, LDL  Erectile dysfunction Chronic.  Patient endorses ability to achieve erection.  Interested in [place].  No history of nitrate use. - Given paper prescription for Viagra 100 mg, 20 tablets, no refills  Orders Placed This Encounter  Procedures  . CMP14+EGFR  . CBC with Differential/Platelet  . LDL cholesterol, direct   Meds ordered this encounter  Medications  . sildenafil (VIAGRA) 100 MG tablet    Sig: Take 0.5 tablets (50 mg total) by mouth as needed for erectile dysfunction.    Dispense:  20 tablet    Refill:  0  . fluticasone (FLONASE) 50 MCG/ACT nasal spray    Sig: Place 1 spray into both nostrils daily.    Dispense:  16 g    Refill:  [place], [place], PGY-2 04/01/2018, 4:50 PM

## 2018-04-01 NOTE — Assessment & Plan Note (Signed)
Chronic.  Patient not ready for cessation. - Provided tobacco cessation counseling

## 2018-04-01 NOTE — Assessment & Plan Note (Addendum)
Chronic.  Controlled.  Everyday smoker. - Continue lisinopril 10 mg daily - Checking CMET, CBC with differential, LDL

## 2018-04-02 ENCOUNTER — Encounter: Payer: Self-pay | Admitting: Family Medicine

## 2018-04-02 ENCOUNTER — Other Ambulatory Visit: Payer: Self-pay | Admitting: Family Medicine

## 2018-04-02 DIAGNOSIS — G2581 Restless legs syndrome: Secondary | ICD-10-CM

## 2018-04-02 LAB — CMP14+EGFR
A/G RATIO: 1.8 (ref 1.2–2.2)
ALT: 19 IU/L (ref 0–44)
AST: 23 IU/L (ref 0–40)
Albumin: 4.3 g/dL (ref 3.6–4.8)
Alkaline Phosphatase: 87 IU/L (ref 39–117)
BUN/Creatinine Ratio: 13 (ref 10–24)
BUN: 19 mg/dL (ref 8–27)
Bilirubin Total: 0.4 mg/dL (ref 0.0–1.2)
CALCIUM: 9.4 mg/dL (ref 8.6–10.2)
CO2: 23 mmol/L (ref 20–29)
Chloride: 103 mmol/L (ref 96–106)
Creatinine, Ser: 1.41 mg/dL — ABNORMAL HIGH (ref 0.76–1.27)
GFR, EST AFRICAN AMERICAN: 62 mL/min/{1.73_m2} (ref >59)
GFR, EST NON AFRICAN AMERICAN: 54 mL/min/{1.73_m2} — AB (ref >59)
GLUCOSE: 99 mg/dL (ref 65–99)
Globulin, Total: 2.4 g/dL (ref 1.5–4.5)
POTASSIUM: 5.2 mmol/L (ref 3.5–5.2)
Sodium: 141 mmol/L (ref 134–144)
TOTAL PROTEIN: 6.7 g/dL (ref 6.0–8.5)

## 2018-04-02 LAB — CBC WITH DIFFERENTIAL/PLATELET
BASOS ABS: 0 10*3/uL (ref 0.0–0.2)
BASOS: 1 %
EOS (ABSOLUTE): 0.1 10*3/uL (ref 0.0–0.4)
Eos: 1 %
Hematocrit: 42 % (ref 37.5–51.0)
Hemoglobin: 14.8 g/dL (ref 13.0–17.7)
IMMATURE GRANS (ABS): 0 10*3/uL (ref 0.0–0.1)
IMMATURE GRANULOCYTES: 0 %
LYMPHS: 34 %
Lymphocytes Absolute: 2 10*3/uL (ref 0.7–3.1)
MCH: 35.2 pg — ABNORMAL HIGH (ref 26.6–33.0)
MCHC: 35.2 g/dL (ref 31.5–35.7)
MCV: 100 fL — AB (ref 79–97)
MONOCYTES: 10 %
Monocytes Absolute: 0.6 10*3/uL (ref 0.1–0.9)
NEUTROS ABS: 3.2 10*3/uL (ref 1.4–7.0)
NEUTROS PCT: 54 %
PLATELETS: 167 10*3/uL (ref 150–450)
RBC: 4.2 x10E6/uL (ref 4.14–5.80)
RDW: 13.3 % (ref 12.3–15.4)
WBC: 5.9 10*3/uL (ref 3.4–10.8)

## 2018-04-02 LAB — LDL CHOLESTEROL, DIRECT: LDL DIRECT: 136 mg/dL — AB (ref 0–99)

## 2018-04-29 ENCOUNTER — Other Ambulatory Visit: Payer: Self-pay | Admitting: Family Medicine

## 2018-04-29 DIAGNOSIS — M4716 Other spondylosis with myelopathy, lumbar region: Secondary | ICD-10-CM

## 2018-04-29 DIAGNOSIS — G4709 Other insomnia: Secondary | ICD-10-CM

## 2018-04-29 DIAGNOSIS — G2581 Restless legs syndrome: Secondary | ICD-10-CM

## 2018-05-02 ENCOUNTER — Other Ambulatory Visit: Payer: Self-pay

## 2018-05-02 DIAGNOSIS — G894 Chronic pain syndrome: Secondary | ICD-10-CM

## 2018-05-02 DIAGNOSIS — M4716 Other spondylosis with myelopathy, lumbar region: Secondary | ICD-10-CM

## 2018-05-02 MED ORDER — BACLOFEN 10 MG PO TABS: ORAL_TABLET | ORAL | 0 refills | Status: DC

## 2018-05-26 ENCOUNTER — Other Ambulatory Visit: Payer: Self-pay | Admitting: Family Medicine

## 2018-05-26 DIAGNOSIS — G4709 Other insomnia: Secondary | ICD-10-CM

## 2018-05-26 DIAGNOSIS — G894 Chronic pain syndrome: Secondary | ICD-10-CM

## 2018-05-26 DIAGNOSIS — M4716 Other spondylosis with myelopathy, lumbar region: Secondary | ICD-10-CM

## 2018-05-26 DIAGNOSIS — K219 Gastro-esophageal reflux disease without esophagitis: Secondary | ICD-10-CM

## 2018-06-04 ENCOUNTER — Other Ambulatory Visit: Payer: Self-pay | Admitting: Family Medicine

## 2018-06-04 DIAGNOSIS — G2581 Restless legs syndrome: Secondary | ICD-10-CM

## 2018-06-24 ENCOUNTER — Other Ambulatory Visit: Payer: Self-pay | Admitting: Family Medicine

## 2018-06-24 DIAGNOSIS — G4709 Other insomnia: Secondary | ICD-10-CM

## 2018-06-24 DIAGNOSIS — G894 Chronic pain syndrome: Secondary | ICD-10-CM

## 2018-06-24 DIAGNOSIS — M4716 Other spondylosis with myelopathy, lumbar region: Secondary | ICD-10-CM

## 2018-06-25 ENCOUNTER — Other Ambulatory Visit: Payer: Self-pay | Admitting: Family Medicine

## 2018-06-25 DIAGNOSIS — G2581 Restless legs syndrome: Secondary | ICD-10-CM

## 2018-07-22 ENCOUNTER — Other Ambulatory Visit: Payer: Self-pay

## 2018-07-22 DIAGNOSIS — G894 Chronic pain syndrome: Secondary | ICD-10-CM

## 2018-07-22 DIAGNOSIS — K219 Gastro-esophageal reflux disease without esophagitis: Secondary | ICD-10-CM

## 2018-07-22 DIAGNOSIS — G2581 Restless legs syndrome: Secondary | ICD-10-CM

## 2018-07-22 DIAGNOSIS — M4716 Other spondylosis with myelopathy, lumbar region: Secondary | ICD-10-CM

## 2018-07-22 DIAGNOSIS — G4709 Other insomnia: Secondary | ICD-10-CM

## 2018-07-22 MED ORDER — TRAZODONE HCL 150 MG PO TABS: 300.0000 mg | ORAL_TABLET | Freq: Every day | ORAL | 0 refills | Status: DC

## 2018-07-22 MED ORDER — PANTOPRAZOLE SODIUM 20 MG PO TBEC: 20.0000 mg | DELAYED_RELEASE_TABLET | Freq: Every day | ORAL | 3 refills | Status: DC

## 2018-07-22 MED ORDER — BACLOFEN 10 MG PO TABS: ORAL_TABLET | ORAL | 0 refills | Status: DC

## 2018-07-22 MED ORDER — ROPINIROLE HCL 0.25 MG PO TABS: 0.5000 mg | ORAL_TABLET | Freq: Every day | ORAL | 0 refills | Status: DC

## 2018-07-22 MED ORDER — MELOXICAM 7.5 MG PO TABS: 7.5000 mg | ORAL_TABLET | Freq: Every day | ORAL | 0 refills | Status: DC

## 2018-08-16 ENCOUNTER — Other Ambulatory Visit: Payer: Self-pay | Admitting: Family Medicine

## 2018-08-16 DIAGNOSIS — G894 Chronic pain syndrome: Secondary | ICD-10-CM

## 2018-08-16 DIAGNOSIS — M4716 Other spondylosis with myelopathy, lumbar region: Secondary | ICD-10-CM

## 2018-08-16 DIAGNOSIS — G2581 Restless legs syndrome: Secondary | ICD-10-CM

## 2018-08-17 ENCOUNTER — Other Ambulatory Visit: Payer: Self-pay | Admitting: Family Medicine

## 2018-08-17 DIAGNOSIS — G4709 Other insomnia: Secondary | ICD-10-CM

## 2018-09-10 ENCOUNTER — Other Ambulatory Visit: Payer: Self-pay | Admitting: Family Medicine

## 2018-09-10 DIAGNOSIS — M4716 Other spondylosis with myelopathy, lumbar region: Secondary | ICD-10-CM

## 2018-09-10 DIAGNOSIS — G2581 Restless legs syndrome: Secondary | ICD-10-CM

## 2018-09-10 DIAGNOSIS — G894 Chronic pain syndrome: Secondary | ICD-10-CM

## 2018-09-11 ENCOUNTER — Other Ambulatory Visit: Payer: Self-pay | Admitting: Family Medicine

## 2018-09-11 DIAGNOSIS — M4716 Other spondylosis with myelopathy, lumbar region: Secondary | ICD-10-CM

## 2018-10-08 ENCOUNTER — Other Ambulatory Visit: Payer: Self-pay | Admitting: Family Medicine

## 2018-10-08 DIAGNOSIS — G894 Chronic pain syndrome: Secondary | ICD-10-CM

## 2018-10-08 DIAGNOSIS — M4716 Other spondylosis with myelopathy, lumbar region: Secondary | ICD-10-CM

## 2018-10-12 ENCOUNTER — Other Ambulatory Visit: Payer: Self-pay | Admitting: Family Medicine

## 2018-10-12 DIAGNOSIS — M4716 Other spondylosis with myelopathy, lumbar region: Secondary | ICD-10-CM

## 2018-11-06 ENCOUNTER — Other Ambulatory Visit: Payer: Self-pay | Admitting: Family Medicine

## 2018-11-06 DIAGNOSIS — M4716 Other spondylosis with myelopathy, lumbar region: Secondary | ICD-10-CM

## 2018-11-06 DIAGNOSIS — G894 Chronic pain syndrome: Secondary | ICD-10-CM

## 2018-11-10 ENCOUNTER — Other Ambulatory Visit: Payer: Self-pay | Admitting: Family Medicine

## 2018-11-10 DIAGNOSIS — M4716 Other spondylosis with myelopathy, lumbar region: Secondary | ICD-10-CM

## 2018-11-10 DIAGNOSIS — I10 Essential (primary) hypertension: Secondary | ICD-10-CM

## 2018-11-11 ENCOUNTER — Other Ambulatory Visit: Payer: Self-pay | Admitting: Family Medicine

## 2018-11-11 DIAGNOSIS — G4709 Other insomnia: Secondary | ICD-10-CM

## 2018-12-03 ENCOUNTER — Other Ambulatory Visit: Payer: Self-pay | Admitting: Family Medicine

## 2018-12-03 DIAGNOSIS — G894 Chronic pain syndrome: Secondary | ICD-10-CM

## 2018-12-03 DIAGNOSIS — M4716 Other spondylosis with myelopathy, lumbar region: Secondary | ICD-10-CM

## 2018-12-03 MED ORDER — BACLOFEN 10 MG PO TABS: 10.0000 mg | ORAL_TABLET | Freq: Three times a day (TID) | ORAL | 0 refills | Status: DC

## 2018-12-04 ENCOUNTER — Other Ambulatory Visit: Payer: Self-pay | Admitting: Family Medicine

## 2018-12-04 DIAGNOSIS — M4716 Other spondylosis with myelopathy, lumbar region: Secondary | ICD-10-CM

## 2018-12-09 ENCOUNTER — Other Ambulatory Visit: Payer: Self-pay

## 2018-12-09 DIAGNOSIS — N529 Male erectile dysfunction, unspecified: Secondary | ICD-10-CM

## 2018-12-09 MED ORDER — SILDENAFIL CITRATE 100 MG PO TABS: 50.0000 mg | ORAL_TABLET | ORAL | 0 refills | Status: DC | PRN

## 2018-12-17 ENCOUNTER — Telehealth: Payer: Self-pay

## 2018-12-17 ENCOUNTER — Other Ambulatory Visit: Payer: Self-pay | Admitting: Family Medicine

## 2018-12-17 MED ORDER — SILDENAFIL CITRATE 20 MG PO TABS: ORAL_TABLET | ORAL | 0 refills | Status: DC

## 2018-12-17 NOTE — Telephone Encounter (Signed)
Prescription sent to pharmacy.  [place], [place], PGY-3

## 2018-12-17 NOTE — Telephone Encounter (Signed)
Received fax from pharmacy, PA needed on Sildenafil. According to website, maximum is 3 per day; PA needed for 5 per day. Clinical questions submitted via Cover My Meds. Waiting on response, could take up to 72 hours.  Cover My Meds info: Key: [id number]   Danley Danker, RN [facility] RN)

## 2018-12-17 NOTE — Telephone Encounter (Signed)
Informed patient.  Todd Hayes, Todd Hayes

## 2018-12-17 NOTE — Telephone Encounter (Signed)
Fax from pharmacy stating patient previously getting Sildenafil 100 mg at [facility] but now this is over $200.  Request Sildenafil 20 mg tablet; take 5 tablets by mouth 1 hour before needed; do not use more than once daily  #25  Danley Danker, RN [facility] RN)

## 2018-12-18 NOTE — Telephone Encounter (Signed)
Fax from [facility] with explanation: "Drugs when used for the treatment of sexual dysfunction are excluded from coverage under medicare rules."  Danley Danker, RN [facility] RN)

## 2018-12-18 NOTE — Telephone Encounter (Signed)
Denied per CoverMyMeds. Pharmacy notified. Danley Danker, RN [facility] RN)

## 2018-12-26 ENCOUNTER — Other Ambulatory Visit: Payer: Self-pay | Admitting: Family Medicine

## 2018-12-27 ENCOUNTER — Other Ambulatory Visit: Payer: Self-pay | Admitting: Family Medicine

## 2018-12-27 DIAGNOSIS — M4716 Other spondylosis with myelopathy, lumbar region: Secondary | ICD-10-CM

## 2018-12-27 DIAGNOSIS — G894 Chronic pain syndrome: Secondary | ICD-10-CM

## 2018-12-28 ENCOUNTER — Other Ambulatory Visit: Payer: Self-pay | Admitting: Family Medicine

## 2018-12-28 DIAGNOSIS — M4716 Other spondylosis with myelopathy, lumbar region: Secondary | ICD-10-CM

## 2019-01-20 ENCOUNTER — Other Ambulatory Visit: Payer: Self-pay | Admitting: Family Medicine

## 2019-01-20 DIAGNOSIS — G894 Chronic pain syndrome: Secondary | ICD-10-CM

## 2019-01-20 DIAGNOSIS — M4716 Other spondylosis with myelopathy, lumbar region: Secondary | ICD-10-CM

## 2019-01-20 DIAGNOSIS — I10 Essential (primary) hypertension: Secondary | ICD-10-CM

## 2019-01-20 DIAGNOSIS — G4709 Other insomnia: Secondary | ICD-10-CM

## 2019-03-01 ENCOUNTER — Other Ambulatory Visit: Payer: Self-pay | Admitting: Family Medicine

## 2019-03-01 DIAGNOSIS — G894 Chronic pain syndrome: Secondary | ICD-10-CM

## 2019-03-01 DIAGNOSIS — M4716 Other spondylosis with myelopathy, lumbar region: Secondary | ICD-10-CM

## 2019-03-11 ENCOUNTER — Other Ambulatory Visit: Payer: Self-pay | Admitting: Family Medicine

## 2019-03-11 DIAGNOSIS — M4716 Other spondylosis with myelopathy, lumbar region: Secondary | ICD-10-CM

## 2019-04-02 ENCOUNTER — Other Ambulatory Visit: Payer: Self-pay | Admitting: Family Medicine

## 2019-04-02 DIAGNOSIS — G894 Chronic pain syndrome: Secondary | ICD-10-CM

## 2019-04-02 DIAGNOSIS — M4716 Other spondylosis with myelopathy, lumbar region: Secondary | ICD-10-CM

## 2019-04-04 ENCOUNTER — Other Ambulatory Visit: Payer: Self-pay | Admitting: Family Medicine

## 2019-04-04 DIAGNOSIS — M4716 Other spondylosis with myelopathy, lumbar region: Secondary | ICD-10-CM

## 2019-04-16 ENCOUNTER — Other Ambulatory Visit: Payer: Self-pay | Admitting: Family Medicine

## 2019-04-16 DIAGNOSIS — I10 Essential (primary) hypertension: Secondary | ICD-10-CM

## 2019-04-16 DIAGNOSIS — G4709 Other insomnia: Secondary | ICD-10-CM

## 2019-04-26 ENCOUNTER — Other Ambulatory Visit: Payer: Self-pay | Admitting: Family Medicine

## 2019-04-26 DIAGNOSIS — M4716 Other spondylosis with myelopathy, lumbar region: Secondary | ICD-10-CM

## 2019-04-26 DIAGNOSIS — G894 Chronic pain syndrome: Secondary | ICD-10-CM

## 2019-04-29 ENCOUNTER — Other Ambulatory Visit: Payer: Self-pay | Admitting: Family Medicine

## 2019-04-29 DIAGNOSIS — M4716 Other spondylosis with myelopathy, lumbar region: Secondary | ICD-10-CM

## 2019-05-21 ENCOUNTER — Other Ambulatory Visit: Payer: Self-pay

## 2019-05-21 DIAGNOSIS — G894 Chronic pain syndrome: Secondary | ICD-10-CM

## 2019-05-21 DIAGNOSIS — M4716 Other spondylosis with myelopathy, lumbar region: Secondary | ICD-10-CM

## 2019-05-24 MED ORDER — BACLOFEN 10 MG PO TABS: ORAL_TABLET | ORAL | 0 refills | Status: DC

## 2019-05-24 NOTE — Telephone Encounter (Signed)
Will refill this time, but please have patient make a yearly appointment at earliest convenience as it has been >1 year since his last visit.

## 2019-05-25 NOTE — Telephone Encounter (Signed)
Pt informed and scheduled for a appt. Todd Hayes Kennon Holter, CMA

## 2019-06-02 ENCOUNTER — Other Ambulatory Visit: Payer: Self-pay

## 2019-06-02 DIAGNOSIS — M4716 Other spondylosis with myelopathy, lumbar region: Secondary | ICD-10-CM

## 2019-06-02 MED ORDER — MELOXICAM 7.5 MG PO TABS: 7.5000 mg | ORAL_TABLET | Freq: Every day | ORAL | 0 refills | Status: DC

## 2019-06-02 NOTE — Telephone Encounter (Signed)
I will refill at this time, however patient will need to be scheduled for annual exam at earliest convenience as it has been >1 year since last visit. Thank you.

## 2019-06-02 NOTE — Telephone Encounter (Signed)
Disregard. Patient is scheduled for 7/28 with me

## 2019-06-08 ENCOUNTER — Other Ambulatory Visit: Payer: Self-pay

## 2019-06-08 DIAGNOSIS — J301 Allergic rhinitis due to pollen: Secondary | ICD-10-CM

## 2019-06-08 MED ORDER — FLUTICASONE PROPIONATE 50 MCG/ACT NA SUSP: 1.0000 | Freq: Every day | NASAL | 11 refills | Status: DC

## 2019-06-09 ENCOUNTER — Ambulatory Visit (INDEPENDENT_AMBULATORY_CARE_PROVIDER_SITE_OTHER): Payer: Medicare Other | Admitting: Family Medicine

## 2019-06-09 ENCOUNTER — Encounter: Payer: Self-pay | Admitting: Family Medicine

## 2019-06-09 ENCOUNTER — Other Ambulatory Visit: Payer: Self-pay

## 2019-06-09 VITALS — BP 118/70 | HR 73 | Temp 98.5°F | Wt 212.0 lb

## 2019-06-09 DIAGNOSIS — F17200 Nicotine dependence, unspecified, uncomplicated: Secondary | ICD-10-CM | POA: Diagnosis not present

## 2019-06-09 DIAGNOSIS — N529 Male erectile dysfunction, unspecified: Secondary | ICD-10-CM

## 2019-06-09 DIAGNOSIS — J449 Chronic obstructive pulmonary disease, unspecified: Secondary | ICD-10-CM

## 2019-06-09 DIAGNOSIS — M4716 Other spondylosis with myelopathy, lumbar region: Secondary | ICD-10-CM | POA: Diagnosis not present

## 2019-06-09 DIAGNOSIS — K219 Gastro-esophageal reflux disease without esophagitis: Secondary | ICD-10-CM | POA: Diagnosis not present

## 2019-06-09 DIAGNOSIS — I10 Essential (primary) hypertension: Secondary | ICD-10-CM | POA: Diagnosis not present

## 2019-06-09 DIAGNOSIS — G4700 Insomnia, unspecified: Secondary | ICD-10-CM

## 2019-06-09 DIAGNOSIS — E782 Mixed hyperlipidemia: Secondary | ICD-10-CM

## 2019-06-09 DIAGNOSIS — G894 Chronic pain syndrome: Secondary | ICD-10-CM

## 2019-06-09 DIAGNOSIS — Z716 Tobacco abuse counseling: Secondary | ICD-10-CM

## 2019-06-09 MED ORDER — SILDENAFIL CITRATE 20 MG PO TABS: ORAL_TABLET | ORAL | 0 refills | Status: DC

## 2019-06-09 NOTE — Assessment & Plan Note (Addendum)
Chronic and well controlled. Continues to be an everyday smoker.  - Continue Lisinopril 10mg  QD - CMP to evaluate kidney function, Lipid panel ordered today

## 2019-06-09 NOTE — Assessment & Plan Note (Addendum)
Elevated LDL to 136 in 2019. Not currently on statin therapy.  - obtain lipid panel today

## 2019-06-09 NOTE — Assessment & Plan Note (Addendum)
Chronic. MRI from 2010 reviewed and notable for tiny disc protrusion at L1-L2 and L3-L4 with some nerve contact at L3 and mild lateral recess narrowing bilaterally at L4-5. Currently endorses pain after long day of work which involves lots of bending and tight quarters. Denies radiculopathy. No current pain. Exam completely benign today. Patient weaned from opioids last year due to failed urine drug screen x2. PDMP reviewed. Opioids last filled in 2019. Currently treating with Mobic 7.5mg  QD and Baclofen 10mg  QD with occasional OTC topical analgesics. Refuses OTC oral analgesics as they all cause side effects. Repeatedly requested Tramadol throughout today's encounter. Offered physical therapy but patient declined this as well.  - at this time will not offer Tramadol given patient's history and intermittent pain - If willing patient can try OTC Voltaren gel, Capsaicin or Icey-Hot patches with Lidocaine - CMP to evaluate kidney function given long term use of Mobic - continue conservative measures. Consider repeat MRI and/or referral to neurosurgeon if worsens

## 2019-06-09 NOTE — Progress Notes (Signed)
Subjective:   Patient ID: Todd Hayes    DOB: Jun 28, 1956, 63 y.o. male   MRN: [id number]  Todd Hayes is a 63 y.o. male with a history of chronic back pain, HTN, tobacco use, erectile dysfunction, COPD, GERD here for health maintenance of chronic diseases.  HTN:  BP today 118/70. Currently taking Lisinopril 10mg  QD. Denies any chest pain, SOB, vision changes, or headaches.   Tobacco Abuse: Smokes 1PPD of [place] 100's. He notes he has cut back about 4 cigarettes. He notes he smokes to calm his nerves down. He sometimes goes outside to smoke, and he smokes half a cigarette then will come back later to smoke the other half.   Erectile Dysfunction:  He notes he had a hard time obtaining an erection. It will start but has a hard time getting completely erect. Denies night time/morning erections. Does note some improvement with Sildenafil.   GERD:  Currently taking Protonix 20mg  QD. Helps with his acid reflux. Denies any medication side effects such as headache, nausea, vomiting. Denies any abdominal pain or difficulty swallowing.   Chronic low back pain: Patient complaining of low back pain. He has been on Oxycodone in the past but was weaned off due to failed urine drug tests. He continues to request Tramadol. He is currently treating his pain with Mobic and aspercream. 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 He notes he has numbness/tingling in his toes bilaterally which is chronic and unchanged. Denies any radiculopathy. He also notes wearing a back brace which helps. When asked about tylenol or other OTC medications, he states all over the counter medicines "give him the jerks". He can only take prescription medications.  Insomnia: Currently takes Trazodone 300mg  qHS. He notes this is helping his sleep.   Health Maintenance: Due for colonoscopy and TDAP.  Review of Systems:  Per HPI.   [place], medications  and smoking status reviewed.  Objective:   BP 118/70   Pulse 73   Temp 98.5 F (36.9 C) (Oral)   Wt 212 lb (96.2 kg)   SpO2 95%   BMI 28.75 kg/m  Vitals and nursing note reviewed.  General: well nourished, well developed, in no acute distress with non-toxic appearance, sitting comfortably in exam chair HEENT: normocephalic, atraumatic CV: regular rate and rhythm without murmurs, rubs, or gallops, no lower extremity edema Lungs: clear to auscultation bilaterally with normal work of breathing on room air, no wheezing appreciated, mild scattered rhonchi appreciated Abdomen: soft, non-tender, non-distended, normoactive bowel sounds Skin: warm, dry Extremities: warm and well perfused MSK: Entire back without gross deformity or scoliosis. No midline or bony TTP. FROM without pain.   Neuro: Alert and oriented, speech normal  Assessment & Plan:   Primary hypertension Chronic and well controlled. Continues to be an everyday smoker.  - Continue Lisinopril 10mg  QD - CMP to evaluate kidney function, Lipid panel ordered today  COPD (chronic obstructive pulmonary disease) (HCC) Chronic, stable. Continues to be everyday smoker without desire for cessation. No wheezing appreciated on exam. Not on controller medication. Does have rescue inhaler.  - continue Albuterol PRN - At least 25% of the visit spent on smoking cessation counseling. Continues to decline - continue to monitor  GERD (gastroesophageal reflux disease) Chronic. Well controlled with Protonix 20mg  QD - continue current medication  Insomnia Chronic. Well controlled with Trazodone 300mg  qHS and Ropinirole 0.5mg  qHS. Denies any medication side effects - continue  current treatment  Erectile dysfunction Testosterone level WNL. Some improvement with Sildenafil. Requests refill. - refill Sildenafil 100mg    Hyperlipidemia Elevated LDL to 136 in 2019. Not currently on statin therapy.  - obtain lipid panel today  Lumbar  spondylosis with myelopathy Chronic. MRI from 2010 reviewed and notable for tiny disc protrusion at L1-L2 and L3-L4 with some nerve contact at L3 and mild lateral recess narrowing bilaterally at L4-5. Currently endorses pain after long day of work which involves lots of bending and tight quarters. Denies radiculopathy. No current pain. Exam completely benign today. Patient weaned from opioids last year due to failed urine drug screen x2. PDMP reviewed. Opioids last filled in 2019. Currently treating with Mobic 7.5mg  QD and Baclofen 10mg  QD with occasional OTC topical analgesics. Refuses OTC oral analgesics as they all cause side effects. Repeatedly requested Tramadol throughout today's encounter. Offered physical therapy but patient declined this as well.  - at this time will not offer Tramadol given patient's history and intermittent pain - If willing patient can try OTC Voltaren gel, Capsaicin or Icey-Hot patches with Lidocaine - CMP to evaluate kidney function given long term use of Mobic - continue conservative measures. Consider repeat MRI and/or referral to neurosurgeon if worsens   Health Maintenance: Due for TDAP and colonoscopy - Offered Colonoscopy but patient declined. Offered yearly FOBT but patient also declined - Patient open to TDAP. Given that patient has Medicare, patient will need to have it completed at pharmacy. Information provided to patient.   Orders Placed This Encounter  Procedures  . Comprehensive metabolic panel  . Lipid Panel   Meds ordered this encounter  Medications  . sildenafil (REVATIO) 20 MG tablet    Sig: TAKE 5 TABLETS BY MOUTH 1 HOUR BEFORE NEEDED DO NOT USE MORE THAN ONCE DAILY    Dispense:  25 tablet    Refill:  0    Mina Marble, DO PGY-2, [facility] 06/09/2019 10:13 PM

## 2019-06-09 NOTE — Assessment & Plan Note (Addendum)
Chronic, stable. Continues to be everyday smoker without desire for cessation. No wheezing appreciated on exam. Not on controller medication. Does have rescue inhaler.  - continue Albuterol PRN - At least 25% of the visit spent on smoking cessation counseling. Continues to decline - continue to monitor

## 2019-06-09 NOTE — Assessment & Plan Note (Signed)
Chronic. Well controlled with Protonix 20mg  QD - continue current medication

## 2019-06-09 NOTE — Assessment & Plan Note (Addendum)
Chronic. Well controlled with Trazodone 300mg  qHS and Ropinirole 0.5mg  qHS. Denies any medication side effects - continue current treatment

## 2019-06-09 NOTE — Assessment & Plan Note (Addendum)
Testosterone level WNL. Some improvement with Sildenafil. Requests refill. - refill Sildenafil 100mg 

## 2019-06-09 NOTE — Progress Notes (Deleted)
   Subjective:   Patient ID: Todd Hayes    DOB: 04-12-1956, 63 y.o. male   MRN: [id number]  Todd Hayes is a 63 y.o. male with a history of *** here for annual exam.  HTN:  BP today 118/70. Currently taking Lisinopril 10mg  QD   Tobacco use:  Erectile Dysfunction:   No problems updated. - ***  Review of Systems:  Per HPI.   [place], medications and smoking status reviewed.  Objective:   BP 118/70   Pulse 73   Temp 98.5 F (36.9 C) (Oral)   Wt 212 lb (96.2 kg)   SpO2 95%   BMI 28.75 kg/m  Vitals and nursing note reviewed.  General: well nourished, well developed, in no acute distress with non-toxic appearance HEENT: normocephalic, atraumatic, moist mucous membranes Neck: supple, non-tender without lymphadenopathy CV: regular rate and rhythm without murmurs, rubs, or gallops, no lower extremity edema Lungs: clear to auscultation bilaterally with normal work of breathing Abdomen: soft, non-tender, non-distended, no masses or organomegaly palpable, normoactive bowel sounds Skin: warm, dry, no rashes or lesions Extremities: warm and well perfused, normal tone MSK: ROM grossly intact, strength intact, gait normal Neuro: Alert and oriented, speech normal  Assessment & Plan:   No problem-specific Assessment & Plan notes found for this encounter.  No orders of the defined types were placed in this encounter.  No orders of the defined types were placed in this encounter.   Mina Marble, DO PGY-2, [facility] 06/09/2019 3:19 PM

## 2019-06-10 LAB — LIPID PANEL
Chol/HDL Ratio: 5.2 ratio — ABNORMAL HIGH (ref 0.0–5.0)
Cholesterol, Total: 234 mg/dL — ABNORMAL HIGH (ref 100–199)
HDL: 45 mg/dL (ref >39)
Triglycerides: 449 mg/dL — ABNORMAL HIGH (ref 0–149)

## 2019-06-10 LAB — COMPREHENSIVE METABOLIC PANEL
ALT: 21 IU/L (ref 0–44)
AST: 21 IU/L (ref 0–40)
Albumin/Globulin Ratio: 1.9 (ref 1.2–2.2)
Albumin: 4.2 g/dL (ref 3.8–4.8)
Alkaline Phosphatase: 75 IU/L (ref 39–117)
BUN/Creatinine Ratio: 15 (ref 10–24)
BUN: 24 mg/dL (ref 8–27)
Bilirubin Total: <0.2 mg/dL (ref 0.0–1.2)
CO2: 23 mmol/L (ref 20–29)
Calcium: 9 mg/dL (ref 8.6–10.2)
Chloride: 100 mmol/L (ref 96–106)
Creatinine, Ser: 1.58 mg/dL — ABNORMAL HIGH (ref 0.76–1.27)
GFR calc Af Amer: 53 mL/min/{1.73_m2} — ABNORMAL LOW (ref >59)
GFR calc non Af Amer: 46 mL/min/{1.73_m2} — ABNORMAL LOW (ref >59)
Globulin, Total: 2.2 g/dL (ref 1.5–4.5)
Glucose: 104 mg/dL — ABNORMAL HIGH (ref 65–99)
Potassium: 5.1 mmol/L (ref 3.5–5.2)
Sodium: 138 mmol/L (ref 134–144)
Total Protein: 6.4 g/dL (ref 6.0–8.5)

## 2019-06-16 ENCOUNTER — Other Ambulatory Visit: Payer: Self-pay | Admitting: Family Medicine

## 2019-06-16 DIAGNOSIS — M4716 Other spondylosis with myelopathy, lumbar region: Secondary | ICD-10-CM

## 2019-06-16 DIAGNOSIS — G894 Chronic pain syndrome: Secondary | ICD-10-CM

## 2019-06-20 ENCOUNTER — Telehealth: Payer: Self-pay | Admitting: Family Medicine

## 2019-06-20 DIAGNOSIS — E782 Mixed hyperlipidemia: Secondary | ICD-10-CM

## 2019-06-20 DIAGNOSIS — N183 Chronic kidney disease, stage 3 unspecified: Secondary | ICD-10-CM

## 2019-06-20 MED ORDER — OMEGA-3-ACID ETHYL ESTERS 1 G PO CAPS: 2.0000 g | ORAL_CAPSULE | Freq: Two times a day (BID) | ORAL | 5 refills | Status: AC

## 2019-06-20 NOTE — Telephone Encounter (Signed)
Spoke to patient to discuss lab results. Informed patient of elevated cholesterol and triglycerides. Per chart review, patient has been intolerant to multiple statins in the past. He has also reported GI upset with Fenofibrate. Discussed lifestyle changes and starting 4g Omega 3. Will start Lovaza and recheck lipid panel in 6 months. Also discussed elevated creatinine with GFR of 53. Appears minimally changed from year prior. Discussed tobacco cessation and recheck in 6 months. If continues to rise, will need to discuss discontinuation of Mobic which is used for chronic back pain. In attempt to avoid restarting opioids, will need to discuss alternative options if this is the case. Patient is already on Lisinopril 10mg . Patient to RTC in 4-6 months for follow up. Patient understood and agreed to plan.

## 2019-06-29 ENCOUNTER — Other Ambulatory Visit: Payer: Self-pay | Admitting: Family Medicine

## 2019-06-29 DIAGNOSIS — M4716 Other spondylosis with myelopathy, lumbar region: Secondary | ICD-10-CM

## 2019-07-09 ENCOUNTER — Other Ambulatory Visit: Payer: Self-pay

## 2019-07-09 DIAGNOSIS — I10 Essential (primary) hypertension: Secondary | ICD-10-CM

## 2019-07-09 MED ORDER — LISINOPRIL 10 MG PO TABS: 10.0000 mg | ORAL_TABLET | Freq: Every day | ORAL | 2 refills | Status: DC

## 2019-07-14 ENCOUNTER — Other Ambulatory Visit: Payer: Self-pay

## 2019-07-14 DIAGNOSIS — G4709 Other insomnia: Secondary | ICD-10-CM

## 2019-07-14 MED ORDER — TRAZODONE HCL 150 MG PO TABS: 300.0000 mg | ORAL_TABLET | Freq: Every day | ORAL | 0 refills | Status: DC

## 2019-07-28 ENCOUNTER — Other Ambulatory Visit: Payer: Self-pay | Admitting: *Deleted

## 2019-07-28 DIAGNOSIS — K219 Gastro-esophageal reflux disease without esophagitis: Secondary | ICD-10-CM

## 2019-07-28 MED ORDER — PANTOPRAZOLE SODIUM 20 MG PO TBEC: 20.0000 mg | DELAYED_RELEASE_TABLET | Freq: Every day | ORAL | 3 refills | Status: DC

## 2019-09-10 ENCOUNTER — Other Ambulatory Visit: Payer: Self-pay | Admitting: Family Medicine

## 2019-09-10 DIAGNOSIS — G894 Chronic pain syndrome: Secondary | ICD-10-CM

## 2019-09-10 DIAGNOSIS — M4716 Other spondylosis with myelopathy, lumbar region: Secondary | ICD-10-CM

## 2019-09-12 ENCOUNTER — Other Ambulatory Visit: Payer: Self-pay | Admitting: Family Medicine

## 2019-09-12 DIAGNOSIS — G4709 Other insomnia: Secondary | ICD-10-CM

## 2019-09-17 ENCOUNTER — Other Ambulatory Visit: Payer: Self-pay | Admitting: Family Medicine

## 2019-09-17 DIAGNOSIS — G2581 Restless legs syndrome: Secondary | ICD-10-CM

## 2019-10-23 ENCOUNTER — Other Ambulatory Visit: Payer: Self-pay | Admitting: Family Medicine

## 2019-10-23 DIAGNOSIS — M4716 Other spondylosis with myelopathy, lumbar region: Secondary | ICD-10-CM

## 2019-12-14 ENCOUNTER — Other Ambulatory Visit: Payer: Self-pay | Admitting: *Deleted

## 2019-12-14 DIAGNOSIS — G4709 Other insomnia: Secondary | ICD-10-CM

## 2019-12-14 MED ORDER — TRAZODONE HCL 150 MG PO TABS: 300.0000 mg | ORAL_TABLET | Freq: Every day | ORAL | 1 refills | Status: DC

## 2020-02-02 ENCOUNTER — Other Ambulatory Visit: Payer: Self-pay

## 2020-02-02 ENCOUNTER — Ambulatory Visit (INDEPENDENT_AMBULATORY_CARE_PROVIDER_SITE_OTHER): Payer: Medicare Other | Admitting: Family Medicine

## 2020-02-02 VITALS — BP 119/60 | HR 96 | Wt 220.2 lb

## 2020-02-02 DIAGNOSIS — M25559 Pain in unspecified hip: Secondary | ICD-10-CM

## 2020-02-02 NOTE — Progress Notes (Addendum)
    SUBJECTIVE:   CHIEF COMPLAINT / HPI:   Right groin pain Patient reports right groin pain x1 month.  Reports it is a burning sensation.  It started at his hip and then can go down into his groin.  Denies any rashes in the area.  Is sexually active.  Denies any penile discharge.  Denies any urinary symptoms.  Does have a history of kidney stones but states this does not feel like this.  Has been using Aspercreme which helps a little.  Bending makes the pain worse.  Nothing makes it better.  PERTINENT  PMH / PSH: HTN, lymbar spondylosis, CKD, tobacco use  OBJECTIVE:   BP 119/60   Pulse 96   Wt 220 lb 3.2 oz (99.9 kg)   SpO2 96%   BMI 29.86 kg/m   Hip:  - Inspection: No gross deformity, no swelling, erythema, or ecchymosis - Palpation: TTP over ASIS, specifically none over greater trochanter - ROM: Normal range of motion extension, abduction, internal and external rotation. Pain with flexion - Strength: Normal strength. - Neuro/vasc: NV intact distally - Special Tests: Negative FABER and FADIR.    - Negative for hernia. Chaperone present   ASSESSMENT/PLAN:   Hip pain Pain and physical exam pointing towards hip osteoarthritis.  Discussed this with patient.  We will plan to get imaging at this time.  Patient advised to use over-the-counter Tylenol as needed.  Advised to use heating pads.  Advised to use over-the-counter Voltaren gel as well.  I advised patient that if pain worsens he may need to be evaluated by sports medicine for ultrasound or possible ultrasound-guided steroid injections.  Follow-up in 1 month if no improvement.     Caroline More, [place]

## 2020-02-02 NOTE — Patient Instructions (Signed)
It was a pleasure seeing you today.   Today we discussed your hip pain  For your hip pain: use heating pads daily. Use tylenol as needed. You can use over the counter voltaren gel as needed. If the pain is severe we can send you to sports medicine for an injection.   Please follow up in 1 month or sooner if symptoms persist or worsen. Please call the clinic immediately if you have any concerns.   Our clinic's number is [phone]. Please call with questions or concerns.   Thank you,  Caroline More, DO

## 2020-02-03 ENCOUNTER — Other Ambulatory Visit: Payer: Self-pay | Admitting: Family Medicine

## 2020-02-03 DIAGNOSIS — N529 Male erectile dysfunction, unspecified: Secondary | ICD-10-CM

## 2020-02-03 DIAGNOSIS — M25559 Pain in unspecified hip: Secondary | ICD-10-CM | POA: Insufficient documentation

## 2020-02-03 MED ORDER — SILDENAFIL CITRATE 20 MG PO TABS: ORAL_TABLET | ORAL | 2 refills | Status: DC

## 2020-02-03 NOTE — Assessment & Plan Note (Signed)
Pain and physical exam pointing towards hip osteoarthritis.  Discussed this with patient.  We will plan to get imaging at this time.  Patient advised to use over-the-counter Tylenol as needed.  Advised to use heating pads.  Advised to use over-the-counter Voltaren gel as well.  I advised patient that if pain worsens he may need to be evaluated by sports medicine for ultrasound or possible ultrasound-guided steroid injections.  Follow-up in 1 month if no improvement.

## 2020-04-20 ENCOUNTER — Other Ambulatory Visit: Payer: Self-pay | Admitting: Family Medicine

## 2020-04-20 DIAGNOSIS — I10 Essential (primary) hypertension: Secondary | ICD-10-CM

## 2020-05-12 ENCOUNTER — Other Ambulatory Visit: Payer: Self-pay | Admitting: Family Medicine

## 2020-05-12 DIAGNOSIS — N529 Male erectile dysfunction, unspecified: Secondary | ICD-10-CM

## 2020-05-17 ENCOUNTER — Other Ambulatory Visit: Payer: Self-pay | Admitting: Family Medicine

## 2020-05-17 DIAGNOSIS — G4709 Other insomnia: Secondary | ICD-10-CM

## 2020-06-30 ENCOUNTER — Other Ambulatory Visit: Payer: Self-pay | Admitting: Family Medicine

## 2020-06-30 DIAGNOSIS — M4716 Other spondylosis with myelopathy, lumbar region: Secondary | ICD-10-CM

## 2020-07-02 ENCOUNTER — Other Ambulatory Visit: Payer: Self-pay | Admitting: Family Medicine

## 2020-07-02 DIAGNOSIS — K219 Gastro-esophageal reflux disease without esophagitis: Secondary | ICD-10-CM

## 2020-09-01 ENCOUNTER — Other Ambulatory Visit: Payer: Self-pay | Admitting: Family Medicine

## 2020-09-01 DIAGNOSIS — G2581 Restless legs syndrome: Secondary | ICD-10-CM

## 2020-09-08 ENCOUNTER — Other Ambulatory Visit: Payer: Self-pay

## 2020-09-08 DIAGNOSIS — N529 Male erectile dysfunction, unspecified: Secondary | ICD-10-CM

## 2020-09-08 MED ORDER — SILDENAFIL CITRATE 20 MG PO TABS: ORAL_TABLET | ORAL | 2 refills | Status: DC

## 2020-10-04 ENCOUNTER — Other Ambulatory Visit: Payer: Self-pay | Admitting: Family Medicine

## 2020-10-04 DIAGNOSIS — J301 Allergic rhinitis due to pollen: Secondary | ICD-10-CM

## 2020-11-10 ENCOUNTER — Other Ambulatory Visit: Payer: Self-pay | Admitting: Family Medicine

## 2020-11-10 DIAGNOSIS — G4709 Other insomnia: Secondary | ICD-10-CM

## 2021-01-04 ENCOUNTER — Other Ambulatory Visit: Payer: Self-pay | Admitting: Family Medicine

## 2021-01-04 DIAGNOSIS — I10 Essential (primary) hypertension: Secondary | ICD-10-CM

## 2021-01-04 NOTE — Telephone Encounter (Signed)
Please have patient schedule appointment for management of chronic conditions (blood pressure) at earliest convenience. It has been >1 year since these have been evaluated. NO further refills can be provided until seen.

## 2021-01-06 NOTE — Telephone Encounter (Signed)
Called and scheduled patient for appointment in April.

## 2021-02-10 ENCOUNTER — Ambulatory Visit (INDEPENDENT_AMBULATORY_CARE_PROVIDER_SITE_OTHER): Payer: Medicare Other | Admitting: Family Medicine

## 2021-02-10 ENCOUNTER — Other Ambulatory Visit: Payer: Self-pay

## 2021-02-10 ENCOUNTER — Encounter: Payer: Self-pay | Admitting: Family Medicine

## 2021-02-10 VITALS — BP 137/65 | HR 75 | Wt 224.2 lb

## 2021-02-10 DIAGNOSIS — Z131 Encounter for screening for diabetes mellitus: Secondary | ICD-10-CM | POA: Diagnosis not present

## 2021-02-10 DIAGNOSIS — E782 Mixed hyperlipidemia: Secondary | ICD-10-CM

## 2021-02-10 DIAGNOSIS — F17200 Nicotine dependence, unspecified, uncomplicated: Secondary | ICD-10-CM | POA: Diagnosis not present

## 2021-02-10 DIAGNOSIS — D7589 Other specified diseases of blood and blood-forming organs: Secondary | ICD-10-CM

## 2021-02-10 DIAGNOSIS — N1831 Chronic kidney disease, stage 3a: Secondary | ICD-10-CM

## 2021-02-10 DIAGNOSIS — J449 Chronic obstructive pulmonary disease, unspecified: Secondary | ICD-10-CM

## 2021-02-10 DIAGNOSIS — I10 Essential (primary) hypertension: Secondary | ICD-10-CM | POA: Diagnosis not present

## 2021-02-10 LAB — POCT GLYCOSYLATED HEMOGLOBIN (HGB A1C): HbA1c, POC (controlled diabetic range): 5.5 % (ref 0.0–7.0)

## 2021-02-10 MED ORDER — EZETIMIBE 10 MG PO TABS: 10.0000 mg | ORAL_TABLET | Freq: Every day | ORAL | 2 refills | Status: DC

## 2021-02-10 NOTE — Assessment & Plan Note (Signed)
Smokes 1 pack/day x 50 years (50 pack year history). Not interested in quitting. Declines nicotine replacement. Does qualify for lung cancer screen at this time but declines.  Patient was counseled on the risks of tobacco use and cessation strongly encouraged.

## 2021-02-10 NOTE — Assessment & Plan Note (Signed)
Chronic.  Normotensive today. -Continue lisinopril 10 mg daily -BMP today to monitor kidney function and electrolytes

## 2021-02-10 NOTE — Patient Instructions (Signed)
It was a pleasure to see you today!  Thank you for choosing [facility] for your primary care.   Our plans for today were:  Continue your Lisinopril 10mg  daily  I strongly encourage you to cut back on your cigarette smoking. Please let me know if there is anything I can do to assist you if/when you become interested  Please start Zetia (Ezetimibe) 10mg  daily for cholesterol. Follow up in 3-6 months to see how you are doing.  To keep you healthy, please keep in mind the following health maintenance items that you are due for:   1. Lung cancer screening 2.  Colon cancer screening 3. COVID vaccine   We are checking some labs today, I will call you if they are abnormal will send you a MyChart message or a letter if they are normal.  If you do not hear about your labs in the next 2 weeks please let us know.  BRING ALL OF YOUR MEDICATIONS WITH YOU TO EVERY VISIT  Best Wishes,   Mina Marble, DO

## 2021-02-10 NOTE — Assessment & Plan Note (Signed)
BMP today to monitor. Chronically takes Mobic for aches and pains. If creatinine continues to rise will need to discuss discontinuation of Mobic.  Briefly discussed this today during exam.

## 2021-02-10 NOTE — Assessment & Plan Note (Signed)
Prior CBC notable for macrocytosis without anemia. Has never had B12 testing. Will attempt to add on to today's labs. If unable, recommend evaluating at follow up

## 2021-02-10 NOTE — Assessment & Plan Note (Signed)
Chronic.  Intolerant to statins, fibroids, and omega-3 due to GI upset and patient is unwilling to restart. Trial Zetia 10 mg QD Recommended follow-up lipid panel in 3 to 6 months to evaluate for improvement

## 2021-02-10 NOTE — Progress Notes (Signed)
Subjective:   Patient ID: Todd Hayes    DOB: [birth year], 65 y.o. male   MRN: [id number]  Todd Hayes is a 65 y.o. male with a history of HTN, COPD, GERD, lumbar spondylosis, CKD III, tobacco use disorder, insomnia, HLD, ED, chronic pain here for blood pressure follow up  HTN:  BP: 137/65 today. Currently on Lisinopril 10mg  QD. Endorses compliance. Current everyday smoker - 1PPD. Denies any chest pain, SOB, vision changes, or headaches.   Lab Results  Component Value Date   CREATININE 1.58 (H) 06/09/2019   CREATININE 1.41 (H) 04/01/2018   CREATININE 1.25 05/24/2015    HLD: Last lipid panel below. Prescribed Omega 3 2g BID (Lovaza) but stopped taking because hit hurt his stomach. History of intolerance to multiple statins in past. GI upset with Fenofibrate. Endorses compliance. Denies any muscles aches or weakness.   Lab Results  Component Value Date   CHOL 234 (H) 06/09/2019   HDL 45 06/09/2019   LDLCALC Comment 06/09/2019   LDLDIRECT 136 (H) 04/01/2018   TRIG 449 (H) 06/09/2019   CHOLHDL 5.2 (H) 06/09/2019   Tobacco use: Smokes 1 pack/day x 50 years (50 pack year history). Not interested in quitting. Declines nicotine replacement.  Review of Systems:  Per HPI.   Objective:   BP 137/65   Pulse 75   Wt 224 lb 3.2 oz (101.7 kg)   SpO2 97%   BMI 30.41 kg/m  Vitals and nursing note reviewed.  General: pleasant older male, sitting comfortably in exam chair, well nourished, well developed, in no acute distress with non-toxic appearance CV: regular rate and rhythm without murmurs, rubs, or gallops Lungs: Scattered wheezes with normal work of breathing on room air, speaking in full sentences Skin: warm, dry, sun exposed skin has evidence of sun exposure  Extremities: warm and well perfused MSK: gait normal Neuro: Alert and oriented, speech normal  Assessment & Plan:   Primary hypertension Chronic.  Normotensive today. -Continue lisinopril 10 mg daily -BMP today  to monitor kidney function and electrolytes  COPD (chronic obstructive pulmonary disease) (HCC) Chronic.  Noncompliant with COPD inhalers.  Scattered wheezes on exam however breathing comfortably on room air and speaking in full sentences.  Recommended follow-up if develop shortness of breath or cough.  Encouraged Covid vaccination the patient declined.  Encourage smoking cessation.  CKD (chronic kidney disease) stage 3, GFR 30-59 ml/min (HCC) BMP today to monitor. Chronically takes Mobic for aches and pains. If creatinine continues to rise will need to discuss discontinuation of Mobic.  Briefly discussed this today during exam.  Tobacco use disorder Smokes 1 pack/day x 50 years (50 pack year history). Not interested in quitting. Declines nicotine replacement. Does qualify for lung cancer screen at this time but declines.  Patient was counseled on the risks of tobacco use and cessation strongly encouraged.   Hyperlipidemia Chronic.  Intolerant to statins, fibroids, and omega-3 due to GI upset and patient is unwilling to restart. Trial Zetia 10 mg QD Recommended follow-up lipid panel in 3 to 6 months to evaluate for improvement  Macrocytosis without anemia Prior CBC notable for macrocytosis without anemia. Has never had B12 testing. Will attempt to add on to today's labs. If unable, recommend evaluating at follow up  Health Maintenance: - declined COVID - declined colon cancer screen  Orders Placed This Encounter  Procedures  . Lipid Panel  . Basic Metabolic Panel  . Vitamin B12  . POCT glycosylated hemoglobin (Hb A1C)   Meds  ordered this encounter  Medications  . ezetimibe (ZETIA) 10 MG tablet    Sig: Take 1 tablet (10 mg total) by mouth daily.    Dispense:  30 tablet    Refill:  2     Mina Marble, DO PGY-3, [facility] 02/10/2021 5:33 PM

## 2021-02-10 NOTE — Assessment & Plan Note (Addendum)
Chronic.  Noncompliant with COPD inhalers.  Scattered wheezes on exam however breathing comfortably on room air and speaking in full sentences.  Recommended follow-up if develop shortness of breath or cough.  Encouraged Covid vaccination the patient declined.  Encourage smoking cessation.

## 2021-02-11 LAB — BASIC METABOLIC PANEL
BUN/Creatinine Ratio: 15 (ref 10–24)
BUN: 22 mg/dL (ref 8–27)
CO2: 24 mmol/L (ref 20–29)
Calcium: 9.4 mg/dL (ref 8.6–10.2)
Chloride: 98 mmol/L (ref 96–106)
Creatinine, Ser: 1.43 mg/dL — ABNORMAL HIGH (ref 0.76–1.27)
Glucose: 93 mg/dL (ref 65–99)
Potassium: 4.9 mmol/L (ref 3.5–5.2)
Sodium: 137 mmol/L (ref 134–144)
eGFR: 55 mL/min/{1.73_m2} — ABNORMAL LOW (ref >59)

## 2021-02-11 LAB — LIPID PANEL
Chol/HDL Ratio: 5.5 ratio — ABNORMAL HIGH (ref 0.0–5.0)
Cholesterol, Total: 253 mg/dL — ABNORMAL HIGH (ref 100–199)
HDL: 46 mg/dL (ref >39)
LDL Chol Calc (NIH): 146 mg/dL — ABNORMAL HIGH (ref 0–99)
Triglycerides: 331 mg/dL — ABNORMAL HIGH (ref 0–149)
VLDL Cholesterol Cal: 61 mg/dL — ABNORMAL HIGH (ref 5–40)

## 2021-02-14 LAB — VITAMIN B12: Vitamin B-12: 328 pg/mL (ref 232–1245)

## 2021-02-14 LAB — SPECIMEN STATUS REPORT

## 2021-02-20 NOTE — Addendum Note (Signed)
Addended by: [place] Pink on: 02/20/2021 09:22 AM   Modules accepted: Orders

## 2021-02-26 ENCOUNTER — Other Ambulatory Visit: Payer: Self-pay | Admitting: Family Medicine

## 2021-02-26 DIAGNOSIS — I10 Essential (primary) hypertension: Secondary | ICD-10-CM

## 2021-02-27 ENCOUNTER — Encounter: Payer: Self-pay | Admitting: Family Medicine

## 2021-03-27 ENCOUNTER — Other Ambulatory Visit: Payer: Self-pay | Admitting: Family Medicine

## 2021-03-27 DIAGNOSIS — M4716 Other spondylosis with myelopathy, lumbar region: Secondary | ICD-10-CM

## 2021-05-04 ENCOUNTER — Other Ambulatory Visit: Payer: Self-pay | Admitting: Family Medicine

## 2021-05-04 DIAGNOSIS — G4709 Other insomnia: Secondary | ICD-10-CM

## 2021-05-11 ENCOUNTER — Other Ambulatory Visit: Payer: Self-pay | Admitting: Family Medicine

## 2021-05-11 DIAGNOSIS — E782 Mixed hyperlipidemia: Secondary | ICD-10-CM

## 2021-06-27 ENCOUNTER — Other Ambulatory Visit: Payer: Self-pay

## 2021-06-27 DIAGNOSIS — K219 Gastro-esophageal reflux disease without esophagitis: Secondary | ICD-10-CM

## 2021-06-29 MED ORDER — PANTOPRAZOLE SODIUM 20 MG PO TBEC
20.0000 mg | DELAYED_RELEASE_TABLET | Freq: Every day | ORAL | 3 refills | Status: DC
Start: 2021-06-29 — End: 2022-06-27

## 2021-08-26 ENCOUNTER — Other Ambulatory Visit: Payer: Self-pay | Admitting: Family Medicine

## 2021-08-26 DIAGNOSIS — G2581 Restless legs syndrome: Secondary | ICD-10-CM

## 2021-09-26 ENCOUNTER — Other Ambulatory Visit: Payer: Self-pay

## 2021-09-26 DIAGNOSIS — G894 Chronic pain syndrome: Secondary | ICD-10-CM

## 2021-09-26 DIAGNOSIS — M4716 Other spondylosis with myelopathy, lumbar region: Secondary | ICD-10-CM

## 2021-09-29 MED ORDER — BACLOFEN 10 MG PO TABS: ORAL_TABLET | ORAL | 0 refills | Status: DC

## 2021-09-29 NOTE — Addendum Note (Signed)
Addended by: Lyndee Hensen D on: 09/29/2021 03:28 PM   Modules accepted: Orders

## 2021-09-29 NOTE — Telephone Encounter (Signed)
Patient's fiance calls nurse line regarding refill request being denied. Reports that they have scheduled for first available appointment with Dr. Susa Simmonds on 12/1.  Please advise if patient can receive two week refill to last until scheduled appointment.   Talbot Grumbling, RN

## 2021-10-06 ENCOUNTER — Other Ambulatory Visit: Payer: Self-pay | Admitting: Family Medicine

## 2021-10-06 DIAGNOSIS — M4716 Other spondylosis with myelopathy, lumbar region: Secondary | ICD-10-CM

## 2021-10-06 DIAGNOSIS — G894 Chronic pain syndrome: Secondary | ICD-10-CM

## 2021-10-12 ENCOUNTER — Other Ambulatory Visit: Payer: Self-pay

## 2021-10-12 ENCOUNTER — Ambulatory Visit (INDEPENDENT_AMBULATORY_CARE_PROVIDER_SITE_OTHER): Payer: Medicare Other | Admitting: Family Medicine

## 2021-10-12 ENCOUNTER — Encounter: Payer: Self-pay | Admitting: Family Medicine

## 2021-10-12 DIAGNOSIS — J449 Chronic obstructive pulmonary disease, unspecified: Secondary | ICD-10-CM | POA: Diagnosis not present

## 2021-10-12 DIAGNOSIS — G4709 Other insomnia: Secondary | ICD-10-CM | POA: Diagnosis not present

## 2021-10-12 DIAGNOSIS — G894 Chronic pain syndrome: Secondary | ICD-10-CM | POA: Diagnosis not present

## 2021-10-12 DIAGNOSIS — M4716 Other spondylosis with myelopathy, lumbar region: Secondary | ICD-10-CM

## 2021-10-12 DIAGNOSIS — F17200 Nicotine dependence, unspecified, uncomplicated: Secondary | ICD-10-CM

## 2021-10-12 MED ORDER — DOXYCYCLINE HYCLATE 100 MG PO TABS: 100.0000 mg | ORAL_TABLET | Freq: Two times a day (BID) | ORAL | 0 refills | Status: DC

## 2021-10-12 MED ORDER — BACLOFEN 10 MG PO TABS: 10.0000 mg | ORAL_TABLET | Freq: Two times a day (BID) | ORAL | 1 refills | Status: DC

## 2021-10-12 MED ORDER — PREDNISONE 20 MG PO TABS: 40.0000 mg | ORAL_TABLET | Freq: Every day | ORAL | 0 refills | Status: AC

## 2021-10-12 MED ORDER — TRAZODONE HCL 150 MG PO TABS
300.0000 mg | ORAL_TABLET | Freq: Every day | ORAL | 1 refills | Status: DC
Start: 2021-10-12 — End: 2022-03-19

## 2021-10-12 NOTE — Patient Instructions (Signed)
Stop by the pharmacy to pick up your prescriptions.  For your COPD/asthma take steroids and antibiotics for the next 5 days.   Watch for worsening symptoms such as an increasing weakness or loss of sensation legs, increasing pain and the loss of bladder or bowel function. Should any of these occur, go to the emergency department immediately.   Take Care,  Dr Susa Simmonds

## 2021-10-12 NOTE — Assessment & Plan Note (Addendum)
Pt is a 65 yo male who has smoked 1 ppd for the past 50+ years. Refuses controller inhalers or low dose CT at this time as they "wont" change anything.  Has acute exacerbation. Treat with prednisone and doxycycline for 5 days

## 2021-10-17 NOTE — Assessment & Plan Note (Addendum)
Acute on chronic. MRI from 2010 showed small disc protrusion at L1/L2, L3/4 with nerve contact at L3 and narrowing at L4/5. Previously was on chronic opioids that have since been discontinued. Refilled Baclofen today. Continue OTC analgesics.

## 2021-10-17 NOTE — Assessment & Plan Note (Signed)
Smoking cessation instruction/counseling given:  counseled patient on the dangers of tobacco use, advised patient to stop smoking, and reviewed strategies to maximize success.  Pt not ready to quit at this time.

## 2021-10-17 NOTE — Assessment & Plan Note (Signed)
Refilled trazodone at pt's request.

## 2021-10-17 NOTE — Progress Notes (Signed)
   SUBJECTIVE:   CHIEF COMPLAINT / HPI:   Chief Complaint  Patient presents with   Medication Refill     Todd Hayes is a 65 y.o. male here for to discuss low back pain. Patient acutely worse as he ran out of his muscle relaxer. Feels like a sharp achy pain in his back. 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  Leans to the side for relief.     PERTINENT  PMH / PSH: reviewed and updated as appropriate   OBJECTIVE:   BP (!) 155/70   Pulse 72   Ht 6' (1.829 m)   Wt 226 lb 6.4 oz (102.7 kg)   SpO2 99%   BMI 30.71 kg/m    GEN: older male, in no acute distress  CV: regular rate and rhythm RESP:  mild expiratory wheezing and rhonchi  MSK: Lumbar spine: - Inspection: no gross deformity, swelling or ecchymosis. No skin changes - Palpation: No TTP over the spinous processes or SI joints b/l, hypertonicity of lumbar paraspinal muscles  - ROM: limited ROM of the lumbar spine in flexion and extension 2/2 pain, pain with extension - Strength: 5/5 strength of lower extremity in L4-S1 nerve root distributions b/l - Neuro: sensation intact in the L4-S1 nerve root distribution b/l, 2+ L4 and S1 reflexes    ASSESSMENT/PLAN:   COPD (chronic obstructive pulmonary disease) ([place]) Pt is a 65 yo male who has smoked 1 ppd for the past 50+ years. Refuses controller inhalers or low dose CT at this time as they "wont" change anything.  Has acute exacerbation. Treat with prednisone and doxycycline for 5 days   Tobacco use disorder Smoking cessation instruction/counseling given:  counseled patient on the dangers of tobacco use, advised patient to stop smoking, and reviewed strategies to maximize success.  Pt not ready to quit at this time.    Lumbar spondylosis with myelopathy Acute on chronic. MRI from 2010 showed small disc protrusion at L1/L2, L3/4 with nerve contact at L3 and narrowing at L4/5. Previously was on chronic opioids that have since been discontinued. Refilled Baclofen today. Continue OTC  analgesics.   Insomnia Refilled trazodone at pt's request.     Lyndee Hensen, DO PGY-3, [facility] 10/17/2021

## 2021-10-27 ENCOUNTER — Other Ambulatory Visit: Payer: Self-pay | Admitting: Family Medicine

## 2021-10-27 DIAGNOSIS — J301 Allergic rhinitis due to pollen: Secondary | ICD-10-CM

## 2021-11-14 ENCOUNTER — Other Ambulatory Visit: Payer: Self-pay

## 2021-11-14 DIAGNOSIS — I10 Essential (primary) hypertension: Secondary | ICD-10-CM

## 2021-11-14 MED ORDER — LISINOPRIL 10 MG PO TABS: 10.0000 mg | ORAL_TABLET | Freq: Every day | ORAL | 0 refills | Status: DC

## 2021-12-21 ENCOUNTER — Other Ambulatory Visit: Payer: Self-pay | Admitting: Family Medicine

## 2021-12-21 DIAGNOSIS — I10 Essential (primary) hypertension: Secondary | ICD-10-CM

## 2021-12-21 DIAGNOSIS — M4716 Other spondylosis with myelopathy, lumbar region: Secondary | ICD-10-CM

## 2022-01-31 ENCOUNTER — Telehealth: Payer: Self-pay | Admitting: Family Medicine

## 2022-01-31 NOTE — Telephone Encounter (Signed)
Patient dropped of DMV placard form to be completed. Last DOS was 10/12/21. Placed in [employer]. ?

## 2022-02-01 NOTE — Telephone Encounter (Signed)
Clinical info completed on Handicap  form.  Given to  PCP while in clinic today for completion.  Salvatore Marvel, CMA  ?

## 2022-02-05 NOTE — Telephone Encounter (Signed)
Form placed up front for pick up and a copy made for batch scanning.  ? ?Attempted to contact patient to inform, however no answer or option for VM.  ? ?If he calls back, please let him know his placard form is ready up front.  ?

## 2022-02-09 ENCOUNTER — Other Ambulatory Visit: Payer: Self-pay | Admitting: Family Medicine

## 2022-02-09 DIAGNOSIS — I10 Essential (primary) hypertension: Secondary | ICD-10-CM

## 2022-03-07 ENCOUNTER — Other Ambulatory Visit: Payer: Self-pay | Admitting: Family Medicine

## 2022-03-07 DIAGNOSIS — M4716 Other spondylosis with myelopathy, lumbar region: Secondary | ICD-10-CM

## 2022-03-07 DIAGNOSIS — G894 Chronic pain syndrome: Secondary | ICD-10-CM

## 2022-03-19 ENCOUNTER — Other Ambulatory Visit: Payer: Self-pay | Admitting: Family Medicine

## 2022-03-19 DIAGNOSIS — I10 Essential (primary) hypertension: Secondary | ICD-10-CM

## 2022-03-19 DIAGNOSIS — G4709 Other insomnia: Secondary | ICD-10-CM

## 2022-03-19 DIAGNOSIS — M4716 Other spondylosis with myelopathy, lumbar region: Secondary | ICD-10-CM

## 2022-05-25 ENCOUNTER — Other Ambulatory Visit: Payer: Self-pay | Admitting: Family Medicine

## 2022-05-25 DIAGNOSIS — I10 Essential (primary) hypertension: Secondary | ICD-10-CM

## 2022-05-29 DIAGNOSIS — J441 Chronic obstructive pulmonary disease with (acute) exacerbation: Secondary | ICD-10-CM | POA: Diagnosis not present

## 2022-05-29 DIAGNOSIS — R051 Acute cough: Secondary | ICD-10-CM | POA: Diagnosis not present

## 2022-05-29 DIAGNOSIS — R509 Fever, unspecified: Secondary | ICD-10-CM | POA: Diagnosis not present

## 2022-06-07 DIAGNOSIS — K121 Other forms of stomatitis: Secondary | ICD-10-CM | POA: Diagnosis not present

## 2022-06-16 ENCOUNTER — Other Ambulatory Visit: Payer: Self-pay | Admitting: Family Medicine

## 2022-06-16 DIAGNOSIS — G2581 Restless legs syndrome: Secondary | ICD-10-CM

## 2022-06-22 ENCOUNTER — Other Ambulatory Visit: Payer: Self-pay | Admitting: Family Medicine

## 2022-06-22 DIAGNOSIS — G4709 Other insomnia: Secondary | ICD-10-CM

## 2022-06-22 DIAGNOSIS — M4716 Other spondylosis with myelopathy, lumbar region: Secondary | ICD-10-CM

## 2022-06-27 ENCOUNTER — Other Ambulatory Visit: Payer: Self-pay | Admitting: Family Medicine

## 2022-06-27 DIAGNOSIS — G4709 Other insomnia: Secondary | ICD-10-CM

## 2022-06-27 DIAGNOSIS — M4716 Other spondylosis with myelopathy, lumbar region: Secondary | ICD-10-CM

## 2022-06-27 DIAGNOSIS — K219 Gastro-esophageal reflux disease without esophagitis: Secondary | ICD-10-CM

## 2022-06-27 NOTE — Telephone Encounter (Signed)
Patient calls nurse line regarding medication refills.   Scheduled patient for follow up on Monday, 9/11.   Forwarding request to PCP.   Talbot Grumbling, RN

## 2022-06-28 MED ORDER — MELOXICAM 7.5 MG PO TABS: 7.5000 mg | ORAL_TABLET | Freq: Every day | ORAL | 0 refills | Status: DC

## 2022-06-28 MED ORDER — TRAZODONE HCL 150 MG PO TABS: 300.0000 mg | ORAL_TABLET | Freq: Every day | ORAL | 0 refills | Status: DC

## 2022-06-28 MED ORDER — PANTOPRAZOLE SODIUM 20 MG PO TBEC: 20.0000 mg | DELAYED_RELEASE_TABLET | Freq: Every day | ORAL | 3 refills | Status: DC

## 2022-07-15 ENCOUNTER — Other Ambulatory Visit: Payer: Self-pay | Admitting: Student

## 2022-07-15 DIAGNOSIS — G4709 Other insomnia: Secondary | ICD-10-CM

## 2022-07-19 ENCOUNTER — Other Ambulatory Visit: Payer: Self-pay | Admitting: Student

## 2022-07-19 DIAGNOSIS — M4716 Other spondylosis with myelopathy, lumbar region: Secondary | ICD-10-CM

## 2022-07-19 DIAGNOSIS — G4709 Other insomnia: Secondary | ICD-10-CM

## 2022-07-20 ENCOUNTER — Other Ambulatory Visit: Payer: Self-pay | Admitting: Student

## 2022-07-20 DIAGNOSIS — G4709 Other insomnia: Secondary | ICD-10-CM

## 2022-07-23 ENCOUNTER — Other Ambulatory Visit: Payer: Self-pay | Admitting: Family Medicine

## 2022-07-23 ENCOUNTER — Encounter: Payer: Self-pay | Admitting: Student

## 2022-07-23 ENCOUNTER — Ambulatory Visit (INDEPENDENT_AMBULATORY_CARE_PROVIDER_SITE_OTHER): Payer: Medicare Other | Admitting: Student

## 2022-07-23 VITALS — BP 138/70 | HR 104 | Temp 97.9°F | Ht 72.0 in | Wt 222.4 lb

## 2022-07-23 DIAGNOSIS — E782 Mixed hyperlipidemia: Secondary | ICD-10-CM | POA: Diagnosis not present

## 2022-07-23 DIAGNOSIS — G4709 Other insomnia: Secondary | ICD-10-CM | POA: Diagnosis not present

## 2022-07-23 DIAGNOSIS — I10 Essential (primary) hypertension: Secondary | ICD-10-CM | POA: Diagnosis not present

## 2022-07-23 DIAGNOSIS — F17200 Nicotine dependence, unspecified, uncomplicated: Secondary | ICD-10-CM | POA: Diagnosis not present

## 2022-07-23 DIAGNOSIS — M4716 Other spondylosis with myelopathy, lumbar region: Secondary | ICD-10-CM

## 2022-07-23 DIAGNOSIS — G894 Chronic pain syndrome: Secondary | ICD-10-CM

## 2022-07-23 DIAGNOSIS — J449 Chronic obstructive pulmonary disease, unspecified: Secondary | ICD-10-CM

## 2022-07-23 MED ORDER — TRAZODONE HCL 150 MG PO TABS: 300.0000 mg | ORAL_TABLET | Freq: Every day | ORAL | 2 refills | Status: DC

## 2022-07-23 MED ORDER — MELOXICAM 7.5 MG PO TABS: 7.5000 mg | ORAL_TABLET | Freq: Every day | ORAL | 0 refills | Status: DC

## 2022-07-23 MED ORDER — BACLOFEN 10 MG PO TABS: ORAL_TABLET | ORAL | 1 refills | Status: DC

## 2022-07-23 NOTE — Assessment & Plan Note (Signed)
Has had COPD exacerbations in the past, smokes 1 PPD x 50 yrs. He would not like to quit smoking and reports that he would not like a low dose CT scan at this time.    Does qualify for lung cancer screen at this time. Patient was counseled on the risks of tobacco use and cessation strongly encouraged.  Recommendations: Annual lung cancer screening with low-dose CT to adults aged 65 to 2 years, with a 20 pack-year smoking history, and who currently smoke or have quit within the last 15 years (B recommendation). Screening should be discontinued once a person has not smoked for 15 years, develops a substantially limited life-expectancy, or no longer desires to continue screening.

## 2022-07-23 NOTE — Patient Instructions (Addendum)
It was great to see you today! Thank you for choosing [facility] for your primary care. Todd Hayes was seen for follow up and medication refills.  Today we addressed: Labs that we are getting today  I have provided your refills today for baclofen, trazodone, and mobic I will call with your results of your labs  Please consider the following vaccines and screenings given your age    You can get the Shingrix vaccine at the pharmacy. You will need a 2nd shot 2-6 months after the first. This vaccine is important to help prevent shingles.   Schedule your colonoscopy to help detect colon cancer. If you decide against this, please ask about the cologuard as an option.    Annual lung cancer screening with low-dose CT to adults aged 49 to 62 years, with a 20 pack-year smoking history, and who currently smoke or have quit within the last 15 years. Screening should be discontinued once a person has not smoked for 15 years, develops a substantially limited life-expectancy, or no longer desires to continue screening. If you meet this criteria and would like this done, let us know     We recommend aortic aneurysm screening for males from the age of 71-75 who have ever smoked.  If you meet these criteria, and would like to have this done, please let us know.     If you haven't already, sign up for My Chart to have easy access to your labs results, and communication with your primary care physician.  We are checking some labs today. If they are abnormal, I will call you. If they are normal, I will send you a MyChart message (if it is active) or a letter in the mail. If you do not hear about your labs in the next 2 weeks, please call the office. I recommend that you always bring your medications to each appointment as this makes it easy to ensure you are on the correct medications and helps [place] not miss refills when you need them. Call the clinic at [phone] if your symptoms worsen or you have  any concerns.  You should return to our clinic Return in about 3 months (around 10/22/2022) for f/u HTN, pain, COPD . Please arrive 15 minutes before your appointment to ensure smooth check in process.  We appreciate your efforts in making this happen.  Thank you for allowing me to participate in your care, Erskine Emery, MD 07/23/2022, 3:09 PM PGY-2, [facility]

## 2022-07-23 NOTE — Assessment & Plan Note (Signed)
See COPD Plan

## 2022-07-23 NOTE — Assessment & Plan Note (Signed)
today. controlled. Goal of <140/90. Continue to work on healthy dietary habits and exercise. Follow up in **.   Medication regimen: Lisinopril 10 mg daily

## 2022-07-23 NOTE — Progress Notes (Signed)
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    PERTINENT  PMH / PSH:   Past Medical History:  Diagnosis Date   Allergy    Chronic pain    GERD (gastroesophageal reflux disease)    Hyperlipidemia    Hypertension    Insomnia     OBJECTIVE:  BP 138/70   Pulse (!) 104   Temp 97.9 F (36.6 C)   Ht 6' (1.829 m)   Wt 222 lb 6.4 oz (100.9 kg)   SpO2 96%   BMI 30.16 kg/m   General: NAD, pleasant, able to participate in exam; pleasant, caucasian M  Cardiac: RRR, no murmurs auscultated Respiratory: Scattered expiratory wheeze with no focal diminishment, normal WOB Abdomen: soft, non-tender, non-distended, normoactive bowel sounds Extremities: warm and well perfused, no edema or cyanosis Back: NTTP over paraspinal muscles with 5/5 of the lower extremities, normal ambulation  Skin: warm and dry, no rashes noted Neuro: alert, no obvious focal deficits, speech normal Psych: Normal affect and mood  ASSESSMENT/PLAN:  Primary hypertension BP: 138/70 today. controlled. Goal of <140/90. Continue to work on healthy dietary habits and exercise. Follow up in a few months  Medication regimen: Lisinopril 10 mg daily    COPD (chronic obstructive pulmonary disease) ([place]) Has had COPD exacerbations in the past, smokes 1 PPD x 50 yrs. He would not like to quit smoking and reports that he would not like a low dose CT scan at this time.     Does qualify for lung cancer screen at this time. Patient was counseled on the risks of tobacco use and cessation strongly encouraged.  Recommendations: Annual lung cancer screening with low-dose CT to adults aged 61 to 12 years, with a 20 pack-year smoking history, and who currently smoke or have quit within the last 15 years (B recommendation). Screening should be discontinued once a person has not smoked for 15 years, develops a substantially limited life-expectancy, or no longer desires to continue screening.   Tobacco use disorder See COPD Plan   Chronic pain Refill Baclofen and Mobic. Check kidney function with BMP. Discussed low impact exercises.   Insomnia Trazodone for insomnia and seems beneficial for patient. Work on Publishing rights manager.    Orders Placed This Encounter  Procedures   Lipid panel   Basic Metabolic Panel   CBC   Meds ordered this encounter  Medications   baclofen (LIORESAL) 10 MG tablet    Sig: TAKE 1 TABLET BY MOUTH ONCE OR TWICE DAILY AS NEEDED FOR MUSCLE SPASMS    Dispense:  60 tablet    Refill:  1    DX Code Needed  RX IS OUT OF REFILLS.   meloxicam (MOBIC) 7.5 MG tablet    Sig: Take 1 tablet (7.5 mg total) by mouth daily.    Dispense:  30 tablet    Refill:  0   traZODone (DESYREL) 150 MG tablet  Sig: Take 2 tablets (300 mg total) by mouth at bedtime.    Dispense:  60 tablet    Refill:  2    PATIENT HAS VISIT SCHEDULED WITH ME 9/11 FOR ANOTHER FULL REFILL   Return in about 3 months (around 10/22/2022) for f/u HTN, pain, COPD . Erskine Emery, MD 07/25/2022, 7:28 AM PGY-2, [facility] [facility]

## 2022-07-24 LAB — LIPID PANEL
Chol/HDL Ratio: 5.8 ratio — ABNORMAL HIGH (ref 0.0–5.0)
Cholesterol, Total: 238 mg/dL — ABNORMAL HIGH (ref 100–199)
HDL: 41 mg/dL (ref >39)
LDL Chol Calc (NIH): 95 mg/dL (ref 0–99)
Triglycerides: 609 mg/dL (ref 0–149)
VLDL Cholesterol Cal: 102 mg/dL — ABNORMAL HIGH (ref 5–40)

## 2022-07-24 LAB — CBC
Hematocrit: 42.4 % (ref 37.5–51.0)
Hemoglobin: 14.8 g/dL (ref 13.0–17.7)
MCH: 35.7 pg — ABNORMAL HIGH (ref 26.6–33.0)
MCHC: 34.9 g/dL (ref 31.5–35.7)
MCV: 102 fL — ABNORMAL HIGH (ref 79–97)
Platelets: 124 10*3/uL — ABNORMAL LOW (ref 150–450)
RBC: 4.15 x10E6/uL (ref 4.14–5.80)
RDW: 13.2 % (ref 11.6–15.4)
WBC: 4.7 10*3/uL (ref 3.4–10.8)

## 2022-07-24 LAB — BASIC METABOLIC PANEL
BUN/Creatinine Ratio: 15 (ref 10–24)
BUN: 18 mg/dL (ref 8–27)
CO2: 22 mmol/L (ref 20–29)
Calcium: 9.1 mg/dL (ref 8.6–10.2)
Chloride: 99 mmol/L (ref 96–106)
Creatinine, Ser: 1.22 mg/dL (ref 0.76–1.27)
Glucose: 119 mg/dL — ABNORMAL HIGH (ref 70–99)
Potassium: 4.6 mmol/L (ref 3.5–5.2)
Sodium: 138 mmol/L (ref 134–144)
eGFR: 66 mL/min/{1.73_m2} (ref >59)

## 2022-07-25 ENCOUNTER — Encounter: Payer: Self-pay | Admitting: Student

## 2022-07-25 DIAGNOSIS — E782 Mixed hyperlipidemia: Secondary | ICD-10-CM

## 2022-07-25 NOTE — Assessment & Plan Note (Signed)
Trazodone for insomnia and seems beneficial for patient. Work on Publishing rights manager.

## 2022-07-25 NOTE — Assessment & Plan Note (Signed)
Refill Baclofen and Mobic. Check kidney function with BMP. Discussed low impact exercises.

## 2022-07-25 NOTE — Progress Notes (Signed)
Nonfasting triglycerides 609 LDL 95>recheck fasting  BMP nml  MCV elevated as previous   Recheck fasting at next available.   Sent letter outlining plan to check a fasting lipid panel and ordered future panel  Patient intolerant to statin and Zetia, will try to discuss what he would be willing to try if indicated at next visit.

## 2022-07-30 ENCOUNTER — Other Ambulatory Visit: Payer: Self-pay | Admitting: Student

## 2022-07-30 DIAGNOSIS — M4716 Other spondylosis with myelopathy, lumbar region: Secondary | ICD-10-CM

## 2022-08-15 ENCOUNTER — Other Ambulatory Visit: Payer: Self-pay | Admitting: Student

## 2022-08-15 DIAGNOSIS — M4716 Other spondylosis with myelopathy, lumbar region: Secondary | ICD-10-CM

## 2022-08-15 DIAGNOSIS — G4709 Other insomnia: Secondary | ICD-10-CM

## 2022-08-15 DIAGNOSIS — G894 Chronic pain syndrome: Secondary | ICD-10-CM

## 2022-08-16 ENCOUNTER — Other Ambulatory Visit: Payer: Self-pay | Admitting: Family Medicine

## 2022-08-16 DIAGNOSIS — M4716 Other spondylosis with myelopathy, lumbar region: Secondary | ICD-10-CM

## 2022-08-16 DIAGNOSIS — G894 Chronic pain syndrome: Secondary | ICD-10-CM

## 2022-08-16 NOTE — Telephone Encounter (Signed)
Do not take Meloxicam regularly, given NSAID risks

## 2022-08-17 ENCOUNTER — Other Ambulatory Visit: Payer: Self-pay | Admitting: Student

## 2022-08-17 DIAGNOSIS — I10 Essential (primary) hypertension: Secondary | ICD-10-CM

## 2022-09-09 ENCOUNTER — Other Ambulatory Visit: Payer: Self-pay | Admitting: Student

## 2022-09-09 DIAGNOSIS — G894 Chronic pain syndrome: Secondary | ICD-10-CM

## 2022-09-09 DIAGNOSIS — M4716 Other spondylosis with myelopathy, lumbar region: Secondary | ICD-10-CM

## 2022-09-14 ENCOUNTER — Other Ambulatory Visit: Payer: Self-pay | Admitting: Student

## 2022-09-14 DIAGNOSIS — M4716 Other spondylosis with myelopathy, lumbar region: Secondary | ICD-10-CM

## 2022-10-10 ENCOUNTER — Other Ambulatory Visit: Payer: Self-pay | Admitting: Student

## 2022-10-10 DIAGNOSIS — M4716 Other spondylosis with myelopathy, lumbar region: Secondary | ICD-10-CM

## 2022-10-10 DIAGNOSIS — G894 Chronic pain syndrome: Secondary | ICD-10-CM

## 2022-10-11 ENCOUNTER — Other Ambulatory Visit: Payer: Self-pay | Admitting: Family Medicine

## 2022-10-11 DIAGNOSIS — G894 Chronic pain syndrome: Secondary | ICD-10-CM

## 2022-10-11 DIAGNOSIS — M4716 Other spondylosis with myelopathy, lumbar region: Secondary | ICD-10-CM

## 2022-11-02 ENCOUNTER — Other Ambulatory Visit: Payer: Self-pay | Admitting: Student

## 2022-11-02 DIAGNOSIS — M4716 Other spondylosis with myelopathy, lumbar region: Secondary | ICD-10-CM

## 2022-11-02 DIAGNOSIS — G894 Chronic pain syndrome: Secondary | ICD-10-CM

## 2022-11-07 ENCOUNTER — Other Ambulatory Visit: Payer: Self-pay | Admitting: Family Medicine

## 2022-11-07 ENCOUNTER — Other Ambulatory Visit: Payer: Self-pay | Admitting: Student

## 2022-11-07 DIAGNOSIS — M4716 Other spondylosis with myelopathy, lumbar region: Secondary | ICD-10-CM

## 2022-11-07 DIAGNOSIS — J301 Allergic rhinitis due to pollen: Secondary | ICD-10-CM

## 2022-11-08 ENCOUNTER — Other Ambulatory Visit: Payer: Self-pay | Admitting: Student

## 2022-11-08 DIAGNOSIS — G4709 Other insomnia: Secondary | ICD-10-CM

## 2022-11-09 ENCOUNTER — Other Ambulatory Visit: Payer: Self-pay | Admitting: Student

## 2022-11-09 DIAGNOSIS — M4716 Other spondylosis with myelopathy, lumbar region: Secondary | ICD-10-CM

## 2022-11-11 ENCOUNTER — Other Ambulatory Visit: Payer: Self-pay | Admitting: Student

## 2022-11-11 DIAGNOSIS — I10 Essential (primary) hypertension: Secondary | ICD-10-CM

## 2022-12-01 ENCOUNTER — Other Ambulatory Visit: Payer: Self-pay | Admitting: Student

## 2022-12-01 DIAGNOSIS — G4709 Other insomnia: Secondary | ICD-10-CM

## 2022-12-10 ENCOUNTER — Other Ambulatory Visit: Payer: Self-pay | Admitting: Student

## 2022-12-10 DIAGNOSIS — M4716 Other spondylosis with myelopathy, lumbar region: Secondary | ICD-10-CM

## 2023-01-08 ENCOUNTER — Other Ambulatory Visit: Payer: Self-pay | Admitting: Student

## 2023-01-08 DIAGNOSIS — M4716 Other spondylosis with myelopathy, lumbar region: Secondary | ICD-10-CM

## 2023-01-08 NOTE — Telephone Encounter (Signed)
Patient understands risk of prolonged NSAID use

## 2023-02-03 DIAGNOSIS — R21 Rash and other nonspecific skin eruption: Secondary | ICD-10-CM | POA: Diagnosis not present

## 2023-02-10 ENCOUNTER — Other Ambulatory Visit: Payer: Self-pay | Admitting: Student

## 2023-02-10 DIAGNOSIS — I10 Essential (primary) hypertension: Secondary | ICD-10-CM

## 2023-03-09 ENCOUNTER — Other Ambulatory Visit: Payer: Self-pay | Admitting: Student

## 2023-03-09 DIAGNOSIS — M4716 Other spondylosis with myelopathy, lumbar region: Secondary | ICD-10-CM

## 2023-03-15 ENCOUNTER — Other Ambulatory Visit: Payer: Self-pay | Admitting: Student

## 2023-03-15 DIAGNOSIS — G894 Chronic pain syndrome: Secondary | ICD-10-CM

## 2023-03-15 DIAGNOSIS — M4716 Other spondylosis with myelopathy, lumbar region: Secondary | ICD-10-CM

## 2023-05-01 ENCOUNTER — Other Ambulatory Visit: Payer: Self-pay | Admitting: Student

## 2023-05-01 DIAGNOSIS — G4709 Other insomnia: Secondary | ICD-10-CM

## 2023-05-10 ENCOUNTER — Other Ambulatory Visit: Payer: Self-pay | Admitting: Student

## 2023-05-10 DIAGNOSIS — M4716 Other spondylosis with myelopathy, lumbar region: Secondary | ICD-10-CM

## 2023-05-10 DIAGNOSIS — G894 Chronic pain syndrome: Secondary | ICD-10-CM

## 2023-05-10 DIAGNOSIS — I10 Essential (primary) hypertension: Secondary | ICD-10-CM

## 2023-05-20 ENCOUNTER — Ambulatory Visit (INDEPENDENT_AMBULATORY_CARE_PROVIDER_SITE_OTHER): Payer: Medicare Other | Admitting: Student

## 2023-05-20 ENCOUNTER — Encounter: Payer: Self-pay | Admitting: Student

## 2023-05-20 ENCOUNTER — Other Ambulatory Visit: Payer: Self-pay

## 2023-05-20 VITALS — BP 101/61 | HR 80 | Ht 72.0 in | Wt 220.6 lb

## 2023-05-20 DIAGNOSIS — Z131 Encounter for screening for diabetes mellitus: Secondary | ICD-10-CM

## 2023-05-20 DIAGNOSIS — M25551 Pain in right hip: Secondary | ICD-10-CM | POA: Diagnosis not present

## 2023-05-20 DIAGNOSIS — M25512 Pain in left shoulder: Secondary | ICD-10-CM | POA: Diagnosis not present

## 2023-05-20 LAB — POCT GLYCOSYLATED HEMOGLOBIN (HGB A1C): HbA1c, POC (controlled diabetic range): 5.5 % (ref 0.0–7.0)

## 2023-05-20 NOTE — Progress Notes (Signed)
  SUBJECTIVE:   CHIEF COMPLAINT / HPI:   Left Shoulder and Right Hip pain Starting about a month ago, fell backwards and hit left shoulder and hurt right hip, hit electric fireplace/radio. Note's sharp pain w/ activity for both locations. Pain is noted to be sharp, notes that keeping his leg elevated/rested helps w/ symptoms. Has been using asper cream, but is concerned  with the expense. Pain at times is limiting activity, and he cannot raise his arm above his head.     PERTINENT  PMH / PSH:    Todd Hayes Care Team: Todd Martinez, MD as PCP - General (Family Medicine) OBJECTIVE:  BP 101/61   Pulse 80   Ht 6' (1.829 m)   Wt 220 lb 9.6 oz (100.1 kg)   SpO2 98%   BMI 29.92 kg/m  Physical Exam Musculoskeletal:     Right shoulder: Normal.     Left shoulder: Tenderness present. No swelling, deformity or effusion. Decreased range of motion. Normal strength.     Right hip: No deformity, lacerations or tenderness. Normal range of motion.     Comments: Pain with external rotation of arms for 90 degrees at side, pain with passive elevation of arm above shoulder line.   Negative SLR bilaterally      ASSESSMENT/PLAN:  Screening for diabetes mellitus -     POCT glycosylated hemoglobin (Hb A1C)  Acute pain of left shoulder Assessment & Plan: Todd Hayes notes maximum issues with shoulder pain following fall onto electric fireplace/radio.  Todd Hayes reports pain with activity through shoulder.  Todd Hayes reports some relief with Aspercreme although is worried about the expense.  Todd Hayes's shoulder exam with tenderness to palpation at shoulder head, and decreased range of motion.  Todd Hayes unable to lift arm above head secondary to pain.  Concern for soft tissue injury vs fracture or flaring arthritis. Will start with x-ray of shoulder.  Will also refer Todd Hayes to sports medicine for follow-up.  Will use topical OTC analgesia for pain. -DG left shoulder - Follow-up with sports medicine - Topical OTC  for analgesia  Orders: -     DG Shoulder Left; Future  Hip pain, right -     DG HIP UNILAT W OR W/O PELVIS 2-3 VIEWS RIGHT; Future  Pain of right hip Assessment & Plan: Todd Hayes presents with 1 month of right hip pain, following fall onto an electric fireplace/radio.  Todd Hayes notes pain with activity since the fall.  Todd Hayes physical exam reassuring, with good range of motion, and no tenderness to palpation.  Todd Hayes with negative straight leg raise suggesting against, nerve impingement.  Some concern for pain in back, although exam negative, and Todd Hayes seems to be focused on the hip.  Will obtain hip x-ray to evaluate for acute injury.  Recommend over-the-counter topical analgesia for pain control.  Todd Hayes pain may be coming secondary to arthritis, or possible abnormality/injury in the hip joint.  Will send Todd Hayes to sports medicine for follow-up. - DG right hip - Topical OTC analgesia (Voltaren/lidocaine patches) - Follow-up sports medicine    No follow-ups on file. Todd Kinds, MD 05/25/2023, 2:45 PM PGY-3, [facility]

## 2023-05-20 NOTE — Patient Instructions (Addendum)
It was great to see you! Thank you for allowing me to participate in your care!  Our plans for today:  - Shoulder/Hip Pain  -Will obtain X-ray of both joints  Go to:   [facility]   Address: [address] [place], [place], [place] [address] Phone: [phone]   -Will have you follow up with Sport's Medicine     [facility]   Address: [address] [place], [place], [place] [address] Phone: [phone] Can use Voltaren gel for pain control, or Salonpas patches       Take care and seek immediate care sooner if you develop any concerns.   Dr. [name], MD [facility]

## 2023-05-21 ENCOUNTER — Ambulatory Visit
Admission: RE | Admit: 2023-05-21 | Discharge: 2023-05-21 | Disposition: A | Discharge status: Home / Self Care | Payer: Medicare Other | Source: Ambulatory Visit | Attending: Family Medicine | Admitting: Family Medicine

## 2023-05-21 DIAGNOSIS — M25551 Pain in right hip: Secondary | ICD-10-CM

## 2023-05-21 DIAGNOSIS — M25512 Pain in left shoulder: Secondary | ICD-10-CM

## 2023-05-25 DIAGNOSIS — M25512 Pain in left shoulder: Secondary | ICD-10-CM | POA: Insufficient documentation

## 2023-05-25 NOTE — Assessment & Plan Note (Signed)
Patient presents with 1 month of right hip pain, following fall onto an electric fireplace/radio.  Patient notes pain with activity since the fall.  Patient physical exam reassuring, with good range of motion, and no tenderness to palpation.  Patient with negative straight leg raise suggesting against, nerve impingement.  Some concern for pain in back, although exam negative, and patient seems to be focused on the hip.  Will obtain hip x-ray to evaluate for acute injury.  Recommend over-the-counter topical analgesia for pain control.  Patient pain may be coming secondary to arthritis, or possible abnormality/injury in the hip joint.  Will send patient to sports medicine for follow-up. - DG right hip - Topical OTC analgesia (Voltaren/lidocaine patches) - Follow-up sports medicine

## 2023-05-25 NOTE — Assessment & Plan Note (Signed)
Patient notes maximum issues with shoulder pain following fall onto electric fireplace/radio.  Patient reports pain with activity through shoulder.  Patient reports some relief with Aspercreme although is worried about the expense.  Patient's shoulder exam with tenderness to palpation at shoulder head, and decreased range of motion.  Patient unable to lift arm above head secondary to pain.  Concern for soft tissue injury vs fracture or flaring arthritis. Will start with x-ray of shoulder.  Will also refer patient to sports medicine for follow-up.  Will use topical OTC analgesia for pain. -DG left shoulder - Follow-up with sports medicine - Topical OTC for analgesia

## 2023-05-29 ENCOUNTER — Telehealth: Payer: Self-pay

## 2023-05-29 NOTE — Telephone Encounter (Signed)
Patient called and results discussed.

## 2023-05-29 NOTE — Telephone Encounter (Signed)
Patient calls nurse line requesting results from recent xrays.   Will forward to ordering provider.

## 2023-06-09 ENCOUNTER — Other Ambulatory Visit: Payer: Self-pay | Admitting: Student

## 2023-06-09 DIAGNOSIS — G2581 Restless legs syndrome: Secondary | ICD-10-CM

## 2023-06-09 DIAGNOSIS — K219 Gastro-esophageal reflux disease without esophagitis: Secondary | ICD-10-CM

## 2023-06-10 ENCOUNTER — Other Ambulatory Visit: Payer: Self-pay | Admitting: Student

## 2023-06-10 DIAGNOSIS — I10 Essential (primary) hypertension: Secondary | ICD-10-CM

## 2023-06-10 DIAGNOSIS — M4716 Other spondylosis with myelopathy, lumbar region: Secondary | ICD-10-CM

## 2023-06-12 NOTE — Telephone Encounter (Signed)
Patient returns call to nurse line regarding continued left shoulder pain. He does not want to see sports medicine, he is requesting additional imaging.   Patient is asking for MRI of left shoulder due to continued pain.   Advised patient that I would send message to provider, however, he may need to be seen again prior to order being placed.   If possible, he is requesting that this be placed at [facility].   Will forward to Dr. Barbaraann Faster for further advisement.   Veronda Prude, RN

## 2023-06-13 NOTE — Telephone Encounter (Signed)
Patient calls nurse line again in regards to shoulder pain.   Patient reports severe pain in his shoulder. He states he can not take anything OTC for pain. He reports the muscle relaxers due help some, however he is requesting additional support.   He requests a MRI.   Will forward to provider who saw patient.

## 2023-06-19 NOTE — Telephone Encounter (Signed)
Please call patient and schedule follow up to further discuss shoulder pain and possible imaging.  Thanks CB

## 2023-06-19 NOTE — Telephone Encounter (Addendum)
Called patient. Patient voiced frustration with having to schedule appointment.  States, "I've waited this long to just have to schedule another appointment."  Patient states that he is in the bed right now and will call back to schedule appointment.   Veronda Prude, RN

## 2023-06-25 ENCOUNTER — Encounter: Payer: Self-pay | Admitting: Family Medicine

## 2023-06-25 ENCOUNTER — Ambulatory Visit (INDEPENDENT_AMBULATORY_CARE_PROVIDER_SITE_OTHER): Payer: Medicare Other | Admitting: Family Medicine

## 2023-06-25 VITALS — BP 109/75 | HR 71 | Wt 221.0 lb

## 2023-06-25 DIAGNOSIS — M25512 Pain in left shoulder: Secondary | ICD-10-CM | POA: Diagnosis not present

## 2023-06-25 MED ORDER — OXYCODONE HCL 5 MG PO TABS
5.0000 mg | ORAL_TABLET | ORAL | 0 refills | Status: AC | PRN
Start: 2023-06-25 — End: 2023-06-30

## 2023-06-25 NOTE — Assessment & Plan Note (Addendum)
Given lack of improvement in shoulder pain and range of motion over the past month with conservative measures.  I suspect patient has rotator cuff injury in the setting of trauma and likely osteoarthritis.  Unable to ascertain based on exam whether or not it is likely he has full or partial thickness tear.  On POCUS of left shoulder no obvious tear of supraspinatus or biceps tendon.  Discussed that patient would benefit from full shoulder ultrasound at sports medicine.  Referral placed.  Patient chronically taking Mobic for osteoarthritis will not add additional NSAID.  Discussed need for physical therapy regardless.  Referral placed.  Patient verbalized understanding.  Will do short course of oxycodone as patient still has severe pain.

## 2023-06-25 NOTE — Progress Notes (Cosign Needed Addendum)
    SUBJECTIVE:   CHIEF COMPLAINT / HPI: f/u shoulder pain  Still having pain. Still unable to lift heavy. Has not taken any OTC medications. Able to take shirt off. Still unable to lift above head. Using Asper Cream.  Pain has not improved at all over the past month. Walked into fireplace on anterior part of left shoulder.  This knocked him off balance and then fell onto left scapula.  Previously saw Dr. Barbaraann Faster.   Reports he is allergic to Tylenol and ibuprofen.  However takes Mobic daily out issue.  He was not aware that this is the same kind of medication.  Family believes that he is still allergic to ibuprofen.  States that he has taken Percocet without issue in the past.  But thinks that because it has oxycodone and this makes an allergic to him.  States he get the "jerks" when he takes these medications.    PERTINENT  PMH / PSH: COPD,   OBJECTIVE:   BP 109/75   Pulse 71   Wt 221 lb (100.2 kg)   SpO2 100%   BMI 29.97 kg/m   Left Shoulder Exam   Tenderness  The patient is experiencing tenderness in the biceps tendon.  Range of Motion  Active abduction:  70 abnormal  Passive abduction:  70 abnormal  Extension:  abnormal  External rotation:  normal  Forward flexion:  70  Internal rotation 0 degrees:  normal   Other  Erythema: absent Scars: absent Pulse: present   Comments:  Abduction, flexion left shoulder extremely limited due to pain.  Unable to perform further tests due to this.    Left shoulder x-rays personally reviewed and no acute obvious bony abnormality on radiology read.   ASSESSMENT/PLAN:   Acute pain of left shoulder Assessment & Plan: Given lack of improvement in shoulder pain and range of motion over the past month with conservative measures.  I suspect patient has rotator cuff injury in the setting of trauma and likely osteoarthritis.  Unable to ascertain based on exam whether or not it is likely he has full or partial thickness tear.  On POCUS of  left shoulder no obvious tear of supraspinatus or biceps tendon.  Discussed that patient would benefit from full shoulder ultrasound at sports medicine.  Referral placed.  Patient chronically taking Mobic for osteoarthritis will not add additional NSAID.  Discussed need for physical therapy regardless.  Referral placed.  Patient verbalized understanding.  Orders: -     Ambulatory referral to Physical Therapy -     Ambulatory referral to Sports Medicine -     oxyCODONE HCl; Take 1 tablet (5 mg total) by mouth every 4 (four) hours as needed for up to 5 days for severe pain.  Dispense: 30 tablet; Refill: 0   Return if symptoms worsen or fail to improve.  Celine Mans, MD [facility]

## 2023-06-25 NOTE — Patient Instructions (Signed)
It was great to see you! Thank you for allowing me to participate in your care!  Our plans for today:  - I placed a referral to Physical Therapy and Sports Medicine for an Ultrasound. They should call you to schedule. - Please take you pain medication daily for 1 week to help with pain.   Please arrive 15 minutes PRIOR to your next scheduled appointment time! If you do not, this affects OTHER patients' care.  Take care and seek immediate care sooner if you develop any concerns.   Celine Mans, MD, PGY-2 [facility] 4:42 PM 06/25/2023  [facility]

## 2023-06-27 ENCOUNTER — Other Ambulatory Visit: Payer: Self-pay | Admitting: Student

## 2023-06-27 DIAGNOSIS — K219 Gastro-esophageal reflux disease without esophagitis: Secondary | ICD-10-CM

## 2023-06-27 DIAGNOSIS — M4716 Other spondylosis with myelopathy, lumbar region: Secondary | ICD-10-CM

## 2023-06-27 DIAGNOSIS — G2581 Restless legs syndrome: Secondary | ICD-10-CM

## 2023-07-03 ENCOUNTER — Ambulatory Visit: Payer: Medicare Other | Admitting: Sports Medicine

## 2023-07-03 ENCOUNTER — Other Ambulatory Visit: Payer: Self-pay

## 2023-07-03 VITALS — BP 138/82 | Ht 72.0 in | Wt 221.0 lb

## 2023-07-03 DIAGNOSIS — M25512 Pain in left shoulder: Secondary | ICD-10-CM

## 2023-07-03 DIAGNOSIS — M7502 Adhesive capsulitis of left shoulder: Secondary | ICD-10-CM | POA: Diagnosis not present

## 2023-07-03 DIAGNOSIS — S46012A Strain of muscle(s) and tendon(s) of the rotator cuff of left shoulder, initial encounter: Secondary | ICD-10-CM

## 2023-07-03 NOTE — Patient Instructions (Signed)
[facility] [address] Radiology [address] [place],  [place]  [address] Main: [phone]

## 2023-07-03 NOTE — Progress Notes (Addendum)
PCP: Alfredo Martinez, MD  SUBJECTIVE:   HPI:  Patient is a 67 y.o. male here with chief complaint of left shoulder pain.  He reports that he injured his left shoulder while sleepwalking roughly 2 months ago where he fell on it.  He has had constant pain since and reduced range of motion.  He reports the pain ranges from an 8 to a 10 out of 10.  Pain located diffusely around his shoulder.  No radiation of pain.  He has been taking meloxicam for the pain but reports that he cannot take NSAIDs or Tylenol as it gives him the shakes.  He has been recently treated with a short course of oxycodone which has been helpful.  He has a prior rotator cuff tear on his right shoulder which she had repaired in 2018.    ROS:     See HPI  Past Medical History:  Diagnosis Date   Allergy    Chronic pain    GERD (gastroesophageal reflux disease)    Hyperlipidemia    Hypertension    Insomnia    Current Outpatient Medications on File Prior to Visit  Medication Sig Dispense Refill   albuterol (PROVENTIL HFA;VENTOLIN HFA) 108 (90 Base) MCG/ACT inhaler Inhale 2 puffs into the lungs every 6 (six) hours as needed for wheezing or shortness of breath. 1 Inhaler 2   baclofen (LIORESAL) 10 MG tablet TAKE 1 TABLET BY MOUTH ONCE OR TWICE DAILY AS NEEDED FOR MUSCLE SPASMS 180 tablet 1   fluticasone (FLONASE) 50 MCG/ACT nasal spray INSTILL 1 SPRAY INTO BOTH NOSTRILS DAILY 16 mL 11   lisinopril (ZESTRIL) 10 MG tablet TAKE 1 TABLET BY MOUTH EVERY DAY 90 tablet 0   meloxicam (MOBIC) 7.5 MG tablet TAKE 1 TABLET BY MOUTH EVERY DAY AS NEEDED 20 tablet 0   omega-3 acid ethyl esters (LOVAZA) 1 g capsule Take 2 capsules (2 g total) by mouth 2 (two) times daily. 120 capsule 5   pantoprazole (PROTONIX) 20 MG tablet TAKE 1 TABLET BY MOUTH EVERY DAY 90 tablet 0   rOPINIRole (REQUIP) 0.25 MG tablet TAKE 2 TABLETS (0.5 MG TOTAL) BY MOUTH AT BEDTIME.FILL 09/18/18 180 tablet 0   sildenafil (REVATIO) 20 MG tablet TAKE 5 TABLETS BY MOUTH 1  HOUR BEFORE NEEDED DO NOT USE MORE THAN ONCE DAILY 50 tablet 2   traZODone (DESYREL) 150 MG tablet TAKE 2 TABLETS BY MOUTH AT BEDTIME 180 tablet 1   No current facility-administered medications on file prior to visit.    Past Surgical History:  Procedure Laterality Date   SHOULDER SURGERY Right     Allergies  Allergen Reactions   Advil [Ibuprofen] Other (See Comments)    jerks   Cyclobenzaprine     Other reaction(s): Other States he gives him the jerks   Flexeril [Cyclobenzaprine Hcl]     OBJECTIVE:  BP 138/82   Ht 6' (1.829 m)   Wt 221 lb (100.2 kg)   BMI 29.97 kg/m   PHYSICAL EXAM:  GEN: Alert and Oriented, NAD, comfortable in exam room RESP: Unlabored respirations, symmetric chest rise PSY: normal mood, congruent affect   MSK EXAM: Left Shoulder MSK Exam: No swelling, ecchymoses.  No gross deformity. Tenderness to palpation at [facility] joint, bicipital groove, lateral shoulder and posterior shoulder. AROM: 30 degrees flexion, 30 degrees abduction, 5 degrees external rotation, internal rotation to the iliac crest PROM: 50 degrees flexion, 50 degrees abduction, 10 degrees external rotation, internal rotation to the iliac crest Positive Ananias Pilgrim,  speeds Negative Yergasons. Strength 4 out of 5 external rotation, 5 out of 5 internal rotation, unable to perform empty can test secondary to pain. NV intact distally.   MSK Limited US of Shoulder, Left Patient was seated on exam table and shoulder US examination was performed using high frequency linear probe. The biceps tendon was visualized within the bicipital groove in both longitudinal and transverse axis with moderate hypoechoic fluid flexion surrounding the tendon. Tendon fibers intact with minimal fiber irregularity. The subscapularis tendon was visualized in both longitudinal and transverse axis with tenderness disruption at the insertion and plate of the inferior lesser tubercle of the humerus and mild hypoechoic  changes within the tissue.  Full-thickness disruption of the supraspinatus tendon noted in both logic and transverse axis.  Narrow space between humeral head and acromion.  The infraspinatus and teres minor tendons were visualized in both longitudinal and transverse axis with tendon insertion at the middle facet of the great tubercle of the humerus with no signs of tearing, hypoechoic changes or tissue irregularity seen.  Difficulty visualizing the posterior glenohumeral joint to assess proper alignment, joint space, or arthritic changes. The [facility] Joint was visualized with mild bursal distension though no significant bony spurs/arthritic changes.   IMPRESSION: Full-thickness tear of the supraspinatus tendon with retraction that appears chronic.  Partial-thickness tear of subscapularis tendon and long head of the biceps tendon.  Unable to assess degree of arthritic changes of the glenohumeral joint. Ultrasound performed and interpreted by Glean Salen, MD    ASSESSMENT & PLAN:  1. Traumatic complete tear of left rotator cuff, initial encounter 2. Adhesive capsulitis of left shoulder  History, exam, and ultrasound of the left shoulder consistent with full-thickness supraspinatus tear and partial-thickness subscapularis and long head biceps tendon tear.  He also has a significant amount of frozen shoulder on exam.  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  I recommended rehabilitation and consideration of a corticosteroid injection both glenohumeral and subacromial.  The patient declined both therapy and injection at this time and would like surgical evaluation as has had prior success with a right rotator cuff repair in 2018.  However given the ultrasound of endings with retraction of his supraspinatus I am fearful that this may be a very challenging surgical repair.  I reviewed  these concerns with the patient and his wife however they would still like to proceed that route.  We will get an MRI of the shoulder to better characterize the extent of the supraspinatus tear and atrophy prior to surgical evaluation.  Follow-up after his MRI.  Continue meloxicam and icing for pain control.   Glean Salen, MD PGY-4, Sports Medicine Fellow [facility]  Addendum:  Patient seen in the office by fellow.  His history, exam, plan of care were precepted with me.  Norton Blizzard MD Marrianne Mood

## 2023-07-09 ENCOUNTER — Other Ambulatory Visit: Payer: Self-pay

## 2023-07-09 DIAGNOSIS — M4716 Other spondylosis with myelopathy, lumbar region: Secondary | ICD-10-CM

## 2023-07-09 NOTE — Telephone Encounter (Signed)
Patient calls nurse line in regards to Meloxicam.   He reports he takes one tablet daily for arthritis.   He reports this was denied by his PCP.   Will forward to PCP.

## 2023-07-10 MED ORDER — MELOXICAM 7.5 MG PO TABS: 7.5000 mg | ORAL_TABLET | Freq: Every day | ORAL | 0 refills | Status: DC | PRN

## 2023-07-13 ENCOUNTER — Ambulatory Visit (HOSPITAL_COMMUNITY)
Admission: RE | Admit: 2023-07-13 | Discharge: 2023-07-13 | Disposition: A | Discharge status: Home / Self Care | Payer: Medicare Other | Source: Ambulatory Visit | Attending: Sports Medicine | Admitting: Sports Medicine

## 2023-07-13 DIAGNOSIS — S46012A Strain of muscle(s) and tendon(s) of the rotator cuff of left shoulder, initial encounter: Secondary | ICD-10-CM | POA: Insufficient documentation

## 2023-07-13 DIAGNOSIS — S46212A Strain of muscle, fascia and tendon of other parts of biceps, left arm, initial encounter: Secondary | ICD-10-CM | POA: Diagnosis not present

## 2023-07-13 DIAGNOSIS — M75122 Complete rotator cuff tear or rupture of left shoulder, not specified as traumatic: Secondary | ICD-10-CM | POA: Diagnosis not present

## 2023-07-13 DIAGNOSIS — S46812A Strain of other muscles, fascia and tendons at shoulder and upper arm level, left arm, initial encounter: Secondary | ICD-10-CM | POA: Diagnosis not present

## 2023-07-13 DIAGNOSIS — M7582 Other shoulder lesions, left shoulder: Secondary | ICD-10-CM | POA: Diagnosis not present

## 2023-07-24 ENCOUNTER — Ambulatory Visit: Payer: Medicare Other | Admitting: Sports Medicine

## 2023-07-24 ENCOUNTER — Other Ambulatory Visit: Payer: Self-pay

## 2023-07-24 VITALS — BP 132/64 | Ht 72.0 in | Wt 221.0 lb

## 2023-07-24 DIAGNOSIS — S46012A Strain of muscle(s) and tendon(s) of the rotator cuff of left shoulder, initial encounter: Secondary | ICD-10-CM

## 2023-07-24 DIAGNOSIS — M7502 Adhesive capsulitis of left shoulder: Secondary | ICD-10-CM | POA: Diagnosis not present

## 2023-07-24 DIAGNOSIS — S46012D Strain of muscle(s) and tendon(s) of the rotator cuff of left shoulder, subsequent encounter: Secondary | ICD-10-CM | POA: Diagnosis not present

## 2023-07-24 MED ORDER — METHYLPREDNISOLONE ACETATE 40 MG/ML IJ SUSP
40.0000 mg | Freq: Once | INTRAMUSCULAR | Status: AC
  Administered 2023-07-24: 40 mg via INTRA_ARTICULAR

## 2023-07-24 NOTE — Patient Instructions (Signed)
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 We did a steroid injection into the shoulder joint today. I recommend you start frozen shoulder exercises today (wall walks and pendulum swings) twice daily and then work with physical therapy for additional motion and strength training. Follow-up in 4 weeks.

## 2023-07-24 NOTE — Progress Notes (Signed)
PCP: Alfredo Martinez, MD  SUBJECTIVE:   HPI:  Patient is a 67 y.o. male here for follow-up on MRI results of left shoulder. He notes persistent pain and reduced ROM in the shoulder unchanged from prior. Has not been consistently doing wall walks or pendulum swings since our last visit.  ROS:     See HPI  PERTINENT  PMH / PSH FH / / SH:  Past Medical, Surgical, Social, and Family History Reviewed & Updated in the EMR.  Pertinent findings include:  Chronic Pain, GERD, HTN, HLD Prior right RTC tear s/p repair (2018)  Allergies  Allergen Reactions   Advil [Ibuprofen] Other (See Comments)    jerks   Cyclobenzaprine     Other reaction(s): Other States he gives him the jerks   Flexeril [Cyclobenzaprine Hcl]    OBJECTIVE:  BP 132/64   Ht 6' (1.829 m)   Wt 221 lb (100.2 kg)   BMI 29.97 kg/m   PHYSICAL EXAM:  GEN: Alert and Oriented, NAD, comfortable in exam room RESP: Unlabored respirations, symmetric chest rise PSY: normal mood, congruent affect   MSK EXAM: No significant change in exam compared to visit 07/03/23.   ASSESSMENT & PLAN:  1. Adhesive capsulitis of left shoulder 2. Traumatic complete tear of left rotator cuff, subsequent encounter  No significant interval change in pain or ROM. We reviewed his MRI findings which show a chronic supraspinatus and infraspinatus tear, 3.1 cm retracted as well as GH arthritis and adhesive capsulitis. Believe his primary pain driver to be the adhesive capsulitis. He initially was hoping for RTC repair surgery, however we discussed that given extent of RTC retraction he would rather likely need reverse shoulder replacement. Patient states he would like time to consider this before referral to ortho. He is now amenable to cortisone injection and formal referral to PT which was placed. Recommend f/u in 1 month for repeat evaluation and consideration of another injection with suprascapular nerve block as well if pain not improved.      Procedure: Ultrasound guided injection of the left glenohumeral joint. INFORMED CONSENT: I discussed the risks, possible benefits, and alternatives to injection.  Following denial of allergy and review of potential side effects and complications (including but not necessarily limited to infection, allergic reaction, local tissue breakdown, systemic effects of corticosteroids, elevation of blood glucose, injury to soft tissue and/or nerves and seizure), all questions were answered and consent was given to proceed.  Patient verbalized understanding. Timeout was performed. PROCEDURE DETAILS: The posterolateral shoulder  was prepared with alcohol swab x2.  Then this area was re-examined using the same transducer, sterile gel in the no-touch technique. While maintaining ultrasound visualization of the target, a 3.5-inch, 22-gauge needle was advanced into the glenohumeral joint and 6cc lidocaine 1% with 1cc of depomedrol 40mg /cc  was injected into the joint. The patient tolerated the procedure without complication.    Glean Salen, MD PGY-4, Sports Medicine Fellow [facility]  Addendum:  Patient seen in the office by fellow.  His history, exam, plan of care were precepted with me.  Norton Blizzard MD Marrianne Mood

## 2023-07-31 ENCOUNTER — Other Ambulatory Visit: Payer: Self-pay

## 2023-07-31 ENCOUNTER — Ambulatory Visit: Payer: Medicare Other | Attending: Sports Medicine

## 2023-07-31 DIAGNOSIS — M25612 Stiffness of left shoulder, not elsewhere classified: Secondary | ICD-10-CM | POA: Insufficient documentation

## 2023-07-31 DIAGNOSIS — S46012D Strain of muscle(s) and tendon(s) of the rotator cuff of left shoulder, subsequent encounter: Secondary | ICD-10-CM | POA: Diagnosis not present

## 2023-07-31 DIAGNOSIS — M7502 Adhesive capsulitis of left shoulder: Secondary | ICD-10-CM | POA: Diagnosis not present

## 2023-07-31 DIAGNOSIS — G8929 Other chronic pain: Secondary | ICD-10-CM | POA: Diagnosis not present

## 2023-07-31 DIAGNOSIS — M6281 Muscle weakness (generalized): Secondary | ICD-10-CM | POA: Diagnosis not present

## 2023-07-31 DIAGNOSIS — M25512 Pain in left shoulder: Secondary | ICD-10-CM | POA: Diagnosis not present

## 2023-07-31 NOTE — Therapy (Signed)
OUTPATIENT PHYSICAL THERAPY SHOULDER EVALUATION   Patient Name: Todd Hayes MRN: [id number] DOB:05-29-1956, 67 y.o., male Today's Date: 07/31/2023  END OF SESSION:  PT End of Session - 07/31/23 1400     Visit Number 1    Number of Visits 6    Date for PT Re-Evaluation 09/06/23    PT Start Time 1400    PT Stop Time 1430    PT Time Calculation (min) 30 min    Activity Tolerance Patient limited by pain    Behavior During Therapy [facility] for tasks assessed/performed             Past Medical History:  Diagnosis Date   Allergy    Chronic pain    GERD (gastroesophageal reflux disease)    Hyperlipidemia    Hypertension    Insomnia    Past Surgical History:  Procedure Laterality Date   SHOULDER SURGERY Right    Patient Active Problem List   Diagnosis Date Noted   Shoulder pain, left 05/25/2023   Macrocytosis without anemia 02/10/2021   Hip pain 02/03/2020   CKD (chronic kidney disease) stage 3, GFR 30-59 ml/min (HCC) 06/20/2019   Erectile dysfunction 07/09/2017   COPD (chronic obstructive pulmonary disease) (HCC) 05/24/2015   Lumbar spondylosis with myelopathy 06/23/2014   Tobacco use disorder 04/21/2014   GERD (gastroesophageal reflux disease) 06/25/2013   Primary hypertension 08/08/2011   Hyperlipidemia 08/08/2011   Chronic pain 05/10/2011   Insomnia 05/10/2011   REFERRING PROVIDER: Marisa Cyphers, MD   REFERRING DIAG: Adhesive capsulitis of left shoulder; Traumatic complete tear of left rotator cuff, initial encounter   THERAPY DIAG:  Chronic left shoulder pain  Stiffness of left shoulder, not elsewhere classified  Muscle weakness (generalized)  Rationale for Evaluation and Treatment: Rehabilitation  ONSET DATE: 3 months ago  SUBJECTIVE:                                                                                                                                                                                      SUBJECTIVE STATEMENT: Patient  reports that he fell about 3 months ago while he was sleep walking. He ended up injuring his left shoulder and tearing his rotator cuff. He has been having a lot of pain in his shoulder down to his elbow since it began. He has tried an injection, but this only helped for about 2 days.  Hand dominance: Right  PERTINENT HISTORY: Hypertension, COPD, chronic kidney disease, allergies, and current smoker  PAIN:  Are you having pain? Yes: NPRS scale: 10/10 Pain location: left shoulder radiating to his elbow Pain description: constant popping and pain  Aggravating factors: moving his  arm Relieving factors: topical cream  PRECAUTIONS: Fall  RED FLAGS: None   WEIGHT BEARING RESTRICTIONS: No  FALLS:  Has patient fallen in last 6 months? Yes. Number of falls 1  LIVING ENVIRONMENT: Lives with: lives with their partner Lives in: House/apartment  OCCUPATION: Disabled  PLOF: Independent  PATIENT GOALS: reduced pain and improved shoulder mobility  NEXT MD VISIT: 08/21/23  OBJECTIVE:   DIAGNOSTIC FINDINGS: left shoulder MRI 07/13/23 IMPRESSION: 1. Complete tear of the supraspinatus and infraspinatus tendons with 3.1 cm of retraction. 2. Moderate tendinosis of the subscapularis tendon. 3. Mild-moderate tendinosis of the intra-articular portion of the long head of the biceps tendon with a short-segment longitudinal split tear. 4. Mild osteoarthritis of the left glenohumeral joint. 5. Thickening of the inferior joint capsule as can be seen with adhesive capsulitis.  PATIENT SURVEYS:  FOTO 34.25  COGNITION: Overall cognitive status: Within functional limits for tasks assessed     SENSATION: Patient reports no numbness or tingling  POSTURE: Forward head   UPPER EXTREMITY ROM:   Active ROM Right eval Left eval  Shoulder flexion 140 53/ 87 (AAROM)  Shoulder extension    Shoulder abduction 120 57  Shoulder adduction    Shoulder internal rotation To L4  To left gluteals   Shoulder external rotation To T2 Unable to reach left ear   Elbow flexion    Elbow extension    Wrist flexion    Wrist extension    Wrist ulnar deviation    Wrist radial deviation    Wrist pronation    Wrist supination    (Blank rows = not tested)  UPPER EXTREMITY MMT:  MMT Right eval Left eval  Shoulder flexion 4-/5 Unable to assess   Shoulder extension    Shoulder abduction 4-/5 Unable to assess   Shoulder adduction    Shoulder internal rotation    Shoulder external rotation    Middle trapezius    Lower trapezius    Elbow flexion    Elbow extension    Wrist flexion    Wrist extension    Wrist ulnar deviation    Wrist radial deviation    Wrist pronation    Wrist supination    Grip strength (lbs) 50 35  (Blank rows = not tested)  JOINT MOBILITY TESTING:  Left AC joint: hypomobile and painful   PALPATION:  TTP: left supraspinatus, biceps, subscapularis, AC joint, and deltoid   TODAY'S TREATMENT:                                                                                                                                         DATE:    PATIENT EDUCATION: Education details: Plan of care, prognosis, healing, anatomy, objective findings, and goals for therapy Person educated: Patient Education method: Explanation Education comprehension: verbalized understanding  HOME EXERCISE PROGRAM:   ASSESSMENT:  CLINICAL IMPRESSION: Patient is a 67 y.o. male  who was seen today for physical therapy evaluation and treatment for left shoulder pain and stiffness secondary to a fall 3 months ago.  He presented with high pain severity and irritability with left shoulder active range of motion reproducing his familiar pain.  He exhibited significant deficits in left shoulder mobility and strength compared to the right shoulder.  Recommend that he continue with skilled physical therapy to address his impairments to maximize his functional mobility.  OBJECTIVE IMPAIRMENTS:  decreased activity tolerance, decreased ROM, decreased strength, impaired tone, impaired UE functional use, and pain.   ACTIVITY LIMITATIONS: carrying, lifting, bathing, toileting, dressing, reach over head, and hygiene/grooming  PARTICIPATION LIMITATIONS: meal prep, cleaning, laundry, driving, shopping, and community activity  PERSONAL FACTORS: Past/current experiences, Time since onset of injury/illness/exacerbation, and 3+ comorbidities: Hypertension, COPD, chronic kidney disease, allergies, and current smoker  are also affecting patient's functional outcome.   REHAB POTENTIAL: Fair    CLINICAL DECISION MAKING: Evolving/moderate complexity  EVALUATION COMPLEXITY: Moderate   GOALS: Goals reviewed with patient? Yes  LONG TERM GOALS: Target date: 08/28/23  Patient will be independent with his HEP. Baseline:  Goal status: INITIAL  2.  Patient will be able to complete his daily activities without his familiar pain exceeding 7/10. Baseline:  Goal status: INITIAL  3.  Patient will be able to demonstrate at least 90 degrees of left shoulder flexion for improved function reaching. Baseline:  Goal status: INITIAL  4.  Patient will be able to carry at least 5 pounds with his left upper extremity for improved function carrying the groceries. Baseline:  Goal status: INITIAL  5.  Patient will be able to demonstrate at least 90 degrees of left shoulder abduction for improved function with household activities. Baseline:  Goal status: INITIAL  PLAN:  PT FREQUENCY: 1-2x/week  PT DURATION: 4 weeks  PLANNED INTERVENTIONS: Therapeutic exercises, Therapeutic activity, Neuromuscular re-education, Patient/Family education, Self Care, Joint mobilization, Electrical stimulation, Cryotherapy, Moist heat, Taping, Vasopneumatic device, Ultrasound, Manual therapy, and Re-evaluation  PLAN FOR NEXT SESSION: Pulleys, isometrics, manual therapy, and modalities as needed   Granville Lewis,  PT 07/31/2023, 3:47 PM

## 2023-08-06 ENCOUNTER — Ambulatory Visit: Payer: Medicare Other

## 2023-08-06 DIAGNOSIS — S46012D Strain of muscle(s) and tendon(s) of the rotator cuff of left shoulder, subsequent encounter: Secondary | ICD-10-CM | POA: Diagnosis not present

## 2023-08-06 DIAGNOSIS — G8929 Other chronic pain: Secondary | ICD-10-CM | POA: Diagnosis not present

## 2023-08-06 DIAGNOSIS — M6281 Muscle weakness (generalized): Secondary | ICD-10-CM

## 2023-08-06 DIAGNOSIS — M25512 Pain in left shoulder: Secondary | ICD-10-CM | POA: Diagnosis not present

## 2023-08-06 DIAGNOSIS — M25612 Stiffness of left shoulder, not elsewhere classified: Secondary | ICD-10-CM

## 2023-08-06 DIAGNOSIS — M7502 Adhesive capsulitis of left shoulder: Secondary | ICD-10-CM | POA: Diagnosis not present

## 2023-08-06 NOTE — Therapy (Signed)
OUTPATIENT PHYSICAL THERAPY SHOULDER TREATMENT   Patient Name: Todd Hayes MRN: [id number] DOB:[birth year], 67 y.o., male Today's Date: 08/06/2023  END OF SESSION:  PT End of Session - 08/06/23 1316     Visit Number 2    Number of Visits 6    Date for PT Re-Evaluation 09/06/23    PT Start Time 1300    PT Stop Time 1328    PT Time Calculation (min) 28 min    Activity Tolerance Patient limited by pain    Behavior During Therapy [facility] for tasks assessed/performed              Past Medical History:  Diagnosis Date   Allergy    Chronic pain    GERD (gastroesophageal reflux disease)    Hyperlipidemia    Hypertension    Insomnia    Past Surgical History:  Procedure Laterality Date   SHOULDER SURGERY Right    Patient Active Problem List   Diagnosis Date Noted   Shoulder pain, left 05/25/2023   Macrocytosis without anemia 02/10/2021   Hip pain 02/03/2020   CKD (chronic kidney disease) stage 3, GFR 30-59 ml/min (HCC) 06/20/2019   Erectile dysfunction 07/09/2017   COPD (chronic obstructive pulmonary disease) (HCC) 05/24/2015   Lumbar spondylosis with myelopathy 06/23/2014   Tobacco use disorder 04/21/2014   GERD (gastroesophageal reflux disease) 06/25/2013   Primary hypertension 08/08/2011   Hyperlipidemia 08/08/2011   Chronic pain 05/10/2011   Insomnia 05/10/2011   REFERRING PROVIDER: Marisa Cyphers, MD   REFERRING DIAG: Adhesive capsulitis of left shoulder; Traumatic complete tear of left rotator cuff, initial encounter   THERAPY DIAG:  Chronic left shoulder pain  Stiffness of left shoulder, not elsewhere classified  Muscle weakness (generalized)  Rationale for Evaluation and Treatment: Rehabilitation  ONSET DATE: 3 months ago  SUBJECTIVE:                                                                                                                                                                                      SUBJECTIVE STATEMENT: Patient  reports that his shoulder is really hurting today.  Hand dominance: Right  PERTINENT HISTORY: Hypertension, COPD, chronic kidney disease, allergies, and current smoker  PAIN:  Are you having pain? Yes: NPRS scale: 10/10 Pain location: left shoulder radiating to his elbow Pain description: constant popping and pain  Aggravating factors: moving his arm Relieving factors: topical cream  PRECAUTIONS: Fall  RED FLAGS: None   WEIGHT BEARING RESTRICTIONS: No  FALLS:  Has patient fallen in last 6 months? Yes. Number of falls 1  LIVING ENVIRONMENT: Lives with: lives with their partner Lives in: House/apartment  OCCUPATION:  Disabled  PLOF: Independent  PATIENT GOALS: reduced pain and improved shoulder mobility  NEXT MD VISIT: 08/21/23  OBJECTIVE: alll objective measures were assessed at his initial evaluation on 07/31/23 unless otherwise noted  DIAGNOSTIC FINDINGS: left shoulder MRI 07/13/23 IMPRESSION: 1. Complete tear of the supraspinatus and infraspinatus tendons with 3.1 cm of retraction. 2. Moderate tendinosis of the subscapularis tendon. 3. Mild-moderate tendinosis of the intra-articular portion of the long head of the biceps tendon with a short-segment longitudinal split tear. 4. Mild osteoarthritis of the left glenohumeral joint. 5. Thickening of the inferior joint capsule as can be seen with adhesive capsulitis.  PATIENT SURVEYS:  FOTO 34.25  COGNITION: Overall cognitive status: Within functional limits for tasks assessed     SENSATION: Patient reports no numbness or tingling  POSTURE: Forward head   UPPER EXTREMITY ROM:   Active ROM Right eval Left eval  Shoulder flexion 140 53/ 87 (AAROM)  Shoulder extension    Shoulder abduction 120 57  Shoulder adduction    Shoulder internal rotation To L4  To left gluteals  Shoulder external rotation To T2 Unable to reach left ear   Elbow flexion    Elbow extension    Wrist flexion    Wrist extension     Wrist ulnar deviation    Wrist radial deviation    Wrist pronation    Wrist supination    (Blank rows = not tested)  UPPER EXTREMITY MMT:  MMT Right eval Left eval  Shoulder flexion 4-/5 Unable to assess   Shoulder extension    Shoulder abduction 4-/5 Unable to assess   Shoulder adduction    Shoulder internal rotation    Shoulder external rotation    Middle trapezius    Lower trapezius    Elbow flexion    Elbow extension    Wrist flexion    Wrist extension    Wrist ulnar deviation    Wrist radial deviation    Wrist pronation    Wrist supination    Grip strength (lbs) 50 35  (Blank rows = not tested)  JOINT MOBILITY TESTING:  Left AC joint: hypomobile and painful   PALPATION:  TTP: left supraspinatus, biceps, subscapularis, AC joint, and deltoid   TODAY'S TREATMENT:                                                                                                                                         DATE:                                    08/06/23 EXERCISE LOG  Exercise Repetitions and Resistance Comments  Isometric shoulder flexion  Unable due to pain    Isometric shoulder ADD 5 reps w/ 5 second hold    Ball roll out  2 minutes Flexion   Pulleys  2 minutes  Flexion   Elbow flexion 15 reps    Therabar bending  Red t-bar x 20 reps  Up only   UE ranger (seated)  30 seconds each  CW and CCW circles    Blank cell = exercise not performed today  Modalities: no redness or adverse reactions to today's modalities  Date:  Hot Pack: Shoulder, 8 mins, Pain  PATIENT EDUCATION: Education details: exercise Person educated: Patient Education method: Explanation Education comprehension: verbalized understanding  HOME EXERCISE PROGRAM:   ASSESSMENT:  CLINICAL IMPRESSION: Patient presented to treatment with high pain severity and irritability. He was attempted to be introduced to multiple new interventions for reduced pain and improved shoulder mobility. However, he  was significantly limited due to his familiar pain. He required significant multimodal cueing with today's interventions for proper exercise performance, but he continued to experience difficulty maintaining proper biomechanics. Moist heat to his left shoulder was able to slightly reduce his familiar symptoms, but he continued to experience elevated pain levels. Recommend that he continue with his current plan of care to address his remaining impairments to maximize his functional mobility.   OBJECTIVE IMPAIRMENTS: decreased activity tolerance, decreased ROM, decreased strength, impaired tone, impaired UE functional use, and pain.   ACTIVITY LIMITATIONS: carrying, lifting, bathing, toileting, dressing, reach over head, and hygiene/grooming  PARTICIPATION LIMITATIONS: meal prep, cleaning, laundry, driving, shopping, and community activity  PERSONAL FACTORS: Past/current experiences, Time since onset of injury/illness/exacerbation, and 3+ comorbidities: Hypertension, COPD, chronic kidney disease, allergies, and current smoker  are also affecting patient's functional outcome.   REHAB POTENTIAL: Fair    CLINICAL DECISION MAKING: Evolving/moderate complexity  EVALUATION COMPLEXITY: Moderate   GOALS: Goals reviewed with patient? Yes  LONG TERM GOALS: Target date: 08/28/23  Patient will be independent with his HEP. Baseline:  Goal status: INITIAL  2.  Patient will be able to complete his daily activities without his familiar pain exceeding 7/10. Baseline:  Goal status: INITIAL  3.  Patient will be able to demonstrate at least 90 degrees of left shoulder flexion for improved function reaching. Baseline:  Goal status: INITIAL  4.  Patient will be able to carry at least 5 pounds with his left upper extremity for improved function carrying the groceries. Baseline:  Goal status: INITIAL  5.  Patient will be able to demonstrate at least 90 degrees of left shoulder abduction for improved  function with household activities. Baseline:  Goal status: INITIAL  PLAN:  PT FREQUENCY: 1-2x/week  PT DURATION: 4 weeks  PLANNED INTERVENTIONS: Therapeutic exercises, Therapeutic activity, Neuromuscular re-education, Patient/Family education, Self Care, Joint mobilization, Electrical stimulation, Cryotherapy, Moist heat, Taping, Vasopneumatic device, Ultrasound, Manual therapy, and Re-evaluation  PLAN FOR NEXT SESSION: Pulleys, isometrics, manual therapy, and modalities as needed   Granville Lewis, PT 08/06/2023, 6:30 PM

## 2023-08-07 ENCOUNTER — Other Ambulatory Visit: Payer: Self-pay | Admitting: Student

## 2023-08-07 DIAGNOSIS — M4716 Other spondylosis with myelopathy, lumbar region: Secondary | ICD-10-CM

## 2023-08-08 ENCOUNTER — Ambulatory Visit: Payer: Medicare Other | Admitting: *Deleted

## 2023-08-08 ENCOUNTER — Encounter: Payer: Self-pay | Admitting: *Deleted

## 2023-08-08 DIAGNOSIS — M25612 Stiffness of left shoulder, not elsewhere classified: Secondary | ICD-10-CM | POA: Diagnosis not present

## 2023-08-08 DIAGNOSIS — S46012D Strain of muscle(s) and tendon(s) of the rotator cuff of left shoulder, subsequent encounter: Secondary | ICD-10-CM | POA: Diagnosis not present

## 2023-08-08 DIAGNOSIS — M7502 Adhesive capsulitis of left shoulder: Secondary | ICD-10-CM | POA: Diagnosis not present

## 2023-08-08 DIAGNOSIS — M6281 Muscle weakness (generalized): Secondary | ICD-10-CM

## 2023-08-08 DIAGNOSIS — M25512 Pain in left shoulder: Secondary | ICD-10-CM | POA: Diagnosis not present

## 2023-08-08 DIAGNOSIS — G8929 Other chronic pain: Secondary | ICD-10-CM

## 2023-08-08 NOTE — Therapy (Signed)
OUTPATIENT PHYSICAL THERAPY SHOULDER TREATMENT   Patient Name: Todd Hayes MRN: [id number] DOB:[birth year], 67 y.o., male Today's Date: 08/08/2023  END OF SESSION:  PT End of Session - 08/08/23 1257     Visit Number 3    Number of Visits 6    Date for PT Re-Evaluation 09/06/23    PT Start Time 1300    PT Stop Time 1347    PT Time Calculation (min) 47 min              Past Medical History:  Diagnosis Date   Allergy    Chronic pain    GERD (gastroesophageal reflux disease)    Hyperlipidemia    Hypertension    Insomnia    Past Surgical History:  Procedure Laterality Date   SHOULDER SURGERY Right    Patient Active Problem List   Diagnosis Date Noted   Shoulder pain, left 05/25/2023   Macrocytosis without anemia 02/10/2021   Hip pain 02/03/2020   CKD (chronic kidney disease) stage 3, GFR 30-59 ml/min (HCC) 06/20/2019   Erectile dysfunction 07/09/2017   COPD (chronic obstructive pulmonary disease) (HCC) 05/24/2015   Lumbar spondylosis with myelopathy 06/23/2014   Tobacco use disorder 04/21/2014   GERD (gastroesophageal reflux disease) 06/25/2013   Primary hypertension 08/08/2011   Hyperlipidemia 08/08/2011   Chronic pain 05/10/2011   Insomnia 05/10/2011   REFERRING PROVIDER: Marisa Cyphers, MD   REFERRING DIAG: Adhesive capsulitis of left shoulder; Traumatic complete tear of left rotator cuff, initial encounter   THERAPY DIAG:  Chronic left shoulder pain  Stiffness of left shoulder, not elsewhere classified  Muscle weakness (generalized)  Rationale for Evaluation and Treatment: Rehabilitation  ONSET DATE: 3 months ago  SUBJECTIVE:                                                                                                                                                                                      SUBJECTIVE STATEMENT: Patient reports that his shoulder is really hurting today. Not getting better Hand dominance: Right  PERTINENT  HISTORY: Hypertension, COPD, chronic kidney disease, allergies, and current smoker  PAIN:  Are you having pain? Yes: NPRS scale: 9-10/10 Pain location: left shoulder radiating to his elbow Pain description: constant popping and pain  Aggravating factors: moving his arm Relieving factors: topical cream  PRECAUTIONS: Fall  RED FLAGS: None   WEIGHT BEARING RESTRICTIONS: No  FALLS:  Has patient fallen in last 6 months? Yes. Number of falls 1  LIVING ENVIRONMENT: Lives with: lives with their partner Lives in: House/apartment  OCCUPATION: Disabled  PLOF: Independent  PATIENT GOALS: reduced pain and improved shoulder mobility  NEXT MD VISIT:  08/21/23  OBJECTIVE: alll objective measures were assessed at his initial evaluation on 07/31/23 unless otherwise noted  DIAGNOSTIC FINDINGS: left shoulder MRI 07/13/23 IMPRESSION: 1. Complete tear of the supraspinatus and infraspinatus tendons with 3.1 cm of retraction. 2. Moderate tendinosis of the subscapularis tendon. 3. Mild-moderate tendinosis of the intra-articular portion of the long head of the biceps tendon with a short-segment longitudinal split tear. 4. Mild osteoarthritis of the left glenohumeral joint. 5. Thickening of the inferior joint capsule as can be seen with adhesive capsulitis.  PATIENT SURVEYS:  FOTO 34.25  COGNITION: Overall cognitive status: Within functional limits for tasks assessed     SENSATION: Patient reports no numbness or tingling  POSTURE: Forward head   UPPER EXTREMITY ROM:   Active ROM Right eval Left eval  Shoulder flexion 140 53/ 87 (AAROM)  Shoulder extension    Shoulder abduction 120 57  Shoulder adduction    Shoulder internal rotation To L4  To left gluteals  Shoulder external rotation To T2 Unable to reach left ear   Elbow flexion    Elbow extension    Wrist flexion    Wrist extension    Wrist ulnar deviation    Wrist radial deviation    Wrist pronation    Wrist supination     (Blank rows = not tested)  UPPER EXTREMITY MMT:  MMT Right eval Left eval  Shoulder flexion 4-/5 Unable to assess   Shoulder extension    Shoulder abduction 4-/5 Unable to assess   Shoulder adduction    Shoulder internal rotation    Shoulder external rotation    Middle trapezius    Lower trapezius    Elbow flexion    Elbow extension    Wrist flexion    Wrist extension    Wrist ulnar deviation    Wrist radial deviation    Wrist pronation    Wrist supination    Grip strength (lbs) 50 35  (Blank rows = not tested)  JOINT MOBILITY TESTING:  Left AC joint: hypomobile and painful   PALPATION:  TTP: left supraspinatus, biceps, subscapularis, AC joint, and deltoid   TODAY'S TREATMENT:                                                                                                                                         DATE:                                    08/08/23 EXERCISE LOG  Exercise Repetitions and Resistance Comments  Isometric shoulder flexion  Unable due to pain    Isometric shoulder ADD    Ball roll out  3 minutes Flexion   Pulleys   Flexion   Elbow flexion 15 reps    Therabar bending   Up only   UE ranger (seated)  X 5 mins CW and CCW circles , flex/ext   Blank cell = exercise not performed today  Modalities: no redness or adverse reactions to today's modalities Manual PROM for elevation and ER. Flexion to 90 degrees and ER to 50 degrees. Tried AAROM with flexion and ER, but Pt had a catching pain with flexion  STW to LT shldr posterior cuff and deltoid. Very tender Date:  Hot Pack: Shoulder, 10 mins, Pain  PATIENT EDUCATION: Education details: exercise Person educated: Patient Education method: Explanation Education comprehension: verbalized understanding  HOME EXERCISE PROGRAM:   ASSESSMENT:  CLINICAL IMPRESSION: Patient arrived today doing about the same with LT shldr pain. Rx focused on AAROM and PROM to LT shldr as tolerated. PROM  today LT shldr flexion to 90 degrees and ER to 50 degrees. No ICE or e-stim as per Pt.    OBJECTIVE IMPAIRMENTS: decreased activity tolerance, decreased ROM, decreased strength, impaired tone, impaired UE functional use, and pain.   ACTIVITY LIMITATIONS: carrying, lifting, bathing, toileting, dressing, reach over head, and hygiene/grooming  PARTICIPATION LIMITATIONS: meal prep, cleaning, laundry, driving, shopping, and community activity  PERSONAL FACTORS: Past/current experiences, Time since onset of injury/illness/exacerbation, and 3+ comorbidities: Hypertension, COPD, chronic kidney disease, allergies, and current smoker  are also affecting patient's functional outcome.   REHAB POTENTIAL: Fair    CLINICAL DECISION MAKING: Evolving/moderate complexity  EVALUATION COMPLEXITY: Moderate   GOALS: Goals reviewed with patient? Yes  LONG TERM GOALS: Target date: 08/28/23  Patient will be independent with his HEP. Baseline:  Goal status: INITIAL  2.  Patient will be able to complete his daily activities without his familiar pain exceeding 7/10. Baseline:  Goal status: INITIAL  3.  Patient will be able to demonstrate at least 90 degrees of left shoulder flexion for improved function reaching. Baseline:  Goal status: INITIAL  4.  Patient will be able to carry at least 5 pounds with his left upper extremity for improved function carrying the groceries. Baseline:  Goal status: INITIAL  5.  Patient will be able to demonstrate at least 90 degrees of left shoulder abduction for improved function with household activities. Baseline:  Goal status: INITIAL  PLAN:  PT FREQUENCY: 1-2x/week  PT DURATION: 4 weeks  PLANNED INTERVENTIONS: Therapeutic exercises, Therapeutic activity, Neuromuscular re-education, Patient/Family education, Self Care, Joint mobilization, Electrical stimulation, Cryotherapy, Moist heat, Taping, Vasopneumatic device, Ultrasound, Manual therapy, and  Re-evaluation  PLAN FOR NEXT SESSION: Pulleys, isometrics, manual therapy, and modalities as needed   Artemis Koller,CHRIS, PTA 08/08/2023, 1:57 PM

## 2023-08-12 ENCOUNTER — Ambulatory Visit: Payer: Medicare Other

## 2023-08-12 DIAGNOSIS — M6281 Muscle weakness (generalized): Secondary | ICD-10-CM | POA: Diagnosis not present

## 2023-08-12 DIAGNOSIS — M7502 Adhesive capsulitis of left shoulder: Secondary | ICD-10-CM | POA: Diagnosis not present

## 2023-08-12 DIAGNOSIS — M25612 Stiffness of left shoulder, not elsewhere classified: Secondary | ICD-10-CM

## 2023-08-12 DIAGNOSIS — G8929 Other chronic pain: Secondary | ICD-10-CM

## 2023-08-12 DIAGNOSIS — M25512 Pain in left shoulder: Secondary | ICD-10-CM | POA: Diagnosis not present

## 2023-08-12 DIAGNOSIS — S46012D Strain of muscle(s) and tendon(s) of the rotator cuff of left shoulder, subsequent encounter: Secondary | ICD-10-CM | POA: Diagnosis not present

## 2023-08-12 NOTE — Therapy (Signed)
OUTPATIENT PHYSICAL THERAPY SHOULDER TREATMENT   Patient Name: Todd Hayes MRN: [id number] DOB:Apr 09, 1956, 67 y.o., male Today's Date: 08/12/2023  END OF SESSION:  PT End of Session - 08/12/23 1305     Visit Number 4    Number of Visits 6    Date for PT Re-Evaluation 09/06/23    PT Start Time 1300    PT Stop Time 1332    PT Time Calculation (min) 32 min              Past Medical History:  Diagnosis Date   Allergy    Chronic pain    GERD (gastroesophageal reflux disease)    Hyperlipidemia    Hypertension    Insomnia    Past Surgical History:  Procedure Laterality Date   SHOULDER SURGERY Right    Patient Active Problem List   Diagnosis Date Noted   Shoulder pain, left 05/25/2023   Macrocytosis without anemia 02/10/2021   Hip pain 02/03/2020   CKD (chronic kidney disease) stage 3, GFR 30-59 ml/min (HCC) 06/20/2019   Erectile dysfunction 07/09/2017   COPD (chronic obstructive pulmonary disease) (HCC) 05/24/2015   Lumbar spondylosis with myelopathy 06/23/2014   Tobacco use disorder 04/21/2014   GERD (gastroesophageal reflux disease) 06/25/2013   Primary hypertension 08/08/2011   Hyperlipidemia 08/08/2011   Chronic pain 05/10/2011   Insomnia 05/10/2011   REFERRING PROVIDER: Marisa Cyphers, MD   REFERRING DIAG: Adhesive capsulitis of left shoulder; Traumatic complete tear of left rotator cuff, initial encounter   THERAPY DIAG:  Chronic left shoulder pain  Stiffness of left shoulder, not elsewhere classified  Muscle weakness (generalized)  Rationale for Evaluation and Treatment: Rehabilitation  ONSET DATE: 3 months ago  SUBJECTIVE:                                                                                                                                                                                      SUBJECTIVE STATEMENT: Patient reports that his shoulder is really hurting today and has been hurting bad all weekend. Pt does not feel any  relief.  Pt reports 9/10 left shoulder pain today.   Hand dominance: Right  PERTINENT HISTORY: Hypertension, COPD, chronic kidney disease, allergies, and current smoker  PAIN:  Are you having pain? Yes: NPRS scale: 9/10 Pain location: left shoulder radiating to his elbow Pain description: constant popping and pain  Aggravating factors: moving his arm Relieving factors: topical cream  PRECAUTIONS: Fall  RED FLAGS: None   WEIGHT BEARING RESTRICTIONS: No  FALLS:  Has patient fallen in last 6 months? Yes. Number of falls 1  LIVING ENVIRONMENT: Lives with: lives with their partner Lives in:  House/apartment  OCCUPATION: Disabled  PLOF: Independent  PATIENT GOALS: reduced pain and improved shoulder mobility  NEXT MD VISIT: 08/21/23  OBJECTIVE: alll objective measures were assessed at his initial evaluation on 07/31/23 unless otherwise noted  DIAGNOSTIC FINDINGS: left shoulder MRI 07/13/23 IMPRESSION: 1. Complete tear of the supraspinatus and infraspinatus tendons with 3.1 cm of retraction. 2. Moderate tendinosis of the subscapularis tendon. 3. Mild-moderate tendinosis of the intra-articular portion of the long head of the biceps tendon with a short-segment longitudinal split tear. 4. Mild osteoarthritis of the left glenohumeral joint. 5. Thickening of the inferior joint capsule as can be seen with adhesive capsulitis.  PATIENT SURVEYS:  FOTO 34.25  COGNITION: Overall cognitive status: Within functional limits for tasks assessed     SENSATION: Patient reports no numbness or tingling  POSTURE: Forward head   UPPER EXTREMITY ROM:   Active ROM Right eval Left eval  Shoulder flexion 140 53/ 87 (AAROM)  Shoulder extension    Shoulder abduction 120 57  Shoulder adduction    Shoulder internal rotation To L4  To left gluteals  Shoulder external rotation To T2 Unable to reach left ear   Elbow flexion    Elbow extension    Wrist flexion    Wrist extension     Wrist ulnar deviation    Wrist radial deviation    Wrist pronation    Wrist supination    (Blank rows = not tested)  UPPER EXTREMITY MMT:  MMT Right eval Left eval  Shoulder flexion 4-/5 Unable to assess   Shoulder extension    Shoulder abduction 4-/5 Unable to assess   Shoulder adduction    Shoulder internal rotation    Shoulder external rotation    Middle trapezius    Lower trapezius    Elbow flexion    Elbow extension    Wrist flexion    Wrist extension    Wrist ulnar deviation    Wrist radial deviation    Wrist pronation    Wrist supination    Grip strength (lbs) 50 35  (Blank rows = not tested)  JOINT MOBILITY TESTING:  Left AC joint: hypomobile and painful   PALPATION:  TTP: left supraspinatus, biceps, subscapularis, AC joint, and deltoid   TODAY'S TREATMENT:                                                                                                                                         DATE:                                    08/12/23 EXERCISE LOG  Exercise Repetitions and Resistance Comments  Isometric shoulder flexion  Unable due to pain    Isometric shoulder ADD Unable due to pain   Ball roll out  3 minutes Flexion   Pulleys  4 minutes  Flexion   Elbow flexion 2# x 15 reps    Therabar bending  Green x 2 mins (up only) Up only   UE ranger (seated)  X 5 mins CW and CCW circles , flex/ext   Blank cell = exercise not performed today   Manual Therapy Soft Tissue Mobilization: left shoulder, STW/M to left rotator cuff musculature, deltoid, and bicep to decrease pain and tone.     PATIENT EDUCATION: Education details: exercise Person educated: Patient Education method: Explanation Education comprehension: verbalized understanding  HOME EXERCISE PROGRAM:   ASSESSMENT:  CLINICAL IMPRESSION: Pt arrives for today's treatment session reporting 9/10 left shoulder pain.  Pt denies any improvement in left shoulder pain since beginning therapy.  Pt  reports that all activities involving left shoulder movement increases pain.  Pt able to tolerate increased resistance with therabar and elbow flexion.  STW/M performed to left rotator cuff musculature, deltoid, and bicep to decrease tone.  Pt reports decreased pain while STW being performed, but pain increases back to 9/10 upon completion.      OBJECTIVE IMPAIRMENTS: decreased activity tolerance, decreased ROM, decreased strength, impaired tone, impaired UE functional use, and pain.   ACTIVITY LIMITATIONS: carrying, lifting, bathing, toileting, dressing, reach over head, and hygiene/grooming  PARTICIPATION LIMITATIONS: meal prep, cleaning, laundry, driving, shopping, and community activity  PERSONAL FACTORS: Past/current experiences, Time since onset of injury/illness/exacerbation, and 3+ comorbidities: Hypertension, COPD, chronic kidney disease, allergies, and current smoker  are also affecting patient's functional outcome.   REHAB POTENTIAL: Fair    CLINICAL DECISION MAKING: Evolving/moderate complexity  EVALUATION COMPLEXITY: Moderate   GOALS: Goals reviewed with patient? Yes  LONG TERM GOALS: Target date: 08/28/23  Patient will be independent with his HEP. Baseline:  Goal status: INITIAL  2.  Patient will be able to complete his daily activities without his familiar pain exceeding 7/10. Baseline:  Goal status: INITIAL  3.  Patient will be able to demonstrate at least 90 degrees of left shoulder flexion for improved function reaching. Baseline:  Goal status: INITIAL  4.  Patient will be able to carry at least 5 pounds with his left upper extremity for improved function carrying the groceries. Baseline:  Goal status: INITIAL  5.  Patient will be able to demonstrate at least 90 degrees of left shoulder abduction for improved function with household activities. Baseline:  Goal status: INITIAL  PLAN:  PT FREQUENCY: 1-2x/week  PT DURATION: 4 weeks  PLANNED  INTERVENTIONS: Therapeutic exercises, Therapeutic activity, Neuromuscular re-education, Patient/Family education, Self Care, Joint mobilization, Electrical stimulation, Cryotherapy, Moist heat, Taping, Vasopneumatic device, Ultrasound, Manual therapy, and Re-evaluation  PLAN FOR NEXT SESSION: Pulleys, isometrics, manual therapy, and modalities as needed   Newman Pies, PTA 08/12/2023, 1:35 PM

## 2023-08-15 ENCOUNTER — Ambulatory Visit: Payer: Medicare Other | Attending: Sports Medicine | Admitting: *Deleted

## 2023-08-15 ENCOUNTER — Encounter: Payer: Self-pay | Admitting: *Deleted

## 2023-08-15 DIAGNOSIS — M6281 Muscle weakness (generalized): Secondary | ICD-10-CM | POA: Diagnosis not present

## 2023-08-15 DIAGNOSIS — M25612 Stiffness of left shoulder, not elsewhere classified: Secondary | ICD-10-CM | POA: Diagnosis not present

## 2023-08-15 DIAGNOSIS — G8929 Other chronic pain: Secondary | ICD-10-CM

## 2023-08-15 DIAGNOSIS — M25512 Pain in left shoulder: Secondary | ICD-10-CM | POA: Insufficient documentation

## 2023-08-15 NOTE — Therapy (Signed)
OUTPATIENT PHYSICAL THERAPY SHOULDER TREATMENT   Patient Name: Todd Hayes MRN: [id number] DOB:03-07-1956, 67 y.o., male Today's Date: 08/15/2023  END OF SESSION:  PT End of Session - 08/15/23 1257     Visit Number 5    Number of Visits 6    Date for PT Re-Evaluation 09/06/23    PT Start Time 1300    PT Stop Time 1345    PT Time Calculation (min) 45 min              Past Medical History:  Diagnosis Date   Allergy    Chronic pain    GERD (gastroesophageal reflux disease)    Hyperlipidemia    Hypertension    Insomnia    Past Surgical History:  Procedure Laterality Date   SHOULDER SURGERY Right    Patient Active Problem List   Diagnosis Date Noted   Shoulder pain, left 05/25/2023   Macrocytosis without anemia 02/10/2021   Hip pain 02/03/2020   CKD (chronic kidney disease) stage 3, GFR 30-59 ml/min (HCC) 06/20/2019   Erectile dysfunction 07/09/2017   COPD (chronic obstructive pulmonary disease) (HCC) 05/24/2015   Lumbar spondylosis with myelopathy 06/23/2014   Tobacco use disorder 04/21/2014   GERD (gastroesophageal reflux disease) 06/25/2013   Primary hypertension 08/08/2011   Hyperlipidemia 08/08/2011   Chronic pain 05/10/2011   Insomnia 05/10/2011   REFERRING PROVIDER: Marisa Cyphers, MD   REFERRING DIAG: Adhesive capsulitis of left shoulder; Traumatic complete tear of left rotator cuff, initial encounter   THERAPY DIAG:  Chronic left shoulder pain  Stiffness of left shoulder, not elsewhere classified  Muscle weakness (generalized)  Rationale for Evaluation and Treatment: Rehabilitation  ONSET DATE: 3 months ago  SUBJECTIVE:                                                                                                                                                                                      SUBJECTIVE STATEMENT: Patient reports that his shoulder still hurts. F/u with MD 08-21-23. No better  Hand dominance: Right  PERTINENT  HISTORY: Hypertension, COPD, chronic kidney disease, allergies, and current smoker  PAIN:  Are you having pain? Yes: NPRS scale: 9/10 Pain location: left shoulder radiating to his elbow Pain description: constant popping and pain  Aggravating factors: moving his arm Relieving factors: topical cream  PRECAUTIONS: Fall  RED FLAGS: None   WEIGHT BEARING RESTRICTIONS: No  FALLS:  Has patient fallen in last 6 months? Yes. Number of falls 1  LIVING ENVIRONMENT: Lives with: lives with their partner Lives in: House/apartment  OCCUPATION: Disabled  PLOF: Independent  PATIENT GOALS: reduced pain and improved shoulder mobility  NEXT  MD VISIT: 08/21/23  OBJECTIVE: alll objective measures were assessed at his initial evaluation on 07/31/23 unless otherwise noted  DIAGNOSTIC FINDINGS: left shoulder MRI 07/13/23 IMPRESSION: 1. Complete tear of the supraspinatus and infraspinatus tendons with 3.1 cm of retraction. 2. Moderate tendinosis of the subscapularis tendon. 3. Mild-moderate tendinosis of the intra-articular portion of the long head of the biceps tendon with a short-segment longitudinal split tear. 4. Mild osteoarthritis of the left glenohumeral joint. 5. Thickening of the inferior joint capsule as can be seen with adhesive capsulitis.  PATIENT SURVEYS:  FOTO 34.25  COGNITION: Overall cognitive status: Within functional limits for tasks assessed     SENSATION: Patient reports no numbness or tingling  POSTURE: Forward head   UPPER EXTREMITY ROM:   Active ROM Right eval Left eval  Shoulder flexion 140 53/ 87 (AAROM)  Shoulder extension    Shoulder abduction 120 57  Shoulder adduction    Shoulder internal rotation To L4  To left gluteals  Shoulder external rotation To T2 Unable to reach left ear   Elbow flexion    Elbow extension    Wrist flexion    Wrist extension    Wrist ulnar deviation    Wrist radial deviation    Wrist pronation    Wrist supination     (Blank rows = not tested)  UPPER EXTREMITY MMT:  MMT Right eval Left eval  Shoulder flexion 4-/5 Unable to assess   Shoulder extension    Shoulder abduction 4-/5 Unable to assess   Shoulder adduction    Shoulder internal rotation    Shoulder external rotation    Middle trapezius    Lower trapezius    Elbow flexion    Elbow extension    Wrist flexion    Wrist extension    Wrist ulnar deviation    Wrist radial deviation    Wrist pronation    Wrist supination    Grip strength (lbs) 50 35  (Blank rows = not tested)  JOINT MOBILITY TESTING:  Left AC joint: hypomobile and painful   PALPATION:  TTP: left supraspinatus, biceps, subscapularis, AC joint, and deltoid   TODAY'S TREATMENT:                                                                                                                                         DATE:                                   08-15-23 EXERCISE LOG  Exercise Repetitions and Resistance Comments  Isometric shoulder flexion  Unable due to pain    Isometric shoulder ADD Unable due to pain   Ball roll out  on table 5 minutes    Both Ues AAROM Flexion   Pulleys  4 minutes  Flexion   Elbow flexion  Therabar bending   Up only   UE ranger (seated)  X 5 mins CW and CCW circles , flex/ext   Blank cell = exercise not performed today   Manual Therapy Soft Tissue Mobilization: left shoulder, STW/M to left rotator cuff musculature, deltoid, and bicep to decrease pain and tone.   HMP x 10 mins LT shldr seated  PATIENT EDUCATION: Education details: exercise Person educated: Patient Education method: Explanation Education comprehension: verbalized understanding  HOME EXERCISE PROGRAM:   ASSESSMENT:  CLINICAL IMPRESSION: Pt arrives for today's treatment session reporting 9/10 left shoulder pain still and no f/u with MD until 10 -9-24  Pt denies any improvement in left shoulder pain since beginning therapy.  Pt reports that all activities involving  left shoulder movement increases pain.   STW/M/ TPR performed to left rotator cuff musculature, deltoid, and bicep to decrease tone with notable Tps.  Pt denies wanting ice or estim and just HMP.  1 visit left before MD f/u.      OBJECTIVE IMPAIRMENTS: decreased activity tolerance, decreased ROM, decreased strength, impaired tone, impaired UE functional use, and pain.   ACTIVITY LIMITATIONS: carrying, lifting, bathing, toileting, dressing, reach over head, and hygiene/grooming  PARTICIPATION LIMITATIONS: meal prep, cleaning, laundry, driving, shopping, and community activity  PERSONAL FACTORS: Past/current experiences, Time since onset of injury/illness/exacerbation, and 3+ comorbidities: Hypertension, COPD, chronic kidney disease, allergies, and current smoker  are also affecting patient's functional outcome.   REHAB POTENTIAL: Fair    CLINICAL DECISION MAKING: Evolving/moderate complexity  EVALUATION COMPLEXITY: Moderate   GOALS: Goals reviewed with patient? Yes  LONG TERM GOALS: Target date: 08/28/23  Patient will be independent with his HEP. Baseline:  Goal status: Partially met  2.  Patient will be able to complete his daily activities without his familiar pain exceeding 7/10. Baseline:  Goal status: On going  3.  Patient will be able to demonstrate at least 90 degrees of left shoulder flexion for improved function reaching. Baseline:  Goal status: On going   70 degrees  4.  Patient will be able to carry at least 5 pounds with his left upper extremity for improved function carrying the groceries. Baseline:  Goal status: On going  5.  Patient will be able to demonstrate at least 90 degrees of left shoulder abduction for improved function with household activities. Baseline:  Goal status: On going  PLAN:  PT FREQUENCY: 1-2x/week  PT DURATION: 4 weeks  PLANNED INTERVENTIONS: Therapeutic exercises, Therapeutic activity, Neuromuscular re-education, Patient/Family  education, Self Care, Joint mobilization, Electrical stimulation, Cryotherapy, Moist heat, Taping, Vasopneumatic device, Ultrasound, Manual therapy, and Re-evaluation  PLAN FOR NEXT SESSION: Pulleys, isometrics, manual therapy, and modalities as needed ( no ice or estim)   Ople Girgis,CHRIS, PTA 08/15/2023, 2:20 PM

## 2023-08-19 ENCOUNTER — Ambulatory Visit: Payer: Medicare Other

## 2023-08-19 DIAGNOSIS — M25612 Stiffness of left shoulder, not elsewhere classified: Secondary | ICD-10-CM | POA: Diagnosis not present

## 2023-08-19 DIAGNOSIS — M25512 Pain in left shoulder: Secondary | ICD-10-CM | POA: Diagnosis not present

## 2023-08-19 DIAGNOSIS — M6281 Muscle weakness (generalized): Secondary | ICD-10-CM | POA: Diagnosis not present

## 2023-08-19 DIAGNOSIS — G8929 Other chronic pain: Secondary | ICD-10-CM | POA: Diagnosis not present

## 2023-08-19 NOTE — Therapy (Signed)
OUTPATIENT PHYSICAL THERAPY SHOULDER TREATMENT   Patient Name: Todd Hayes MRN: [id number] DOB:09-15-1956, 67 y.o., male Today's Date: 08/19/2023  END OF SESSION:  PT End of Session - 08/19/23 1300     Visit Number 6    Number of Visits 6    Date for PT Re-Evaluation 09/06/23    PT Start Time 1300    PT Stop Time 1325    PT Time Calculation (min) 25 min    Activity Tolerance Patient tolerated treatment well    Behavior During Therapy [facility] for tasks assessed/performed               Past Medical History:  Diagnosis Date   Allergy    Chronic pain    GERD (gastroesophageal reflux disease)    Hyperlipidemia    Hypertension    Insomnia    Past Surgical History:  Procedure Laterality Date   SHOULDER SURGERY Right    Patient Active Problem List   Diagnosis Date Noted   Shoulder pain, left 05/25/2023   Macrocytosis without anemia 02/10/2021   Hip pain 02/03/2020   CKD (chronic kidney disease) stage 3, GFR 30-59 ml/min (HCC) 06/20/2019   Erectile dysfunction 07/09/2017   COPD (chronic obstructive pulmonary disease) (HCC) 05/24/2015   Lumbar spondylosis with myelopathy 06/23/2014   Tobacco use disorder 04/21/2014   GERD (gastroesophageal reflux disease) 06/25/2013   Primary hypertension 08/08/2011   Hyperlipidemia 08/08/2011   Chronic pain 05/10/2011   Insomnia 05/10/2011   REFERRING PROVIDER: Marisa Cyphers, MD   REFERRING DIAG: Adhesive capsulitis of left shoulder; Traumatic complete tear of left rotator cuff, initial encounter   THERAPY DIAG:  Chronic left shoulder pain  Stiffness of left shoulder, not elsewhere classified  Muscle weakness (generalized)  Rationale for Evaluation and Treatment: Rehabilitation  ONSET DATE: 3 months ago  SUBJECTIVE:                                                                                                                                                                                      SUBJECTIVE  STATEMENT: Patient reports that his shoulder still feels about the same and has not gotten any better.  Hand dominance: Right  PERTINENT HISTORY: Hypertension, COPD, chronic kidney disease, allergies, and current smoker  PAIN:  Are you having pain? Yes: NPRS scale: 9/10 Pain location: left shoulder radiating to his elbow Pain description: constant popping and pain  Aggravating factors: moving his arm Relieving factors: topical cream  PRECAUTIONS: Fall  RED FLAGS: None   WEIGHT BEARING RESTRICTIONS: No  FALLS:  Has patient fallen in last 6 months? Yes. Number of falls 1  LIVING ENVIRONMENT: Lives with: lives  with their partner Lives in: House/apartment  OCCUPATION: Disabled  PLOF: Independent  PATIENT GOALS: reduced pain and improved shoulder mobility  NEXT MD VISIT: 08/21/23  OBJECTIVE: alll objective measures were assessed at his initial evaluation on 07/31/23 unless otherwise noted  DIAGNOSTIC FINDINGS: left shoulder MRI 07/13/23 IMPRESSION: 1. Complete tear of the supraspinatus and infraspinatus tendons with 3.1 cm of retraction. 2. Moderate tendinosis of the subscapularis tendon. 3. Mild-moderate tendinosis of the intra-articular portion of the long head of the biceps tendon with a short-segment longitudinal split tear. 4. Mild osteoarthritis of the left glenohumeral joint. 5. Thickening of the inferior joint capsule as can be seen with adhesive capsulitis.  PATIENT SURVEYS:  FOTO 31.82  COGNITION: Overall cognitive status: Within functional limits for tasks assessed     SENSATION: Patient reports no numbness or tingling  POSTURE: Forward head   UPPER EXTREMITY ROM:   Active ROM Right eval Left eval Left 08/19/23  Shoulder flexion 140 53/ 87 (AAROM) 73  Shoulder extension     Shoulder abduction 120 57 50  Shoulder adduction     Shoulder internal rotation To L4  To left gluteals To left gluteals   Shoulder external rotation To T2 Unable to reach  left ear  Unable to reach left ear   Elbow flexion     Elbow extension     Wrist flexion     Wrist extension     Wrist ulnar deviation     Wrist radial deviation     Wrist pronation     Wrist supination     (Blank rows = not tested)  UPPER EXTREMITY MMT:  MMT Right eval Right 08/19/23 Left eval Left 08/19/23  Shoulder flexion 4-/5  Unable to assess    Shoulder extension      Shoulder abduction 4-/5  Unable to assess    Shoulder adduction      Shoulder internal rotation      Shoulder external rotation      Middle trapezius      Lower trapezius      Elbow flexion      Elbow extension      Wrist flexion      Wrist extension      Wrist ulnar deviation      Wrist radial deviation      Wrist pronation      Wrist supination      Grip strength (lbs) 50 90 35 65  (Blank rows = not tested)  JOINT MOBILITY TESTING:  Left AC joint: hypomobile and painful   PALPATION:  TTP: left supraspinatus, biceps, subscapularis, AC joint, and deltoid   TODAY'S TREATMENT:                                                                                                                                         DATE:  08/19/23 EXERCISE LOG  Exercise Repetitions and Resistance Comments  Pulleys  5 minutes    UE ranger (seated)  3 minutes each  Circles (CW and CCW); LUE only    Blank cell = exercise not performed today                                   08-15-23 EXERCISE LOG  Exercise Repetitions and Resistance Comments  Isometric shoulder flexion  Unable due to pain    Isometric shoulder ADD Unable due to pain   Ball roll out  on table 5 minutes    Both Ues AAROM Flexion   Pulleys  4 minutes  Flexion   Elbow flexion    Therabar bending   Up only   UE ranger (seated)  X 5 mins CW and CCW circles , flex/ext   Blank cell = exercise not performed today   Manual Therapy Soft Tissue Mobilization: left shoulder, STW/M to left rotator cuff musculature, deltoid, and  bicep to decrease pain and tone.   HMP x 10 mins LT shldr seated  PATIENT EDUCATION: Education details: progress with therapy, healing, objective findings, and goals for therapy Person educated: Patient Education method: Explanation Education comprehension: verbalized understanding  HOME EXERCISE PROGRAM:   ASSESSMENT:  CLINICAL IMPRESSION: Patient has made minimal progress with skilled physical therapy as evidenced by his subjective reports, objective measures, functional mobility, and progress toward his goals. He was able to demonstrate a slight improvement in his left shoulder flexion and left hand grip strength since his initial evaluation. However, he continues to exhibit reduced left shoulder mobility which limits his functional mobility. He would benefit from additional medical intervention due to his continued pain severity and irritability which limits his functional mobility.   OBJECTIVE IMPAIRMENTS: decreased activity tolerance, decreased ROM, decreased strength, impaired tone, impaired UE functional use, and pain.   ACTIVITY LIMITATIONS: carrying, lifting, bathing, toileting, dressing, reach over head, and hygiene/grooming  PARTICIPATION LIMITATIONS: meal prep, cleaning, laundry, driving, shopping, and community activity  PERSONAL FACTORS: Past/current experiences, Time since onset of injury/illness/exacerbation, and 3+ comorbidities: Hypertension, COPD, chronic kidney disease, allergies, and current smoker  are also affecting patient's functional outcome.   REHAB POTENTIAL: Fair    CLINICAL DECISION MAKING: Evolving/moderate complexity  EVALUATION COMPLEXITY: Moderate   GOALS: Goals reviewed with patient? Yes  LONG TERM GOALS: Target date: 08/28/23  Patient will be independent with his HEP. Baseline:  Goal status: MET  2.  Patient will be able to complete his daily activities without his familiar pain exceeding 7/10. Baseline:  Goal status: NOT MET  3.   Patient will be able to demonstrate at least 90 degrees of left shoulder flexion for improved function reaching. Baseline:  Goal status: NOT MET   4.  Patient will be able to carry at least 5 pounds with his left upper extremity for improved function carrying the groceries. Baseline:  Goal status: NOT MET  5.  Patient will be able to demonstrate at least 90 degrees of left shoulder abduction for improved function with household activities. Baseline:  Goal status: NOT MET   PLAN:  PT FREQUENCY: 1-2x/week  PT DURATION: 4 weeks  PLANNED INTERVENTIONS: Therapeutic exercises, Therapeutic activity, Neuromuscular re-education, Patient/Family education, Self Care, Joint mobilization, Electrical stimulation, Cryotherapy, Moist heat, Taping, Vasopneumatic device, Ultrasound, Manual therapy, and Re-evaluation  PLAN FOR NEXT SESSION: Pulleys, isometrics, manual therapy, and modalities as needed ( no ice  or estim)   Granville Lewis, PT 08/19/2023, 3:18 PM

## 2023-08-21 ENCOUNTER — Ambulatory Visit: Payer: Medicare Other | Admitting: Sports Medicine

## 2023-08-21 VITALS — BP 127/69 | Ht 72.0 in | Wt 221.0 lb

## 2023-08-21 DIAGNOSIS — M7502 Adhesive capsulitis of left shoulder: Secondary | ICD-10-CM | POA: Diagnosis not present

## 2023-08-21 DIAGNOSIS — S46012D Strain of muscle(s) and tendon(s) of the rotator cuff of left shoulder, subsequent encounter: Secondary | ICD-10-CM | POA: Diagnosis not present

## 2023-08-21 DIAGNOSIS — M19012 Primary osteoarthritis, left shoulder: Secondary | ICD-10-CM | POA: Diagnosis not present

## 2023-08-21 NOTE — Patient Instructions (Signed)
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  We have referred you to [facility] for surgical consultation since you have not made progress with physical therapy and our injection here last month. The referral was to the same orthopedic practice that did your right shoulder surgery in 2018. I recommend continuing the exercises you were doing with physical therapy until your evaluation with the orthopedic surgeons.  Follow-up with [place] as needed.

## 2023-08-21 NOTE — Progress Notes (Unsigned)
PCP: Alfredo Martinez, MD  SUBJECTIVE:   HPI:  Patient is a 67 y.o. male here for 4 week f/u on left shoulder adhesive capsulitis, arthritis, and chronic RTC tear. This has been an ongoing issue for him since a fall in 04/2023. He reports that he has maybe had some mild improvement in ROM with PT, though he has been discharged from PT d/t lack of perceived rehab potential. He is still in a great deal of discomfort and would like to consult with an orthopedic surgeon for surgical repair/possible replacement.   ROS:     See HPI  PERTINENT  PMH / PSH FH / / SH:  Past Medical, Surgical, Social, and Family History Reviewed & Updated in the EMR.  Pertinent findings include:  Chronic Pain, GERD, HTN, HLD Prior right RTC tear s/p repair (2018 - Dr. Darrelyn Hillock at [facility])  Allergies  Allergen Reactions   Advil [Ibuprofen] Other (See Comments)    jerks   Cyclobenzaprine     Other reaction(s): Other States he gives him the jerks   Flexeril [Cyclobenzaprine Hcl]    OBJECTIVE:  BP 127/69   Ht 6' (1.829 m)   Wt 221 lb (100.2 kg)   BMI 29.97 kg/m   PHYSICAL EXAM:  GEN: Alert and Oriented, NAD, comfortable in exam room RESP: Unlabored respirations, symmetric chest rise PSY: normal mood, congruent affect   MSK EXAM: No swelling, ecchymoses.  No gross deformity. Tenderness to palpation at [facility] joint, bicipital groove, lateral shoulder and posterior shoulder. ROM: flex to 60d (improved from 30d previously), abduct to 60d (improved from 30d previously), ER to 30d (improved from 5d previously), IR to iliac crest (unchanged) Did not repeat RTC strength testing. NV intact distally.    ASSESSMENT & PLAN:  1. Adhesive capsulitis of left shoulder 2. Traumatic complete tear of left rotator cuff, subsequent encounter 3. Osteoarthritis of glenohumeral joint, left Patient with 4 months of left shoulder pain with known chronic full-thickness supraspinatus tear and partial-thickness subscapularis and long  head biceps tendon tear in addition to adhesive capsulitis and GH arthritis. He has had some improvement in ROM with PT and s/p GH injection, however is frustrated with his progress and persistence of pain. He would like to consult with orthopedic surgery regarding repair or potential replacement. Referral placed today.    Glean Salen, MD PGY-4, Sports Medicine Fellow [facility]  Addendum:  Patient seen in the office by fellow.  His history, exam, plan of care were precepted with me.  Norton Blizzard MD Marrianne Mood

## 2023-09-02 ENCOUNTER — Other Ambulatory Visit: Payer: Self-pay | Admitting: Student

## 2023-09-02 DIAGNOSIS — M4716 Other spondylosis with myelopathy, lumbar region: Secondary | ICD-10-CM

## 2023-09-02 DIAGNOSIS — M25512 Pain in left shoulder: Secondary | ICD-10-CM | POA: Diagnosis not present

## 2023-09-04 DIAGNOSIS — M75122 Complete rotator cuff tear or rupture of left shoulder, not specified as traumatic: Secondary | ICD-10-CM | POA: Diagnosis not present

## 2023-09-04 DIAGNOSIS — M25812 Other specified joint disorders, left shoulder: Secondary | ICD-10-CM | POA: Diagnosis not present

## 2023-09-10 ENCOUNTER — Telehealth: Payer: Self-pay

## 2023-09-10 NOTE — Telephone Encounter (Signed)
Contacted the patient to try and schedule appt but he stated that before he can schedule he have to confirm if he's going to have transportation. I informed that patient that I will contact him later to schedule once he finds out.

## 2023-09-10 NOTE — Telephone Encounter (Signed)
-----   Message from [employer] sent at 09/09/2023  9:49 PM EDT ----- Can we call this patient for medical pre op clearance visit?

## 2023-09-10 NOTE — Telephone Encounter (Signed)
Patient have an appt 11/4 @1030 

## 2023-09-16 ENCOUNTER — Ambulatory Visit
Admission: RE | Admit: 2023-09-16 | Discharge: 2023-09-16 | Disposition: A | Discharge status: Home / Self Care | Payer: Medicare Other | Source: Ambulatory Visit | Attending: Family Medicine | Admitting: Family Medicine

## 2023-09-16 ENCOUNTER — Ambulatory Visit (INDEPENDENT_AMBULATORY_CARE_PROVIDER_SITE_OTHER): Payer: Medicare Other | Admitting: Student

## 2023-09-16 ENCOUNTER — Ambulatory Visit (HOSPITAL_COMMUNITY)
Admission: RE | Admit: 2023-09-16 | Discharge: 2023-09-16 | Disposition: A | Discharge status: Home / Self Care | Payer: Medicare Other | Source: Ambulatory Visit | Attending: Family Medicine | Admitting: Family Medicine

## 2023-09-16 ENCOUNTER — Encounter: Payer: Self-pay | Admitting: Student

## 2023-09-16 VITALS — BP 137/69 | HR 68 | Ht 72.0 in | Wt 221.6 lb

## 2023-09-16 DIAGNOSIS — Z01818 Encounter for other preprocedural examination: Secondary | ICD-10-CM | POA: Diagnosis not present

## 2023-09-16 DIAGNOSIS — R9431 Abnormal electrocardiogram [ECG] [EKG]: Secondary | ICD-10-CM | POA: Diagnosis not present

## 2023-09-16 DIAGNOSIS — Z01810 Encounter for preprocedural cardiovascular examination: Secondary | ICD-10-CM | POA: Diagnosis not present

## 2023-09-16 NOTE — Patient Instructions (Addendum)
It was great to see you today! Thank you for choosing [facility] for your primary care.  Today we addressed: We will check EKG and chest xray  As we discussed  - Hold Meloxicam 7 days prior to surgery  - Trazodone can be continued  - Ropinirole can be continued  - Continue Protonix  - Hold lisinopril 24-48 hours before  - Can continue baclofen  - Goody powders in morning--would recommend discontinuing these routinely, and would not recommend as needed unless severe pain   3. Follow up with anesthesia prior to surgery for further assistance  4. Chest xray, please obtain at [address]  5. After these are completed, will send to Ortho  If you haven't already, sign up for My Chart to have easy access to your labs results, and communication with your primary care physician. I recommend that you always bring your medications to each appointment as this makes it easy to ensure you are on the correct medications and helps [place] not miss refills when you need them. Call the clinic at [phone] if your symptoms worsen or you have any concerns. Return in about 3 months (around 12/17/2023). Please arrive 15 minutes before your appointment to ensure smooth check in process.  We appreciate your efforts in making this happen.  Thank you for allowing me to participate in your care, Alfredo Martinez, MD 09/16/2023, 11:27 AM PGY-3, [facility]

## 2023-09-16 NOTE — Progress Notes (Signed)
  Patient Name: DEEGAN VALENTINO Date of Birth: May 26, 1956 Date of Visit: 09/16/23 PCP: Alfredo Martinez, MD  Chief Complaint: preoperative evaluation Surgeon:  Dr. Ranell Patrick Surgery: RCT repair left  Date of Procedure: TBD   Subjective: Kekai Geter Lasecki is a pleasant 67 y.o. with medical history significant for HTN, COPD, GERD, CKD--Last Cr 1.22, Chronic Pain, ED, HLD, insomnia, tobacco use presenting today for preop evaluation.   The patient can walk up 2 flights of stairs without difficulty. Can walk up stairs 9 times a day  Prior surgeries: Right shoulder surgery in past x2 Little toe surgery   Review of systems: Chest pain with exertion? No Dyspnea on exertion? No - Has COPD, less than one PPD since 67 years old  Can you walk 2 blocks without dyspnea or chest pain? Yes Can you walk up a flight of stairs without chest pain or dyspnea? Yes Any history of easy bruising or bleeding? No Any family history of bleeding diathesis? No Any personal or family of difficulty with anesthesia? No Any history of sleep apnea? No  Medical History: Cardiac conditions? No History of VTE? No History of adverse surgical outcome? No  Diabetes? No Obesity? Yes OSA? No    I have reviewed the patient's medical, surgical, family, and social history as appropriate.   Vitals:   09/16/23 1046  BP: 137/69  Pulse: 68  SpO2: 98%   Filed Weights   09/16/23 1046  Weight: 221 lb 9.6 oz (100.5 kg)   General: Alert and oriented in no apparent distress Heart: Regular rate and rhythm with no murmurs appreciated Lungs: CTA bilaterally, no wheezing Abdomen: Bowel sounds present, no abdominal pain Skin: Warm and dry Extremities: No lower extremity edema   Assessment & Plan Preoperative clearance Patient is 0.2-0.6% risk per Chales Abrahams risk calculator.  I make the following recommendations for further evaluation and medication adjustments prior to their  planned procedure:  - Hold NSAIDs (Meloxicam) 7 days  prior to surgery  - Trazodone can be continued  - Ropinirole can be continued  - Continue Protonix  - Hold lisinopril 24-48 hours before  - Can continue baclofen  - Goody powders in morning--would recommend discontinuing those permanently  Will obtain EKG and CXR for pre-op evaluation given COPD   Patient declined lung cancer screening, reporting that he does not have lung cancer. Will continue approaching this topic.   EKG reviewed with preceptor--nonspecific ST wave changes, artifact, NSR  Alfredo Martinez, MD  Family Medicine Teaching Service

## 2023-09-16 NOTE — Assessment & Plan Note (Addendum)
Patient is 0.2-0.6% risk per Chales Abrahams risk calculator.  I make the following recommendations for further evaluation and medication adjustments prior to their  planned procedure:  - Hold NSAIDs (Meloxicam) 7 days prior to surgery  - Trazodone can be continued  - Ropinirole can be continued  - Continue Protonix  - Hold lisinopril 24-48 hours before  - Can continue baclofen  - Goody powders in morning--would recommend discontinuing those permanently  Will obtain EKG and CXR for pre-op evaluation given COPD   Patient declined lung cancer screening, reporting that he does not have lung cancer. Will continue approaching this topic.   EKG reviewed with preceptor--nonspecific ST wave changes, artifact, NSR

## 2023-10-07 ENCOUNTER — Encounter: Payer: Self-pay | Admitting: Student

## 2023-10-09 ENCOUNTER — Other Ambulatory Visit: Payer: Self-pay | Admitting: Student

## 2023-10-09 DIAGNOSIS — M4716 Other spondylosis with myelopathy, lumbar region: Secondary | ICD-10-CM

## 2023-10-23 DIAGNOSIS — Z03818 Encounter for observation for suspected exposure to other biological agents ruled out: Secondary | ICD-10-CM | POA: Diagnosis not present

## 2023-10-23 DIAGNOSIS — J069 Acute upper respiratory infection, unspecified: Secondary | ICD-10-CM | POA: Diagnosis not present

## 2023-10-23 DIAGNOSIS — R051 Acute cough: Secondary | ICD-10-CM | POA: Diagnosis not present

## 2023-11-06 ENCOUNTER — Other Ambulatory Visit: Payer: Self-pay | Admitting: Student

## 2023-11-06 DIAGNOSIS — I10 Essential (primary) hypertension: Secondary | ICD-10-CM

## 2023-11-07 ENCOUNTER — Other Ambulatory Visit: Payer: Self-pay | Admitting: Student

## 2023-11-07 DIAGNOSIS — M4716 Other spondylosis with myelopathy, lumbar region: Secondary | ICD-10-CM

## 2023-11-12 ENCOUNTER — Other Ambulatory Visit: Payer: Self-pay | Admitting: Student

## 2023-11-12 DIAGNOSIS — G4709 Other insomnia: Secondary | ICD-10-CM

## 2023-11-25 NOTE — H&P (Signed)
 Patient's anticipated LOS is less than 2 midnights, meeting these requirements: - Younger than 54 - Lives within 1 hour of care - Has a competent adult at home to recover with post-op recover - NO history of  - Chronic pain requiring opiods  - Diabetes  - Coronary Artery Disease  - Heart failure  - Heart attack  - Stroke  - DVT/VTE  - Cardiac arrhythmia  - Respiratory Failure/COPD  - Renal failure  - Anemia  - Advanced Liver disease     Todd Hayes is an 68 y.o. male.    Chief Complaint: left shoulder pain  HPI: Pt is a 68 y.o. male complaining of left shoulder pain for multiple years. Pain had continually increased since the beginning. X-rays in the clinic show end-stage arthritic changes of the left shoulder. Pt has tried various conservative treatments which have failed to alleviate their symptoms, including injections and therapy. Various options are discussed with the patient. Risks, benefits and expectations were discussed with the patient. Patient understand the risks, benefits and expectations and wishes to proceed with surgery.   PCP:  [name], MD  D/C Plans: Home  PMH: Past Medical History:  Diagnosis Date   Allergy    Chronic pain    GERD (gastroesophageal reflux disease)    Hyperlipidemia    Hypertension    Insomnia     PSH: Past Surgical History:  Procedure Laterality Date   SHOULDER SURGERY Right     Social History:  reports that he has been smoking cigarettes. He has never used smokeless tobacco. He reports current alcohol use. No history on file for drug use. BMI: There is no height or weight on file to calculate BMI.  Lab Results  Component Value Date   ALBUMIN 4.2 06/09/2019   Diabetes: Patient does not have a diagnosis of diabetes. Lab Results  Component Value Date   HGBA1C 5.5 05/20/2023     Smoking Status: Social History   Tobacco Use  Smoking Status Every Day   Current packs/day: 1.00   Types: Cigarettes  Smokeless  Tobacco Never   Ready to quit: Not Answered Counseling given: Not Answered  The patient has participated in a 4-week cessation program.          Allergies:  Allergies  Allergen Reactions   Advil  [Ibuprofen ] Other (See Comments)    jerks   Cyclobenzaprine     Other reaction(s): Other States he gives him the jerks   Flexeril [Cyclobenzaprine Hcl]     Medications: No current facility-administered medications for this encounter.   Current Outpatient Medications  Medication Sig Dispense Refill   baclofen  (LIORESAL ) 10 MG tablet TAKE 1 TABLET BY MOUTH ONCE OR TWICE DAILY AS NEEDED FOR MUSCLE SPASMS 180 tablet 1   fluticasone  (FLONASE ) 50 MCG/ACT nasal spray INSTILL 1 SPRAY INTO BOTH NOSTRILS DAILY 16 mL 11   lisinopril  (ZESTRIL ) 10 MG tablet TAKE 1 TABLET BY MOUTH EVERY DAY 90 tablet 0   meloxicam  (MOBIC ) 7.5 MG tablet TAKE 1 TABLET BY MOUTH DAILY AS NEEDED. 30 tablet 0   omega-3 acid ethyl esters (LOVAZA ) 1 g capsule Take 2 capsules (2 g total) by mouth 2 (two) times daily. 120 capsule 5   pantoprazole  (PROTONIX ) 20 MG tablet TAKE 1 TABLET BY MOUTH EVERY DAY 90 tablet 0   rOPINIRole  (REQUIP ) 0.25 MG tablet TAKE 2 TABLETS (0.5 MG TOTAL) BY MOUTH AT BEDTIME.FILL 09/18/18 180 tablet 0   traZODone  (DESYREL ) 150 MG tablet TAKE 2 TABLETS BY  MOUTH AT BEDTIME 180 tablet 1    No results found for this or any previous visit (from the past 48 hours). No results found.  ROS: Pain with rom of the left upper extremity  Physical Exam: Alert and oriented 68 y.o. male in no acute distress Cranial nerves 2-12 intact Cervical spine: full rom with no tenderness, nv intact distally Chest: active breath sounds bilaterally, no wheeze rhonchi or rales Heart: regular rate and rhythm, no murmur Abd: non tender non distended with active bowel sounds Hip is stable with rom  Left shoulder painful and weak rom Nv intact distally  Assessment/Plan Assessment: left shoulder cuff  arthropathy  Plan:  Patient will undergo a left reverse total shoulder by Dr. Kay at [facility] Risks benefits and expectations were discussed with the patient. Patient understand risks, benefits and expectations and wishes to proceed. Preoperative templating of the joint replacement has been completed, documented, and submitted to the Operating Room personnel in order to optimize intra-operative equipment management.   Arvella Fireman PA-C, MPAS [facility] is now [employer] [address], [address], [place], [place] [address] Phone: [phone] [email or web address] Facebook  [employer]

## 2023-11-27 ENCOUNTER — Other Ambulatory Visit: Payer: Self-pay | Admitting: Student

## 2023-11-27 DIAGNOSIS — G894 Chronic pain syndrome: Secondary | ICD-10-CM

## 2023-11-27 DIAGNOSIS — M4716 Other spondylosis with myelopathy, lumbar region: Secondary | ICD-10-CM

## 2023-11-30 ENCOUNTER — Other Ambulatory Visit: Payer: Self-pay | Admitting: Student

## 2023-11-30 DIAGNOSIS — G2581 Restless legs syndrome: Secondary | ICD-10-CM

## 2023-11-30 DIAGNOSIS — K219 Gastro-esophageal reflux disease without esophagitis: Secondary | ICD-10-CM

## 2023-12-02 NOTE — Patient Instructions (Signed)
DUE TO COVID-19 ONLY TWO VISITORS  (aged 67 and older)  ARE ALLOWED TO COME WITH YOU AND STAY IN THE WAITING ROOM ONLY DURING PRE OP AND PROCEDURE.   **NO VISITORS ARE ALLOWED IN THE SHORT STAY AREA OR RECOVERY ROOM!!**  IF YOU WILL BE ADMITTED INTO THE HOSPITAL YOU ARE ALLOWED ONLY FOUR SUPPORT PEOPLE DURING VISITATION HOURS ONLY (7 AM -8PM)   The support person(s) must pass our screening, gel in and out, and wear a mask at all times, including in the patient's room. Patients must also wear a mask when staff or their support person are in the room. Visitors GUEST BADGE MUST BE WORN VISIBLY  One adult visitor may remain with you overnight and MUST be in the room by 8 P.M.     Your procedure is scheduled on: 12/13/23   Report [facility] Main Entrance    Report to admitting at : 9:30 AM   Call this number if you have problems the morning of surgery [phone]   Do not eat food :After Midnight.   After Midnight you may have the following liquids until : 9:00 AM DAY OF SURGERY  Water Black Coffee (sugar ok, NO MILK/CREAM OR CREAMERS)  Tea (sugar ok, NO MILK/CREAM OR CREAMERS) regular and decaf                             Plain Jell-O (NO RED)                                           Fruit ices (not with fruit pulp, NO RED)                                     Popsicles (NO RED)                                                                  Juice: apple, WHITE grape, WHITE cranberry Sports drinks like Gatorade (NO RED)   The day of surgery:  Drink ONE (1) Pre-Surgery Clear Ensure at : 9:00 AM the morning of surgery. Drink in one sitting. Do not sip.  This drink was given to you during your hospital  pre-op appointment visit. Nothing else to drink after completing the  Pre-Surgery Clear Ensure or G2.          If you have questions, please contact your surgeon's office.  FOLLOW ANY ADDITIONAL PRE OP INSTRUCTIONS YOU RECEIVED FROM YOUR SURGEON'S OFFICE!!!   Oral  Hygiene is also important to reduce your risk of infection.                                    Remember - BRUSH YOUR TEETH THE MORNING OF SURGERY WITH YOUR REGULAR TOOTHPASTE  DENTURES WILL BE REMOVED PRIOR TO SURGERY PLEASE DO NOT APPLY "Poly grip" OR ADHESIVES!!!   Do NOT smoke after Midnight   Take these medicines the morning of surgery  with A SIP OF WATER: pantoprazole.  Bring CPAP mask and tubing day of surgery.                              You may not have any metal on your body including hair pins, jewelry, and body piercing             Do not wear lotions, powders, perfumes/cologne, or deodorant              Men may shave face and neck.   Do not bring valuables to the hospital. [facility] IS NOT             RESPONSIBLE   FOR VALUABLES.   Contacts, glasses, or bridgework may not be worn into surgery.   Bring small overnight bag day of surgery.   DO NOT BRING YOUR HOME MEDICATIONS TO THE HOSPITAL. PHARMACY WILL DISPENSE MEDICATIONS LISTED ON YOUR MEDICATION LIST TO YOU DURING YOUR ADMISSION IN THE HOSPITAL!    Patients discharged on the day of surgery will not be allowed to drive home.  Someone NEEDS to stay with you for the first 24 hours after anesthesia.   Special Instructions: Bring a copy of your healthcare power of attorney and living will documents         the day of surgery if you haven't scanned them before.              Please read over the following fact sheets you were given: IF YOU HAVE QUESTIONS ABOUT YOUR PRE-OP INSTRUCTIONS PLEASE CALL [phone]    [facility]- Preparing for Total Shoulder Arthroplasty    Before surgery, you can play an important role. Because skin is not sterile, your skin needs to be as free of germs as possible. You can reduce the number of germs on your skin by using the following products. Benzoyl Peroxide Gel Reduces the number of germs present on the skin Applied twice a day to shoulder area starting two days before surgery     ==================================================================  Please follow these instructions carefully:  BENZOYL PEROXIDE 5% GEL  Please do not use if you have an allergy to benzoyl peroxide.   If your skin becomes reddened/irritated stop using the benzoyl peroxide.  Starting two days before surgery, apply as follows: Apply benzoyl peroxide in the morning and at night. Apply after taking a shower. If you are not taking a shower clean entire shoulder front, back, and side along with the armpit with a clean wet washcloth.  Place a quarter-sized dollop on your shoulder and rub in thoroughly, making sure to cover the front, back, and side of your shoulder, along with the armpit.   2 days before ____ AM   ____ PM              1 day before ____ AM   ____ PM                         Do this twice a day for two days.  (Last application is the night before surgery, AFTER using the CHG soap as described below).  Do NOT apply benzoyl peroxide gel on the day of surgery.  Pre-operative 5 CHG Bath Instructions   You can play a key role in reducing the risk of infection after surgery. Your skin needs to be as free of germs as possible. You can reduce the number  of germs on your skin by washing with CHG (chlorhexidine gluconate) soap before surgery. CHG is an antiseptic soap that kills germs and continues to kill germs even after washing.   DO NOT use if you have an allergy to chlorhexidine/CHG or antibacterial soaps. If your skin becomes reddened or irritated, stop using the CHG and notify one of our RNs at : [phone].   Please shower with the CHG soap starting 4 days before surgery using the following schedule:     Please keep in mind the following:  DO NOT shave, including legs and underarms, starting the day of your first shower.   You may shave your face at any point before/day of surgery.  Place clean sheets on your bed the day you start using CHG soap. Use a clean washcloth  (not used since being washed) for each shower. DO NOT sleep with pets once you start using the CHG.   CHG Shower Instructions:  If you choose to wash your hair and private area, wash first with your normal shampoo/soap.  After you use shampoo/soap, rinse your hair and body thoroughly to remove shampoo/soap residue.  Turn the water OFF and apply about 3 tablespoons (45 ml) of CHG soap to a CLEAN washcloth.  Apply CHG soap ONLY FROM YOUR NECK DOWN TO YOUR TOES (washing for 3-5 minutes)  DO NOT use CHG soap on face, private areas, open wounds, or sores.  Pay special attention to the area where your surgery is being performed.  If you are having back surgery, having someone wash your back for you may be helpful. Wait 2 minutes after CHG soap is applied, then you may rinse off the CHG soap.  Pat dry with a clean towel  Put on clean clothes/pajamas   If you choose to wear lotion, please use ONLY the CHG-compatible lotions on the back of this paper.     Additional instructions for the day of surgery: DO NOT APPLY any lotions, deodorants, cologne, or perfumes.   Put on clean/comfortable clothes.  Brush your teeth.  Ask your nurse before applying any prescription medications to the skin.   CHG Compatible Lotions   Aveeno Moisturizing lotion  Cetaphil Moisturizing Cream  Cetaphil Moisturizing Lotion  Clairol Herbal Essence Moisturizing Lotion, Dry Skin  Clairol Herbal Essence Moisturizing Lotion, Extra Dry Skin  Clairol Herbal Essence Moisturizing Lotion, Normal Skin  Curel Age Defying Therapeutic Moisturizing Lotion with Alpha Hydroxy  Curel Extreme Care Body Lotion  Curel Soothing Hands Moisturizing Hand Lotion  Curel Therapeutic Moisturizing Cream, Fragrance-Free  Curel Therapeutic Moisturizing Lotion, Fragrance-Free  Curel Therapeutic Moisturizing Lotion, Original Formula  Eucerin Daily Replenishing Lotion  Eucerin Dry Skin Therapy Plus Alpha Hydroxy Crme  Eucerin Dry Skin Therapy  Plus Alpha Hydroxy Lotion  Eucerin Original Crme  Eucerin Original Lotion  Eucerin Plus Crme Eucerin Plus Lotion  Eucerin TriLipid Replenishing Lotion  Keri Anti-Bacterial Hand Lotion  Keri Deep Conditioning Original Lotion Dry Skin Formula Softly Scented  Keri Deep Conditioning Original Lotion, Fragrance Free Sensitive Skin Formula  Keri Lotion Fast Absorbing Fragrance Free Sensitive Skin Formula  Keri Lotion Fast Absorbing Softly Scented Dry Skin Formula  Keri Original Lotion  Keri Skin Renewal Lotion Keri Silky Smooth Lotion  Keri Silky Smooth Sensitive Skin Lotion  Nivea Body Creamy Conditioning Oil  Nivea Body Extra Enriched Teacher, adult education Moisturizing Lotion Nivea Crme  Nivea Skin Firming Lotion  NutraDerm 30 Skin Lotion  NutraDerm Skin  Lotion  NutraDerm Therapeutic Skin Cream  NutraDerm Therapeutic Skin Lotion  ProShield Protective Hand Cream  Provon moisturizing lotion   Incentive Spirometer  An incentive spirometer is a tool that can help keep your lungs clear and active. This tool measures how well you are filling your lungs with each breath. Taking long deep breaths may help reverse or decrease the chance of developing breathing (pulmonary) problems (especially infection) following: A long period of time when you are unable to move or be active. BEFORE THE PROCEDURE  If the spirometer includes an indicator to show your best effort, your nurse or respiratory therapist will set it to a desired goal. If possible, sit up straight or lean slightly forward. Try not to slouch. Hold the incentive spirometer in an upright position. INSTRUCTIONS FOR USE  Sit on the edge of your bed if possible, or sit up as far as you can in bed or on a chair. Hold the incentive spirometer in an upright position. Breathe out normally. Place the mouthpiece in your mouth and seal your lips tightly around it. Breathe in slowly and as deeply as possible,  raising the piston or the ball toward the top of the column. Hold your breath for 3-5 seconds or for as long as possible. Allow the piston or ball to fall to the bottom of the column. Remove the mouthpiece from your mouth and breathe out normally. Rest for a few seconds and repeat Steps 1 through 7 at least 10 times every 1-2 hours when you are awake. Take your time and take a few normal breaths between deep breaths. The spirometer may include an indicator to show your best effort. Use the indicator as a goal to work toward during each repetition. After each set of 10 deep breaths, practice coughing to be sure your lungs are clear. If you have an incision (the cut made at the time of surgery), support your incision when coughing by placing a pillow or rolled up towels firmly against it. Once you are able to get out of bed, walk around indoors and cough well. You may stop using the incentive spirometer when instructed by your caregiver.  RISKS AND COMPLICATIONS Take your time so you do not get dizzy or light-headed. If you are in pain, you may need to take or ask for pain medication before doing incentive spirometry. It is harder to take a deep breath if you are having pain. AFTER USE Rest and breathe slowly and easily. It can be helpful to keep track of a log of your progress. Your caregiver can provide you with a simple table to help with this. If you are using the spirometer at home, follow these instructions: SEEK MEDICAL CARE IF:  You are having difficultly using the spirometer. You have trouble using the spirometer as often as instructed. Your pain medication is not giving enough relief while using the spirometer. You develop fever of 100.5 F (38.1 C) or higher. SEEK IMMEDIATE MEDICAL CARE IF:  You cough up bloody sputum that had not been present before. You develop fever of 102 F (38.9 C) or greater. You develop worsening pain at or near the incision site. MAKE SURE YOU:  Understand  these instructions. Will watch your condition. Will get help right away if you are not doing well or get worse. Document Released: 03/11/2007 Document Revised: 01/21/2012 Document Reviewed: 05/12/2007 [facility] Patient Information 2014 [place], [place].   ________________________________________________________________________

## 2023-12-03 ENCOUNTER — Encounter (HOSPITAL_COMMUNITY): Payer: Self-pay

## 2023-12-03 ENCOUNTER — Encounter (HOSPITAL_COMMUNITY)
Admission: RE | Admit: 2023-12-03 | Discharge: 2023-12-03 | Disposition: A | Discharge status: Home / Self Care | Payer: Medicare Other | Source: Ambulatory Visit | Attending: Orthopedic Surgery

## 2023-12-03 ENCOUNTER — Other Ambulatory Visit: Payer: Self-pay

## 2023-12-03 VITALS — BP 155/85 | HR 91 | Temp 98.3°F | Ht 72.0 in | Wt 218.0 lb

## 2023-12-03 DIAGNOSIS — Z01812 Encounter for preprocedural laboratory examination: Secondary | ICD-10-CM | POA: Diagnosis not present

## 2023-12-03 DIAGNOSIS — I10 Essential (primary) hypertension: Secondary | ICD-10-CM | POA: Diagnosis not present

## 2023-12-03 DIAGNOSIS — Z01818 Encounter for other preprocedural examination: Secondary | ICD-10-CM

## 2023-12-03 HISTORY — DX: Pneumonia, unspecified organism: J18.9

## 2023-12-03 HISTORY — DX: Dyspnea, unspecified: R06.00

## 2023-12-03 HISTORY — DX: Unspecified osteoarthritis, unspecified site: M19.90

## 2023-12-03 HISTORY — DX: Chronic obstructive pulmonary disease, unspecified: J44.9

## 2023-12-03 LAB — CBC
HCT: 40.5 % (ref 39.0–52.0)
Hemoglobin: 14 g/dL (ref 13.0–17.0)
MCH: 35 pg — ABNORMAL HIGH (ref 26.0–34.0)
MCHC: 34.6 g/dL (ref 30.0–36.0)
MCV: 101.3 fL — ABNORMAL HIGH (ref 80.0–100.0)
Platelets: 125 10*3/uL — ABNORMAL LOW (ref 150–400)
RBC: 4 MIL/uL — ABNORMAL LOW (ref 4.22–5.81)
RDW: 13.6 % (ref 11.5–15.5)
WBC: 6.1 10*3/uL (ref 4.0–10.5)
nRBC: 0 % (ref 0.0–0.2)

## 2023-12-03 LAB — COMPREHENSIVE METABOLIC PANEL
ALT: 15 U/L (ref 0–44)
AST: 14 U/L — ABNORMAL LOW (ref 15–41)
Albumin: 3.8 g/dL (ref 3.5–5.0)
Alkaline Phosphatase: 74 U/L (ref 38–126)
Anion gap: 9 (ref 5–15)
BUN: 20 mg/dL (ref 8–23)
CO2: 27 mmol/L (ref 22–32)
Calcium: 9.1 mg/dL (ref 8.9–10.3)
Chloride: 100 mmol/L (ref 98–111)
Creatinine, Ser: 1.33 mg/dL — ABNORMAL HIGH (ref 0.61–1.24)
GFR, Estimated: 59 mL/min — ABNORMAL LOW (ref >60)
Glucose, Bld: 102 mg/dL — ABNORMAL HIGH (ref 70–99)
Potassium: 5.3 mmol/L — ABNORMAL HIGH (ref 3.5–5.1)
Sodium: 136 mmol/L (ref 135–145)
Total Bilirubin: 0.5 mg/dL (ref 0.0–1.2)
Total Protein: 7 g/dL (ref 6.5–8.1)

## 2023-12-03 LAB — SURGICAL PCR SCREEN
MRSA, PCR: NEGATIVE
Staphylococcus aureus: NEGATIVE

## 2023-12-03 NOTE — Progress Notes (Signed)
For Anesthesia: PCP - Alfredo Martinez, MD . LOV: 09/16/23 Cardiologist - N/A  Bowel Prep reminder:  Chest x-ray - 10/05/23 EKG - 09/16/23 Stress Test -  ECHO -  Cardiac Cath -  Pacemaker/ICD device last checked: Pacemaker orders received: Device Rep notified:  Spinal Cord Stimulator:N/A  Sleep Study - N/A CPAP -   Fasting Blood Sugar - N/A Checks Blood Sugar _____ times a day Date and result of last Hgb A1c-  Last dose of GLP1 agonist- N/A GLP1 instructions:   Last dose of SGLT-2 inhibitors- N/A SGLT-2 instructions:   Blood Thinner Instructions:N/A Aspirin Instructions: Last Dose:  Activity level: Can go up a flight of stairs and activities of daily living without stopping and without chest pain and/or shortness of breath   Able to exercise without chest pain and/or shortness of breath  Anesthesia review: Hx: COPD,CKD 3,Smoker,HTN  Patient denies shortness of breath, fever, cough and chest pain at PAT appointment   Patient verbalized understanding of instructions that were given to them at the PAT appointment. Patient was also instructed that they will need to review over the PAT instructions again at home before surgery.

## 2023-12-03 NOTE — Progress Notes (Signed)
   12/03/23 1333  OBSTRUCTIVE SLEEP APNEA  Have you ever been diagnosed with sleep apnea through a sleep study? No  Do you snore loudly (loud enough to be heard through closed doors)?  1  Do you often feel tired, fatigued, or sleepy during the daytime (such as falling asleep during driving or talking to someone)? 0  Has anyone observed you stop breathing during your sleep? 0  Do you have, or are you being treated for high blood pressure? 1  BMI more than 35 kg/m2? 1  Age > 50 (1-yes) 1  Neck circumference greater than:Male 16 inches or larger, Male 17inches or larger? 0  Male Gender (Yes=1) 1  Obstructive Sleep Apnea Score 5  Score 5 or greater  Results sent to PCP

## 2023-12-13 ENCOUNTER — Encounter (HOSPITAL_COMMUNITY)
Admission: RE | Disposition: A | Discharge status: Home / Self Care | Payer: Self-pay | Source: Home / Self Care | Attending: Orthopedic Surgery

## 2023-12-13 ENCOUNTER — Ambulatory Visit (HOSPITAL_COMMUNITY): Payer: Medicare Other | Admitting: Anesthesiology

## 2023-12-13 ENCOUNTER — Encounter (HOSPITAL_COMMUNITY): Payer: Self-pay | Admitting: Orthopedic Surgery

## 2023-12-13 ENCOUNTER — Ambulatory Visit (HOSPITAL_COMMUNITY): Payer: Medicare Other | Admitting: Physician Assistant

## 2023-12-13 ENCOUNTER — Ambulatory Visit (HOSPITAL_COMMUNITY): Payer: Medicare Other

## 2023-12-13 ENCOUNTER — Ambulatory Visit (HOSPITAL_COMMUNITY)
Admission: RE | Admit: 2023-12-13 | Discharge: 2023-12-13 | Disposition: A | Discharge status: Home / Self Care | Payer: Medicare Other | Attending: Orthopedic Surgery | Admitting: Orthopedic Surgery

## 2023-12-13 ENCOUNTER — Other Ambulatory Visit: Payer: Self-pay

## 2023-12-13 DIAGNOSIS — Z79899 Other long term (current) drug therapy: Secondary | ICD-10-CM | POA: Diagnosis not present

## 2023-12-13 DIAGNOSIS — J449 Chronic obstructive pulmonary disease, unspecified: Secondary | ICD-10-CM | POA: Diagnosis not present

## 2023-12-13 DIAGNOSIS — I10 Essential (primary) hypertension: Secondary | ICD-10-CM | POA: Diagnosis not present

## 2023-12-13 DIAGNOSIS — M4716 Other spondylosis with myelopathy, lumbar region: Secondary | ICD-10-CM

## 2023-12-13 DIAGNOSIS — M75122 Complete rotator cuff tear or rupture of left shoulder, not specified as traumatic: Secondary | ICD-10-CM | POA: Diagnosis not present

## 2023-12-13 DIAGNOSIS — M19012 Primary osteoarthritis, left shoulder: Secondary | ICD-10-CM | POA: Insufficient documentation

## 2023-12-13 DIAGNOSIS — Z96612 Presence of left artificial shoulder joint: Secondary | ICD-10-CM | POA: Diagnosis not present

## 2023-12-13 DIAGNOSIS — M75102 Unspecified rotator cuff tear or rupture of left shoulder, not specified as traumatic: Secondary | ICD-10-CM | POA: Insufficient documentation

## 2023-12-13 DIAGNOSIS — F17210 Nicotine dependence, cigarettes, uncomplicated: Secondary | ICD-10-CM | POA: Diagnosis not present

## 2023-12-13 DIAGNOSIS — G8918 Other acute postprocedural pain: Secondary | ICD-10-CM | POA: Diagnosis not present

## 2023-12-13 DIAGNOSIS — K219 Gastro-esophageal reflux disease without esophagitis: Secondary | ICD-10-CM | POA: Insufficient documentation

## 2023-12-13 DIAGNOSIS — M12812 Other specific arthropathies, not elsewhere classified, left shoulder: Secondary | ICD-10-CM | POA: Diagnosis not present

## 2023-12-13 DIAGNOSIS — G894 Chronic pain syndrome: Secondary | ICD-10-CM

## 2023-12-13 HISTORY — PX: REVERSE SHOULDER ARTHROPLASTY: SHX5054

## 2023-12-13 SURGERY — ARTHROPLASTY, SHOULDER, TOTAL, REVERSE
Anesthesia: General | Site: Shoulder | Laterality: Left

## 2023-12-13 MED ORDER — TRANEXAMIC ACID-NACL 1000-0.7 MG/100ML-% IV SOLN
1000.0000 mg | INTRAVENOUS | Status: AC
  Administered 2023-12-13: 1000 mg via INTRAVENOUS
  Filled 2023-12-13: qty 100

## 2023-12-13 MED ORDER — PROPOFOL 10 MG/ML IV BOLUS
INTRAVENOUS | Status: DC | PRN
  Administered 2023-12-13: 160 mg via INTRAVENOUS

## 2023-12-13 MED ORDER — PROPOFOL 10 MG/ML IV BOLUS
INTRAVENOUS | Status: AC
  Filled 2023-12-13: qty 20

## 2023-12-13 MED ORDER — OXYCODONE HCL 5 MG PO TABS: 5.0000 mg | ORAL_TABLET | ORAL | 0 refills | Status: AC | PRN

## 2023-12-13 MED ORDER — ONDANSETRON HCL 4 MG/2ML IJ SOLN
INTRAMUSCULAR | Status: AC
  Filled 2023-12-13: qty 2

## 2023-12-13 MED ORDER — LACTATED RINGERS IV SOLN: INTRAVENOUS | Status: DC

## 2023-12-13 MED ORDER — SUCCINYLCHOLINE CHLORIDE 200 MG/10ML IV SOSY
PREFILLED_SYRINGE | INTRAVENOUS | Status: AC
  Filled 2023-12-13: qty 10

## 2023-12-13 MED ORDER — BUPIVACAINE-EPINEPHRINE (PF) 0.25% -1:200000 IJ SOLN
INTRAMUSCULAR | Status: DC | PRN
  Administered 2023-12-13: 14 mL

## 2023-12-13 MED ORDER — ALBUTEROL SULFATE (2.5 MG/3ML) 0.083% IN NEBU
INHALATION_SOLUTION | RESPIRATORY_TRACT | Status: AC
  Filled 2023-12-13: qty 3

## 2023-12-13 MED ORDER — MIDAZOLAM HCL 2 MG/2ML IJ SOLN
1.0000 mg | Freq: Once | INTRAMUSCULAR | Status: DC
  Filled 2023-12-13: qty 2

## 2023-12-13 MED ORDER — OXYCODONE HCL 5 MG PO TABS
5.0000 mg | ORAL_TABLET | ORAL | Status: DC | PRN
  Administered 2023-12-13: 5 mg via ORAL

## 2023-12-13 MED ORDER — CEFAZOLIN SODIUM-DEXTROSE 2-4 GM/100ML-% IV SOLN
2.0000 g | INTRAVENOUS | Status: AC
  Administered 2023-12-13: 2 g via INTRAVENOUS
  Filled 2023-12-13: qty 100

## 2023-12-13 MED ORDER — ALBUTEROL SULFATE HFA 108 (90 BASE) MCG/ACT IN AERS
INHALATION_SPRAY | RESPIRATORY_TRACT | Status: DC | PRN
  Administered 2023-12-13: 8 via RESPIRATORY_TRACT

## 2023-12-13 MED ORDER — DEXAMETHASONE SODIUM PHOSPHATE 10 MG/ML IJ SOLN
INTRAMUSCULAR | Status: AC
  Filled 2023-12-13: qty 1

## 2023-12-13 MED ORDER — ALBUTEROL SULFATE HFA 108 (90 BASE) MCG/ACT IN AERS
INHALATION_SPRAY | RESPIRATORY_TRACT | Status: AC
  Filled 2023-12-13: qty 6.7

## 2023-12-13 MED ORDER — ORAL CARE MOUTH RINSE: 15.0000 mL | Freq: Once | OROMUCOSAL | Status: AC

## 2023-12-13 MED ORDER — FENTANYL CITRATE (PF) 250 MCG/5ML IJ SOLN
INTRAMUSCULAR | Status: DC | PRN
  Administered 2023-12-13 (×2): 50 ug via INTRAVENOUS

## 2023-12-13 MED ORDER — FENTANYL CITRATE (PF) 100 MCG/2ML IJ SOLN
INTRAMUSCULAR | Status: AC
  Filled 2023-12-13: qty 2

## 2023-12-13 MED ORDER — LIDOCAINE HCL (PF) 2 % IJ SOLN
INTRAMUSCULAR | Status: AC
  Filled 2023-12-13: qty 5

## 2023-12-13 MED ORDER — LIDOCAINE 2% (20 MG/ML) 5 ML SYRINGE
INTRAMUSCULAR | Status: DC | PRN
  Administered 2023-12-13: 20 mg via INTRAVENOUS

## 2023-12-13 MED ORDER — PHENYLEPHRINE HCL-NACL 20-0.9 MG/250ML-% IV SOLN
INTRAVENOUS | Status: DC | PRN
  Administered 2023-12-13: 50 ug/min via INTRAVENOUS

## 2023-12-13 MED ORDER — AMISULPRIDE (ANTIEMETIC) 5 MG/2ML IV SOLN: 10.0000 mg | Freq: Once | INTRAVENOUS | Status: DC | PRN

## 2023-12-13 MED ORDER — ONDANSETRON HCL 4 MG PO TABS: 4.0000 mg | ORAL_TABLET | Freq: Three times a day (TID) | ORAL | 1 refills | Status: AC | PRN

## 2023-12-13 MED ORDER — FENTANYL CITRATE PF 50 MCG/ML IJ SOSY
25.0000 ug | PREFILLED_SYRINGE | INTRAMUSCULAR | Status: DC | PRN
  Administered 2023-12-13 (×2): 50 ug via INTRAVENOUS

## 2023-12-13 MED ORDER — BUPIVACAINE LIPOSOME 1.3 % IJ SUSP
INTRAMUSCULAR | Status: DC | PRN
  Administered 2023-12-13: 10 mL via PERINEURAL

## 2023-12-13 MED ORDER — BUPIVACAINE-EPINEPHRINE 0.25% -1:200000 IJ SOLN
INTRAMUSCULAR | Status: AC
  Filled 2023-12-13: qty 1

## 2023-12-13 MED ORDER — CHLORHEXIDINE GLUCONATE 0.12 % MT SOLN
15.0000 mL | Freq: Once | OROMUCOSAL | Status: AC
  Administered 2023-12-13: 15 mL via OROMUCOSAL

## 2023-12-13 MED ORDER — OXYCODONE HCL 5 MG PO TABS
ORAL_TABLET | ORAL | Status: AC
  Filled 2023-12-13: qty 1

## 2023-12-13 MED ORDER — DEXAMETHASONE SODIUM PHOSPHATE 10 MG/ML IJ SOLN
INTRAMUSCULAR | Status: DC | PRN
  Administered 2023-12-13: 10 mg via INTRAVENOUS

## 2023-12-13 MED ORDER — ALBUTEROL SULFATE (2.5 MG/3ML) 0.083% IN NEBU
2.5000 mg | INHALATION_SOLUTION | Freq: Four times a day (QID) | RESPIRATORY_TRACT | Status: DC | PRN
  Administered 2023-12-13: 2.5 mg via RESPIRATORY_TRACT

## 2023-12-13 MED ORDER — 0.9 % SODIUM CHLORIDE (POUR BTL) OPTIME
TOPICAL | Status: DC | PRN
  Administered 2023-12-13 (×2): 1000 mL

## 2023-12-13 MED ORDER — FENTANYL CITRATE PF 50 MCG/ML IJ SOSY
PREFILLED_SYRINGE | INTRAMUSCULAR | Status: AC
  Filled 2023-12-13: qty 2

## 2023-12-13 MED ORDER — BUPIVACAINE HCL (PF) 0.5 % IJ SOLN
INTRAMUSCULAR | Status: DC | PRN
  Administered 2023-12-13: 15 mL via PERINEURAL

## 2023-12-13 MED ORDER — ONDANSETRON HCL 4 MG/2ML IJ SOLN
INTRAMUSCULAR | Status: DC | PRN
  Administered 2023-12-13: 4 mg via INTRAVENOUS

## 2023-12-13 MED ORDER — SUCCINYLCHOLINE CHLORIDE 200 MG/10ML IV SOSY
PREFILLED_SYRINGE | INTRAVENOUS | Status: DC | PRN
  Administered 2023-12-13: 140 mg via INTRAVENOUS

## 2023-12-13 MED ORDER — FENTANYL CITRATE PF 50 MCG/ML IJ SOSY
50.0000 ug | PREFILLED_SYRINGE | Freq: Once | INTRAMUSCULAR | Status: AC
  Administered 2023-12-13: 50 ug via INTRAVENOUS
  Filled 2023-12-13: qty 2

## 2023-12-13 SURGICAL SUPPLY — 64 items
BAG COUNTER SPONGE SURGICOUNT (BAG) IMPLANT
BAG ZIPLOCK 12X15 (MISCELLANEOUS) IMPLANT
BIT DRILL 1.6MX128 (BIT) IMPLANT
BIT DRILL 170X2.5X (BIT) IMPLANT
BIT DRL 170X2.5X (BIT) ×1
BLADE SAG 18X100X1.27 (BLADE) ×1 IMPLANT
COVER BACK TABLE 60X90IN (DRAPES) ×1 IMPLANT
COVER SURGICAL LIGHT HANDLE (MISCELLANEOUS) ×1 IMPLANT
CUP HUMERAL 42 PLUS 3 (Orthopedic Implant) IMPLANT
DRAPE INCISE IOBAN 66X45 STRL (DRAPES) ×1 IMPLANT
DRAPE SHEET LG 3/4 BI-LAMINATE (DRAPES) ×1 IMPLANT
DRAPE SURG ORHT 6 SPLT 77X108 (DRAPES) ×2 IMPLANT
DRAPE TOP 10253 STERILE (DRAPES) ×1 IMPLANT
DRAPE U-SHAPE 47X51 STRL (DRAPES) ×1 IMPLANT
DRSG ADAPTIC 3X8 NADH LF (GAUZE/BANDAGES/DRESSINGS) ×1 IMPLANT
DRSG AQUACEL AG ADV 3.5X10 (GAUZE/BANDAGES/DRESSINGS) IMPLANT
DURAPREP 26ML APPLICATOR (WOUND CARE) ×1 IMPLANT
ELECT BLADE TIP CTD 4 INCH (ELECTRODE) ×1 IMPLANT
ELECT NDL TIP 2.8 STRL (NEEDLE) ×1 IMPLANT
ELECT NEEDLE TIP 2.8 STRL (NEEDLE) ×1
ELECT REM PT RETURN 15FT ADLT (MISCELLANEOUS) ×1 IMPLANT
EPIPHSYSI CENTER SZ 2 LT (Shoulder) ×1 IMPLANT
EPIPHYSIS CENTER SZ 2 LT (Shoulder) IMPLANT
FACESHIELD WRAPAROUND (MASK) ×1
FACESHIELD WRAPAROUND OR TEAM (MASK) ×1 IMPLANT
GAUZE PAD ABD 8X10 STRL (GAUZE/BANDAGES/DRESSINGS) ×1 IMPLANT
GAUZE SPONGE 4X4 12PLY STRL (GAUZE/BANDAGES/DRESSINGS) ×1 IMPLANT
GLENOSPHERE XTEND LAT 42+0 STD (Miscellaneous) IMPLANT
GLOVE BIOGEL PI IND STRL 7.5 (GLOVE) ×1 IMPLANT
GLOVE BIOGEL PI IND STRL 8.5 (GLOVE) ×1 IMPLANT
GLOVE ORTHO TXT STRL SZ7.5 (GLOVE) ×1 IMPLANT
GLOVE SURG ORTHO 8.5 STRL (GLOVE) ×1 IMPLANT
GOWN STRL REUS W/ TWL XL LVL3 (GOWN DISPOSABLE) ×2 IMPLANT
KIT BASIN OR (CUSTOM PROCEDURE TRAY) ×1 IMPLANT
KIT TURNOVER KIT A (KITS) IMPLANT
MANIFOLD NEPTUNE II (INSTRUMENTS) ×1 IMPLANT
METAGLENE DELTA EXTEND (Trauma) IMPLANT
METAGLENE DXTEND (Trauma) ×1 IMPLANT
NDL MAYO CATGUT SZ4 TPR NDL (NEEDLE) IMPLANT
NEEDLE MAYO CATGUT SZ4 (NEEDLE)
NS IRRIG 1000ML POUR BTL (IV SOLUTION) ×1 IMPLANT
PACK SHOULDER (CUSTOM PROCEDURE TRAY) ×1 IMPLANT
PIN GUIDE 1.2 (PIN) IMPLANT
PIN GUIDE GLENOPHERE 1.5MX300M (PIN) IMPLANT
PIN METAGLENE 2.5 (PIN) IMPLANT
RESTRAINT HEAD UNIVERSAL NS (MISCELLANEOUS) ×1 IMPLANT
SCREW 48L (Screw) IMPLANT
SCREW BN 18X4.5XSTRL SHLDR (Screw) IMPLANT
SCREW LOCK DELTA XTEND 4.5X30 (Screw) IMPLANT
SLING ARM FOAM STRAP LRG (SOFTGOODS) IMPLANT
SPIKE FLUID TRANSFER (MISCELLANEOUS) ×1 IMPLANT
SPONGE T-LAP 4X18 ~~LOC~~+RFID (SPONGE) IMPLANT
STEM 12 HA (Stem) IMPLANT
STRIP CLOSURE SKIN 1/2X4 (GAUZE/BANDAGES/DRESSINGS) ×1 IMPLANT
SUT FIBERWIRE #2 38 T-5 BLUE (SUTURE) ×2
SUT MNCRL AB 4-0 PS2 18 (SUTURE) ×1 IMPLANT
SUT VIC AB 0 CT1 36 (SUTURE) ×1 IMPLANT
SUT VIC AB 0 CT2 27 (SUTURE) ×1 IMPLANT
SUT VIC AB 2-0 CT1 TAPERPNT 27 (SUTURE) ×1 IMPLANT
SUTURE FIBERWR #2 38 T-5 BLUE (SUTURE) ×1 IMPLANT
TAPE CLOTH SURG 4X10 WHT LF (GAUZE/BANDAGES/DRESSINGS) IMPLANT
TOWEL GREEN STERILE FF (TOWEL DISPOSABLE) ×1 IMPLANT
TOWEL OR 17X26 10 PK STRL BLUE (TOWEL DISPOSABLE) ×1 IMPLANT
WATER STERILE IRR 1000ML POUR (IV SOLUTION) IMPLANT

## 2023-12-13 NOTE — Anesthesia Postprocedure Evaluation (Signed)
Anesthesia Post Note  Patient: Todd Hayes  Procedure(s) Performed: REVERSE SHOULDER ARTHROPLASTY (Left: Shoulder)     Patient location during evaluation: PACU Anesthesia Type: General Level of consciousness: awake and alert Pain management: pain level controlled Vital Signs Assessment: post-procedure vital signs reviewed and stable Respiratory status: spontaneous breathing, nonlabored ventilation and respiratory function stable Cardiovascular status: stable and blood pressure returned to baseline Anesthetic complications: no   No notable events documented.  Last Vitals:  Vitals:   12/13/23 1635 12/13/23 1702  BP: (!) 182/90 (!) 161/76  Pulse:    Resp: 16   Temp: 36.8 C   SpO2: 91%     Last Pain:  Vitals:   12/13/23 1635  TempSrc:   PainSc: 0-No pain                 Beryle Lathe

## 2023-12-13 NOTE — Brief Op Note (Signed)
12/13/2023  2:42 PM  PATIENT:  Todd Hayes  68 y.o. male  PRE-OPERATIVE DIAGNOSIS:  Left shoulder rotator cuff arthropathy  POST-OPERATIVE DIAGNOSIS:  Left shoulder rotator cuff arthropathy  PROCEDURE:  Procedure(s) with comments: REVERSE SHOULDER ARTHROPLASTY (Left) - interscalene block DePuy Delta Xtend with NO subscap repair  SURGEON:  Surgeons and Role:    Beverely Low, MD - Primary  PHYSICIAN ASSISTANT:   ASSISTANTS: Thea Gist, PA-C   ANESTHESIA:   regional and general  EBL:  175 mL   BLOOD ADMINISTERED:none  DRAINS: none   LOCAL MEDICATIONS USED:  MARCAINE     SPECIMEN:  No Specimen  DISPOSITION OF SPECIMEN:  N/A  COUNTS:  YES  TOURNIQUET:  * No tourniquets in log *  DICTATION: .Other Dictation: Dictation Number [id number]  PLAN OF CARE: Discharge to home after PACU  PATIENT DISPOSITION:  PACU - hemodynamically stable.   Delay start of Pharmacological VTE agent (>24hrs) due to surgical blood loss or risk of bleeding: not applicable

## 2023-12-13 NOTE — Anesthesia Preprocedure Evaluation (Signed)
Anesthesia Evaluation  Patient identified by MRN, date of birth, ID band Patient awake    Reviewed: Allergy & Precautions, NPO status , Patient's Chart, lab work & pertinent test results  Airway Mallampati: II  TM Distance: >3 FB Neck ROM: Full    Dental  (+) Dental Advisory Given   Pulmonary COPD, Current Smoker   breath sounds clear to auscultation       Cardiovascular hypertension, Pt. on medications  Rhythm:Regular Rate:Normal     Neuro/Psych negative neurological ROS     GI/Hepatic Neg liver ROS,GERD  ,,  Endo/Other  negative endocrine ROS    Renal/GU Renal InsufficiencyRenal disease     Musculoskeletal  (+) Arthritis ,    Abdominal   Peds  Hematology negative hematology ROS (+)   Anesthesia Other Findings   Reproductive/Obstetrics                             Anesthesia Physical Anesthesia Plan  ASA: 2  Anesthesia Plan: General   Post-op Pain Management: Regional block* and Tylenol PO (pre-op)*   Induction: Intravenous  PONV Risk Score and Plan: 1 and Dexamethasone, Ondansetron and Treatment may vary due to age or medical condition  Airway Management Planned: Oral ETT  Additional Equipment:   Intra-op Plan:   Post-operative Plan: Extubation in OR  Informed Consent: I have reviewed the patients History and Physical, chart, labs and discussed the procedure including the risks, benefits and alternatives for the proposed anesthesia with the patient or authorized representative who has indicated his/her understanding and acceptance.     Dental advisory given  Plan Discussed with: CRNA  Anesthesia Plan Comments:        Anesthesia Quick Evaluation

## 2023-12-13 NOTE — Discharge Instructions (Addendum)
Ice to the shoulder constantly.  Keep the incision covered and clean and dry for one week, then ok to get it wet in the shower. May change the surgical bandage to the Aquacel(silver pkg) on Sunday. Leave that on for one week then ok to remove and leave the incision open at that point. Ok to shower with the Aquacel in place.  Do exercise as instructed several times per day.  DO NOT reach behind your back or push up out of a chair with the operative arm.  Use a sling while you are up and around for comfort, may remove while seated.  Keep pillow propped behind the operative elbow.  Follow up with Dr Ranell Patrick in two weeks in the office, call [phone] for appt  Please call Dr Ranell Patrick (cell) at [phone] with any questions or concerns

## 2023-12-13 NOTE — Interval H&P Note (Signed)
History and Physical Interval Note:  12/13/2023 11:03 AM  Todd Hayes  has presented today for surgery, with the diagnosis of Left shoulder rotator cuff arthropathy.  The various methods of treatment have been discussed with the patient and family. After consideration of risks, benefits and other options for treatment, the patient has consented to  Procedure(s) with comments: REVERSE SHOULDER ARTHROPLASTY (Left) - interscalene block as a surgical intervention.  The patient's history has been reviewed, patient examined, no change in status, stable for surgery.  I have reviewed the patient's chart and labs.  Questions were answered to the patient's satisfaction.     Verlee Rossetti

## 2023-12-13 NOTE — Care Plan (Signed)
Ortho Bundle Case Management Note  Patient Details  Name: Todd Hayes MRN: [id number] Date of Birth: [birth year]                  L Rev TSA on 12/13/23.  DCP: Home with wife.  DME: No needs.  PT: HEP   DME Arranged:  N/A DME Agency:       Additional Comments: Please contact me with any questions of if this plan should need to change.    Despina Pole, CCM Case Manager, [facility]  [phone] 12/13/2023, 9:49 AM

## 2023-12-13 NOTE — Op Note (Unsigned)
NAME: QUINTAVIS, BRANDS MEDICAL RECORD NO: [id number] ACCOUNT NO: [id number] DATE OF BIRTH: [birth year] FACILITY: [facility] LOCATION: [facility] PHYSICIAN: Almedia Balls. Ranell Patrick, MD  Operative Report   DATE OF PROCEDURE: 12/13/2023  PREOPERATIVE DIAGNOSIS:  Left shoulder rotator cuff tear arthropathy.  POSTOPERATIVE DIAGNOSIS:  Left shoulder rotator cuff tear arthropathy.  PROCEDURE PERFORMED:  Left reverse total shoulder arthroplasty using DePuy Delta Xtend prosthesis with no subscap repair.  ATTENDING SURGEON:  Almedia Balls. Ranell Patrick, MD  ASSISTANT:  Konrad Felix Dixon, [place], who was scrubbed during the entire procedure and necessary for satisfactory completion of surgery.  ANESTHESIA:  General anesthesia was used plus interscalene block.  ESTIMATED BLOOD LOSS:  Less than 150 mL.  FLUID REPLACEMENT:  1000 mL crystalloid.  COUNTS:  Instrument count was correct.  COMPLICATIONS:  None.  ANTIBIOTICS:  Perioperative antibiotics were given.  INDICATIONS:  The patient is a 68 year old male with worsening left shoulder pain secondary to rotator cuff tear arthropathy.  The patient has failed conservative management and desires operative treatment to eliminate pain and restore function.   Informed consent was obtained.  DESCRIPTION OF PROCEDURE:  After an adequate level of general anesthesia was achieved, the patient was positioned in the modified beach chair position.  The left shoulder was correctly identified and sterile prep and drape performed.  Timeout called,  verifying correct patient and correct site.  We entered the patient's shoulder using a standard deltopectoral incision starting at the coracoid process and extending down to the anterior humerus.  Using a 10 blade scalpel, dissection down through the  subcutaneous tissues using Bovie, the cephalic vein was identified and taken laterally with the deltoid.  Pectoralis taken medially.  Conjoint tendon was identified and retracted medially.  Deep  retractor was placed.  The biceps was tenodesed in situ  with 0 Vicryl figure-of-eight suture x2.  We then released the subscapularis off the lesser tuberosity and tagged for protection of the axillary nerve.  The subscap was in poor condition and not repairable.  We then released the inferior capsule  progressively externally rotating and extending the shoulder, delivering the humeral head out of the wound.  The humeral head had no cartilage on it.  We entered the proximal humerus with a 6 mm reamer reaming up to a size 12.  We then took our 12 mm  T-handle guide and resected the head to 20 degrees of retroversion with the oscillating saw.  We removed excess osteophytes with a rongeur.  We irrigated thoroughly.  We then subluxed the humerus posteriorly.  We had good exposure of the glenoid.  We  removed the biceps stump, capsule and labrum.  Once we had good exposure, we removed the remaining cartilage with a Cobb elevator and rongeur.  We then placed our guide pin centered low on the glenoid and reamed to the subchondral bone for the metaglene  baseplate.  We did our peripheral hand reaming with the T-handle reamer and then drilled out the central peg hole under power.  We then irrigated again and then placed the HA-coated press-fit base plate into position, impacting that down to good solid  bony support.  We selected a 48 screw inferiorly, then a 30 screw superiorly and then an 18 anterior and an 18 posterior.  We secured and locked the superior and inferior screws.  We then applied a 42 +0 standard glenosphere to the baseplate and secured  it with a screwdriver.  Once we had that glenosphere in place, I did a finger sweep  to make sure we had no soft tissue caught up between the baseplate and the glenosphere.  We then went back to the humerus and reamed for the 2 left metaphysis.  We then  trialed with the 12 stem and the 2 left metaphysis set in the 0 setting and placed in 20 degrees of retroversion.   We trialed with a 42 +3 poly, placed on the humeral tray, and reduced the shoulder.  We had excellent soft tissue balance and stability.   We then went ahead and removed the trial components from the humeral side.  We irrigated thoroughly.  I then used available bone graft from the humeral head with impaction grafting technique and we placed the 12 stem and the 2 left metaphysis set on the  0 setting and impacted into 20 degrees of retroversion.  With the stem secured, we selected the real 42 +3 poly placed on the humeral tray, impacted that, reduced the shoulder, had nice soft tissue balancing and stability throughout a full arc of motion.   The conjoint was appropriately tensioned.  We irrigated thoroughly.  I then went ahead and resected the subscap remnant.  Next, we repaired the deltopectoral interval with 0 Vicryl suture followed by 2-0 Vicryl for subcutaneous closure and 4-0 Monocryl  for skin.  Steri-Strips were applied followed by a sterile dressing.  The patient tolerated the surgical well.   VAI D: 12/13/2023 2:47:26 pm T: 12/13/2023 8:10:00 pm  JOB: [id number]/ [id number]

## 2023-12-13 NOTE — Anesthesia Procedure Notes (Signed)
Anesthesia Regional Block: Interscalene brachial plexus block   Pre-Anesthetic Checklist: , timeout performed,  Correct Patient, Correct Site, Correct Laterality,  Correct Procedure, Correct Position, site marked,  Risks and benefits discussed,  Surgical consent,  Pre-op evaluation,  At surgeon's request and post-op pain management  Laterality: Left  Prep: chloraprep       Needles:  Injection technique: Single-shot  Needle Type: Echogenic Needle     Needle Length: 9cm  Needle Gauge: 21     Additional Needles:   Procedures:,,,, ultrasound used (permanent image in chart),,    Narrative:  Start time: 12/13/2023 11:31 AM End time: 12/13/2023 11:38 AM Injection made incrementally with aspirations every 5 mL.  Performed by: Personally  Anesthesiologist: Marcene Duos, MD

## 2023-12-13 NOTE — Anesthesia Procedure Notes (Signed)
Procedure Name: Intubation Date/Time: 12/13/2023 12:59 PM  Performed by: Elyn Peers, CRNAPre-anesthesia Checklist: Patient identified, Emergency Drugs available, Suction available, Patient being monitored and Timeout performed Patient Re-evaluated:Patient Re-evaluated prior to induction Oxygen Delivery Method: Circle system utilized Preoxygenation: Pre-oxygenation with 100% oxygen Induction Type: IV induction Laryngoscope Size: Miller and 3 Grade View: Grade I Tube type: Oral Tube size: 8.0 mm Number of attempts: 1 Airway Equipment and Method: Stylet Placement Confirmation: ETT inserted through vocal cords under direct vision, positive ETCO2 and breath sounds checked- equal and bilateral Secured at: 23 cm Tube secured with: Tape Dental Injury: Teeth and Oropharynx as per pre-operative assessment

## 2023-12-13 NOTE — Transfer of Care (Signed)
Immediate Anesthesia Transfer of Care Note  Patient: Todd Hayes  Procedure(s) Performed: REVERSE SHOULDER ARTHROPLASTY (Left: Shoulder)  Patient Location: PACU  Anesthesia Type:GA combined with regional for post-op pain  Level of Consciousness: awake, alert , and oriented  Airway & Oxygen Therapy: Patient Spontanous Breathing and Patient connected to face mask oxygen  Post-op Assessment: Report given to RN and Post -op Vital signs reviewed and stable  Post vital signs: Reviewed and stable  Last Vitals:  Vitals Value Taken Time  BP 138/74 12/13/23 1445  Temp    Pulse 94 12/13/23 1445  Resp 15 12/13/23 1445  SpO2 96 % 12/13/23 1445    Last Pain:  Vitals:   12/13/23 1140  TempSrc:   PainSc: 0-No pain      Patients Stated Pain Goal: 4 (12/13/23 1024)  Complications: No notable events documented.

## 2023-12-13 NOTE — Evaluation (Signed)
Occupational Therapy Evaluation Patient Details Name: Todd Hayes MRN: [id number] DOB: 06-18-1956 Today's Date: 12/13/2023   History of Present Illness Pt is a 68 yo male s/p left REVERSE SHOULDER ARTHROPLASTY.   Clinical Impression   PTA pt lives spouse.  Education completed regarding compensatory strategies for ADL tasks and functional mobility, management of sling, LUE ROM per specified parameters in the order set as indicated below, positioning of operative arm in sitting and supine and edema control, including use of "Iceman" Cold Therapy machine. Caregiver present for education, written handouts provided and reviewed using Teach Back and pt/caregiver verbalized/demonstrated understanding. Due to the below listed deficits, pt requires min -total assistance with ADL tasks and CGA assist with functional mobility. Caregiver will be able to provide necessary level of assistance at discharge. Pt to follow up with MD to progress rehab of the operative shoulder.         If plan is discharge home, recommend the following: A little help with walking and/or transfers;A lot of help with bathing/dressing/bathroom;Assistance with cooking/housework;Assist for transportation;Help with stairs or ramp for entrance    Functional Status Assessment  Patient has had a recent decline in their functional status and demonstrates the ability to make significant improvements in function in a reasonable and predictable amount of time. (all OT education completed)  Equipment Recommendations  None recommended by OT    Recommendations for Other Services       Precautions / Restrictions Precautions Precautions: Shoulder Shoulder Interventions: Shoulder sling/immobilizer Precaution Booklet Issued: Yes (comment) Required Braces or Orthoses: Sling Restrictions Weight Bearing Restrictions Per Provider Order: Yes LUE Weight Bearing Per Provider Order: Non weight bearing  Sling at all times except ADL/exercise  Yes   Non weight bearing Yes   AROM elbow, wrist and hand to tolerance Yes   PROM of shoulder No   AROM of shoulder No   OT Consult Special Instructions ok for gentle hand to face ADLs       Mobility Bed Mobility               General bed mobility comments: Pt recevied in recliner    Transfers Overall transfer level: Modified independent Equipment used: None                      Balance Overall balance assessment: Mild deficits observed, not formally tested                                              Vision Baseline Vision/History: 1 Wears glasses Ability to See in Adequate Light: 0 Adequate Patient Visual Report: No change from baseline              Pertinent Vitals/Pain Pain Assessment Pain Assessment: No/denies pain (block has not worn off as of yer)     Extremity/Trunk Assessment Upper Extremity Assessment Upper Extremity Assessment: Right hand dominant;LUE deficits/detail LUE Deficits / Details: shoulder sx this admission, has some movement in hand and wrist but still numb to feeling LUE Coordination: decreased gross motor;decreased fine motor           Communication Communication Communication: No apparent difficulties   Cognition Arousal: Alert Behavior During Therapy: WFL for tasks assessed/performed Overall Cognitive Status: Within Functional Limits for tasks assessed  Home Living Family/patient expects to be discharged to:: Private residence Living Arrangements: Spouse/significant other Available Help at Discharge: Family;Available PRN/intermittently                                            OT Problem List: Decreased strength;Decreased range of motion;Obesity;Impaired UE functional use         OT Goals(Current goals can be found in the care plan section) Acute Rehab OT Goals Patient Stated Goal: to go home today          AM-PAC OT "6 Clicks" Daily Activity     Outcome Measure Help from another person eating meals?: A Little Help from another person taking care of personal grooming?: A Lot Help from another person toileting, which includes using toliet, bedpan, or urinal?: A Lot Help from another person bathing (including washing, rinsing, drying)?: A Lot Help from another person to put on and taking off regular upper body clothing?: Total Help from another person to put on and taking off regular lower body clothing?: Total 6 Click Score: 11   End of Session Equipment Utilized During Treatment:  (sling) Nurse Communication:  (Pt ready to go from an OT standpoint)  Activity Tolerance: Patient tolerated treatment well Patient left: in chair  OT Visit Diagnosis: Unsteadiness on feet (R26.81)                Time: 1610-9604 OT Time Calculation (min): 22 min Charges:  OT General Charges $OT Visit: 1 Visit OT Evaluation $OT Eval Moderate Complexity: 1 Mod  Cathy L. OT Acute Rehabilitation Services Office [phone]    Evette Georges 12/13/2023, 5:35 PM

## 2023-12-16 ENCOUNTER — Encounter (HOSPITAL_COMMUNITY): Payer: Self-pay | Admitting: Orthopedic Surgery

## 2023-12-19 ENCOUNTER — Other Ambulatory Visit: Payer: Self-pay | Admitting: Student

## 2023-12-19 DIAGNOSIS — M4716 Other spondylosis with myelopathy, lumbar region: Secondary | ICD-10-CM

## 2023-12-19 DIAGNOSIS — J301 Allergic rhinitis due to pollen: Secondary | ICD-10-CM

## 2023-12-26 DIAGNOSIS — Z4789 Encounter for other orthopedic aftercare: Secondary | ICD-10-CM | POA: Diagnosis not present

## 2024-01-14 ENCOUNTER — Other Ambulatory Visit: Payer: Self-pay | Admitting: Student

## 2024-01-14 DIAGNOSIS — G2581 Restless legs syndrome: Secondary | ICD-10-CM

## 2024-01-15 ENCOUNTER — Other Ambulatory Visit: Payer: Self-pay | Admitting: Student

## 2024-01-15 DIAGNOSIS — I10 Essential (primary) hypertension: Secondary | ICD-10-CM

## 2024-01-15 DIAGNOSIS — M4716 Other spondylosis with myelopathy, lumbar region: Secondary | ICD-10-CM

## 2024-02-12 ENCOUNTER — Other Ambulatory Visit: Payer: Self-pay | Admitting: Student

## 2024-02-12 DIAGNOSIS — M4716 Other spondylosis with myelopathy, lumbar region: Secondary | ICD-10-CM

## 2024-02-13 DIAGNOSIS — Z4789 Encounter for other orthopedic aftercare: Secondary | ICD-10-CM | POA: Diagnosis not present

## 2024-02-14 ENCOUNTER — Other Ambulatory Visit: Payer: Self-pay | Admitting: Student

## 2024-02-14 DIAGNOSIS — K219 Gastro-esophageal reflux disease without esophagitis: Secondary | ICD-10-CM

## 2024-03-12 ENCOUNTER — Other Ambulatory Visit: Payer: Self-pay | Admitting: Student

## 2024-03-12 DIAGNOSIS — M4716 Other spondylosis with myelopathy, lumbar region: Secondary | ICD-10-CM

## 2024-03-19 ENCOUNTER — Other Ambulatory Visit: Payer: Self-pay | Admitting: Student

## 2024-03-19 DIAGNOSIS — M4716 Other spondylosis with myelopathy, lumbar region: Secondary | ICD-10-CM

## 2024-03-19 DIAGNOSIS — G4709 Other insomnia: Secondary | ICD-10-CM

## 2024-03-19 DIAGNOSIS — G894 Chronic pain syndrome: Secondary | ICD-10-CM

## 2024-06-11 ENCOUNTER — Other Ambulatory Visit: Payer: Self-pay

## 2024-06-11 DIAGNOSIS — M4716 Other spondylosis with myelopathy, lumbar region: Secondary | ICD-10-CM

## 2024-06-11 MED ORDER — MELOXICAM 7.5 MG PO TABS
7.5000 mg | ORAL_TABLET | Freq: Every day | ORAL | 2 refills | Status: DC | PRN
Start: 2024-06-11 — End: 2024-09-07

## 2024-06-14 ENCOUNTER — Other Ambulatory Visit: Payer: Self-pay | Admitting: Student

## 2024-06-14 DIAGNOSIS — I10 Essential (primary) hypertension: Secondary | ICD-10-CM

## 2024-06-15 ENCOUNTER — Other Ambulatory Visit: Payer: Self-pay

## 2024-06-15 DIAGNOSIS — K219 Gastro-esophageal reflux disease without esophagitis: Secondary | ICD-10-CM

## 2024-06-15 DIAGNOSIS — G2581 Restless legs syndrome: Secondary | ICD-10-CM

## 2024-06-16 MED ORDER — PANTOPRAZOLE SODIUM 20 MG PO TBEC: 20.0000 mg | DELAYED_RELEASE_TABLET | Freq: Every day | ORAL | 0 refills | Status: DC

## 2024-06-16 MED ORDER — ROPINIROLE HCL 0.25 MG PO TABS: 0.5000 mg | ORAL_TABLET | Freq: Every day | ORAL | 0 refills | Status: DC

## 2024-07-17 ENCOUNTER — Ambulatory Visit: Admitting: Family Medicine

## 2024-07-17 ENCOUNTER — Encounter: Payer: Self-pay | Admitting: Family Medicine

## 2024-07-17 VITALS — BP 137/76 | HR 68 | Ht 72.0 in | Wt 224.8 lb

## 2024-07-17 DIAGNOSIS — R202 Paresthesia of skin: Secondary | ICD-10-CM | POA: Diagnosis not present

## 2024-07-17 DIAGNOSIS — G894 Chronic pain syndrome: Secondary | ICD-10-CM | POA: Diagnosis not present

## 2024-07-17 DIAGNOSIS — I10 Essential (primary) hypertension: Secondary | ICD-10-CM

## 2024-07-17 DIAGNOSIS — Z1211 Encounter for screening for malignant neoplasm of colon: Secondary | ICD-10-CM | POA: Diagnosis not present

## 2024-07-17 DIAGNOSIS — R200 Anesthesia of skin: Secondary | ICD-10-CM

## 2024-07-17 DIAGNOSIS — E782 Mixed hyperlipidemia: Secondary | ICD-10-CM

## 2024-07-17 MED ORDER — GABAPENTIN 300 MG PO CAPS
300.0000 mg | ORAL_CAPSULE | Freq: Three times a day (TID) | ORAL | 3 refills | Status: DC | PRN
Start: 2024-07-17 — End: 2024-08-11

## 2024-07-17 MED ORDER — BACLOFEN 20 MG PO TABS: 20.0000 mg | ORAL_TABLET | Freq: Three times a day (TID) | ORAL | 0 refills | Status: DC

## 2024-07-17 NOTE — Progress Notes (Signed)
    SUBJECTIVE:   CHIEF COMPLAINT / HPI:   Patient presents for general wellness visit, however does complain of ongoing bilateral arm numbness and tingling.  His arm numbness began following rotator cuff surgery on his left shoulder back at the beginning of the year.  He reports that his 4th and 5th digits on both sides have been going numb and feeling tingling and painful since that time.  He denies any loss of strength.  He declined formal physical therapy following his surgery in favor of home physical therapy.  PERTINENT  PMH / PSH: Primary hypertension COPD lumbar spondylosis with myelopathy CKD  OBJECTIVE:   BP 137/76   Pulse 68   Ht 6' (1.829 m)   Wt 224 lb 12.8 oz (102 kg)   SpO2 96%   BMI 30.49 kg/m   General: A&O, NAD HEENT: No sign of trauma, EOM grossly intact Cardiac: RRR, no m/r/g Respiratory: CTAB, normal WOB, no w/c/r GI: Soft, NTTP, non-distended  MSK, Shoulder:  Palpation: No tenderness over the [facility] or glenohumeral joints.  No obvious step off or sulcus sign. No tenderness over the  coracoid or biceps tendons. No obvious swelling or  Edema.  Skin: Skin over the joint is without evident bruising or erythema. No wounds or other abnormalities  Strength: 5/5 strength in all four plains of motion. No pain elicited during strength testing.  Range of motion: Full, pain free ROM in all plains of motion at the shoulder, neck elbow and wrist  Special tests: Bilaterally positive empty can.  Active Tinel bilaterally at the cubital tunnel  ASSESSMENT/PLAN:   Assessment & Plan Chronic pain syndrome - Increase baclofen  to 20 mg twice daily.  Advised patient not to take more than twice a day as this could cause adverse effects. -Begin gabapentin  300 mg 3 times daily as needed.  Encouraged patient not to take this medication at the same time as his baclofen  and to stagger them as needed -Patient reported understanding and agreement with this course of action -Recommended  to patient formal physical therapy.  Patient declines at this time Bilateral arm numbness and tingling while sleeping - Bilateral nature and concern over length of numbness as well as patient's history of lumbar spondylosis with myelopathy, I have ordered plain films of his neck to be completed to assess for cervical radiculopathy -Patient will follow-up at the end of this month to go over the results of his x-ray and discuss next steps Primary hypertension Slightly above goal but less than 140/90 today, no changes to medications at this time -BMP, CBC today for routine monitoring Mixed hyperlipidemia - No changes to medications at this time, will recheck lipid panel today for routine monitoring Colon cancer screening - Ordered Cologuard, instructed patient on process for collection.   Lucie Pinal, DO [facility]

## 2024-07-17 NOTE — Assessment & Plan Note (Signed)
-   No changes to medications at this time, will recheck lipid panel today for routine monitoring

## 2024-07-17 NOTE — Assessment & Plan Note (Signed)
 Slightly above goal but less than 140/90 today, no changes to medications at this time -BMP, CBC today for routine monitoring

## 2024-07-17 NOTE — Assessment & Plan Note (Signed)
-   Increase baclofen  to 20 mg twice daily.  Advised patient not to take more than twice a day as this could cause adverse effects. -Begin gabapentin  300 mg 3 times daily as needed.  Encouraged patient not to take this medication at the same time as his baclofen  and to stagger them as needed -Patient reported understanding and agreement with this course of action -Recommended to patient formal physical therapy.  Patient declines at this time

## 2024-07-17 NOTE — Patient Instructions (Addendum)
 It was wonderful to see you today!  Today we did routine blood work to check on your electrolytes kidney function and cholesterol.  When I have these results available I will either send you a message through MyChart or as a letter if they are normal I will call you if there is anything abnormal.  Because you are having bilateral numbness in your hands I have ordered an x-ray of your neck to make sure that there is no nerve compression causing this pain.  I will follow-up with you on September 30 to further discuss.  I have also prescribed for you gabapentin .  Please do not take this at the same time as your baclofen  as this can cause extreme drowsiness.  Please call [phone] with any questions about today's appointment.   If you need any additional refills, please call your pharmacy before calling the office.  Lucie Pinal, DO Family Medicine

## 2024-07-18 LAB — CBC WITH DIFFERENTIAL/PLATELET
Basophils Absolute: 0 x10E3/uL (ref 0.0–0.2)
Basos: 1 %
EOS (ABSOLUTE): 0.1 x10E3/uL (ref 0.0–0.4)
Eos: 2 %
Hematocrit: 40.1 % (ref 37.5–51.0)
Hemoglobin: 14 g/dL (ref 13.0–17.7)
Immature Grans (Abs): 0 x10E3/uL (ref 0.0–0.1)
Immature Granulocytes: 0 %
Lymphocytes Absolute: 2.1 x10E3/uL (ref 0.7–3.1)
Lymphs: 42 %
MCH: 35.5 pg — ABNORMAL HIGH (ref 26.6–33.0)
MCHC: 34.9 g/dL (ref 31.5–35.7)
MCV: 102 fL — ABNORMAL HIGH (ref 79–97)
Monocytes Absolute: 0.4 x10E3/uL (ref 0.1–0.9)
Monocytes: 9 %
Neutrophils Absolute: 2.3 x10E3/uL (ref 1.4–7.0)
Neutrophils: 46 %
Platelets: 123 x10E3/uL — ABNORMAL LOW (ref 150–450)
RBC: 3.94 x10E6/uL — ABNORMAL LOW (ref 4.14–5.80)
RDW: 13.3 % (ref 11.6–15.4)
WBC: 5 x10E3/uL (ref 3.4–10.8)

## 2024-07-18 LAB — BASIC METABOLIC PANEL WITH GFR
BUN/Creatinine Ratio: 15 (ref 10–24)
BUN: 20 mg/dL (ref 8–27)
CO2: 24 mmol/L (ref 20–29)
Calcium: 9 mg/dL (ref 8.6–10.2)
Chloride: 99 mmol/L (ref 96–106)
Creatinine, Ser: 1.34 mg/dL — ABNORMAL HIGH (ref 0.76–1.27)
Glucose: 81 mg/dL (ref 70–99)
Potassium: 4.6 mmol/L (ref 3.5–5.2)
Sodium: 138 mmol/L (ref 134–144)
eGFR: 58 mL/min/1.73 — ABNORMAL LOW (ref >59)

## 2024-07-18 LAB — LIPID PANEL
Chol/HDL Ratio: 6.4 ratio — ABNORMAL HIGH (ref 0.0–5.0)
Cholesterol, Total: 218 mg/dL — ABNORMAL HIGH (ref 100–199)
HDL: 34 mg/dL — ABNORMAL LOW (ref >39)
LDL Chol Calc (NIH): 103 mg/dL — ABNORMAL HIGH (ref 0–99)
Triglycerides: 475 mg/dL — ABNORMAL HIGH (ref 0–149)
VLDL Cholesterol Cal: 81 mg/dL — ABNORMAL HIGH (ref 5–40)

## 2024-07-20 ENCOUNTER — Ambulatory Visit: Payer: Self-pay | Admitting: Family Medicine

## 2024-07-28 ENCOUNTER — Other Ambulatory Visit: Payer: Self-pay | Admitting: Family Medicine

## 2024-07-28 DIAGNOSIS — G894 Chronic pain syndrome: Secondary | ICD-10-CM

## 2024-08-11 ENCOUNTER — Ambulatory Visit (INDEPENDENT_AMBULATORY_CARE_PROVIDER_SITE_OTHER): Payer: Self-pay | Admitting: Family Medicine

## 2024-08-11 ENCOUNTER — Encounter: Payer: Self-pay | Admitting: Family Medicine

## 2024-08-11 ENCOUNTER — Ambulatory Visit
Admission: RE | Admit: 2024-08-11 | Discharge: 2024-08-11 | Disposition: A | Discharge status: Home / Self Care | Source: Ambulatory Visit | Attending: Family Medicine | Admitting: Family Medicine

## 2024-08-11 VITALS — BP 119/70 | HR 71 | Ht 72.0 in | Wt 227.0 lb

## 2024-08-11 DIAGNOSIS — M25511 Pain in right shoulder: Secondary | ICD-10-CM

## 2024-08-11 DIAGNOSIS — G8929 Other chronic pain: Secondary | ICD-10-CM

## 2024-08-11 DIAGNOSIS — M25512 Pain in left shoulder: Secondary | ICD-10-CM | POA: Diagnosis not present

## 2024-08-11 DIAGNOSIS — M542 Cervicalgia: Secondary | ICD-10-CM | POA: Diagnosis not present

## 2024-08-11 NOTE — Patient Instructions (Signed)
 It was wonderful to see you today!  Today we rescheduled your xray. Please go directly to [address] before 5 pm today to have your xrays done. We are also checking your B vitamins to follow up on possible causes of nerve pain.   Please call [phone] with any questions about today's appointment.   If you need any additional refills, please call your pharmacy before calling the office.  Lucie Pinal, DO Family Medicine

## 2024-08-11 NOTE — Progress Notes (Cosign Needed)
    SUBJECTIVE:   CHIEF COMPLAINT / HPI:   Patient presents for follow up of bilateral arm pain and numbness. He was unable to obtain needed xrays prior to today's visit. No change to symptoms from prior visit.  PERTINENT  PMH / PSH: Bilateral shoulder and arm pain, lumbar spondylosis with myelopathy  OBJECTIVE:   BP 119/70   Pulse 71   Ht 6' (1.829 m)   Wt 227 lb (103 kg)   SpO2 97%   BMI 30.79 kg/m   General: Well appearing, no distress Respiratory: calm, unlabored breathing MSK: Full ROM bilaterally in the upper extremities, no muscular weakness noted, intact position and vibratory sense.  ASSESSMENT/PLAN:   Assessment & Plan Chronic pain of both shoulders -reordered DG C-spine studies, sent to [facility] for walk in immediately upon leaving the clinic -ordered vitamin b12 and b1 levels, patient had macrocytosis on last CBC and a history of B12 deficiency   Lucie Pinal, DO [facility]

## 2024-08-12 ENCOUNTER — Ambulatory Visit: Payer: Self-pay | Admitting: Family Medicine

## 2024-08-14 LAB — VITAMIN B1: Thiamine: 80.1 nmol/L (ref 66.5–200.0)

## 2024-08-14 LAB — VITAMIN B12: Vitamin B-12: 374 pg/mL (ref 232–1245)

## 2024-08-17 NOTE — Addendum Note (Signed)
 Addended by: Sonita Michiels on: 08/17/2024 08:18 AM   Modules accepted: Orders

## 2024-08-25 NOTE — Progress Notes (Signed)
 Todd Hayes                                          MRN: [id number]   08/25/2024   The [facility] Quality Team Specialist reviewed this patient medical record for the purposes of chart review for care gap closure. The following were reviewed: chart review for care gap closure-colorectal cancer screening.    [facility] Quality Team

## 2024-09-06 ENCOUNTER — Other Ambulatory Visit: Payer: Self-pay | Admitting: Family Medicine

## 2024-09-06 DIAGNOSIS — M4716 Other spondylosis with myelopathy, lumbar region: Secondary | ICD-10-CM

## 2024-09-09 ENCOUNTER — Other Ambulatory Visit: Payer: Self-pay | Admitting: Family Medicine

## 2024-09-09 DIAGNOSIS — G2581 Restless legs syndrome: Secondary | ICD-10-CM

## 2024-09-09 DIAGNOSIS — G894 Chronic pain syndrome: Secondary | ICD-10-CM

## 2024-09-09 DIAGNOSIS — K219 Gastro-esophageal reflux disease without esophagitis: Secondary | ICD-10-CM

## 2024-09-24 ENCOUNTER — Other Ambulatory Visit: Payer: Self-pay

## 2024-09-24 ENCOUNTER — Other Ambulatory Visit (HOSPITAL_BASED_OUTPATIENT_CLINIC_OR_DEPARTMENT_OTHER): Payer: Self-pay | Admitting: Family Medicine

## 2024-09-24 DIAGNOSIS — G894 Chronic pain syndrome: Secondary | ICD-10-CM

## 2024-09-24 MED ORDER — BACLOFEN 20 MG PO TABS: 20.0000 mg | ORAL_TABLET | Freq: Three times a day (TID) | ORAL | 0 refills | Status: DC | PRN

## 2024-09-24 NOTE — Telephone Encounter (Signed)
 Patient has called nurse line multiple times in regards to refill.  I see the medication refill was denied.   Does the patient need an apt?

## 2024-10-09 ENCOUNTER — Other Ambulatory Visit: Payer: Self-pay | Admitting: Family Medicine

## 2024-10-09 DIAGNOSIS — I10 Essential (primary) hypertension: Secondary | ICD-10-CM

## 2024-10-16 ENCOUNTER — Other Ambulatory Visit: Payer: Self-pay | Admitting: Family Medicine

## 2024-10-16 DIAGNOSIS — G894 Chronic pain syndrome: Secondary | ICD-10-CM

## 2024-11-02 ENCOUNTER — Other Ambulatory Visit: Payer: Self-pay | Admitting: Family Medicine

## 2024-11-02 DIAGNOSIS — G4709 Other insomnia: Secondary | ICD-10-CM

## 2024-11-02 MED ORDER — TRAZODONE HCL 150 MG PO TABS: 300.0000 mg | ORAL_TABLET | Freq: Every day | ORAL | 1 refills | Status: AC

## 2024-12-04 ENCOUNTER — Other Ambulatory Visit: Payer: Self-pay | Admitting: Family Medicine

## 2024-12-04 DIAGNOSIS — M4716 Other spondylosis with myelopathy, lumbar region: Secondary | ICD-10-CM

## 2024-12-08 ENCOUNTER — Other Ambulatory Visit: Payer: Self-pay | Admitting: Family Medicine

## 2024-12-08 DIAGNOSIS — K219 Gastro-esophageal reflux disease without esophagitis: Secondary | ICD-10-CM

## 2024-12-08 DIAGNOSIS — G2581 Restless legs syndrome: Secondary | ICD-10-CM
